# Patient Record
Sex: Male | Born: 1935 | Race: White | Hispanic: No | Marital: Married | Smoking: Never smoker
Health system: Southern US, Community
[De-identification: ages and names within clinical notes are randomized; demographics above are authoritative.]

## PROBLEM LIST (undated history)

## (undated) DIAGNOSIS — M5430 Sciatica, unspecified side: Secondary | ICD-10-CM

## (undated) DIAGNOSIS — E119 Type 2 diabetes mellitus without complications: Secondary | ICD-10-CM

## (undated) DIAGNOSIS — R5383 Other fatigue: Secondary | ICD-10-CM

## (undated) DIAGNOSIS — I493 Ventricular premature depolarization: Secondary | ICD-10-CM

## (undated) DIAGNOSIS — Z92241 Personal history of systemic steroid therapy: Secondary | ICD-10-CM

## (undated) DIAGNOSIS — IMO0002 Reserved for concepts with insufficient information to code with codable children: Secondary | ICD-10-CM

## (undated) DIAGNOSIS — I2510 Atherosclerotic heart disease of native coronary artery without angina pectoris: Secondary | ICD-10-CM

## (undated) DIAGNOSIS — I10 Essential (primary) hypertension: Secondary | ICD-10-CM

## (undated) DIAGNOSIS — N210 Calculus in bladder: Secondary | ICD-10-CM

## (undated) DIAGNOSIS — I359 Nonrheumatic aortic valve disorder, unspecified: Secondary | ICD-10-CM

## (undated) DIAGNOSIS — R011 Cardiac murmur, unspecified: Secondary | ICD-10-CM

## (undated) DIAGNOSIS — F419 Anxiety disorder, unspecified: Secondary | ICD-10-CM

## (undated) DIAGNOSIS — D649 Anemia, unspecified: Secondary | ICD-10-CM

## (undated) DIAGNOSIS — G8929 Other chronic pain: Secondary | ICD-10-CM

## (undated) DIAGNOSIS — N400 Enlarged prostate without lower urinary tract symptoms: Secondary | ICD-10-CM

## (undated) DIAGNOSIS — E785 Hyperlipidemia, unspecified: Secondary | ICD-10-CM

## (undated) DIAGNOSIS — M545 Low back pain, unspecified: Secondary | ICD-10-CM

## (undated) DIAGNOSIS — M069 Rheumatoid arthritis, unspecified: Secondary | ICD-10-CM

## (undated) HISTORY — DX: Hyperlipidemia, unspecified: E78.5

## (undated) HISTORY — PX: HAMMER TOE SURGERY: SHX385

## (undated) HISTORY — DX: Reserved for concepts with insufficient information to code with codable children: IMO0002

## (undated) HISTORY — DX: Ventricular premature depolarization: I49.3

## (undated) HISTORY — DX: Other fatigue: R53.83

## (undated) HISTORY — PX: TRANSTHORACIC ECHOCARDIOGRAM: SHX275

## (undated) HISTORY — DX: Nonrheumatic aortic valve disorder, unspecified: I35.9

## (undated) HISTORY — DX: Sciatica, unspecified side: M54.30

## (undated) HISTORY — DX: Essential (primary) hypertension: I10

## (undated) HISTORY — DX: Atherosclerotic heart disease of native coronary artery without angina pectoris: I25.10

---

## 1998-12-29 ENCOUNTER — Encounter: Payer: Self-pay | Admitting: *Deleted

## 1998-12-29 ENCOUNTER — Ambulatory Visit (HOSPITAL_COMMUNITY): Admission: RE | Admit: 1998-12-29 | Discharge: 1998-12-29 | Payer: Self-pay | Admitting: *Deleted

## 1999-08-15 HISTORY — PX: RETINAL DETACHMENT SURGERY: SHX105

## 2000-12-06 ENCOUNTER — Encounter: Admission: RE | Admit: 2000-12-06 | Discharge: 2000-12-06 | Payer: Self-pay | Admitting: Family Medicine

## 2001-09-05 ENCOUNTER — Ambulatory Visit (HOSPITAL_COMMUNITY): Admission: RE | Admit: 2001-09-05 | Discharge: 2001-09-05 | Payer: Self-pay | Admitting: Gastroenterology

## 2001-09-11 ENCOUNTER — Ambulatory Visit (HOSPITAL_BASED_OUTPATIENT_CLINIC_OR_DEPARTMENT_OTHER): Admission: RE | Admit: 2001-09-11 | Discharge: 2001-09-11 | Payer: Self-pay | Admitting: Urology

## 2001-09-11 HISTORY — PX: OTHER SURGICAL HISTORY: SHX169

## 2002-06-05 ENCOUNTER — Ambulatory Visit (HOSPITAL_COMMUNITY): Admission: RE | Admit: 2002-06-05 | Discharge: 2002-06-05 | Payer: Self-pay | Admitting: Orthopedic Surgery

## 2002-06-05 ENCOUNTER — Encounter: Payer: Self-pay | Admitting: Orthopedic Surgery

## 2003-08-15 HISTORY — PX: CARDIAC CATHETERIZATION: SHX172

## 2003-12-23 ENCOUNTER — Inpatient Hospital Stay (HOSPITAL_BASED_OUTPATIENT_CLINIC_OR_DEPARTMENT_OTHER): Admission: RE | Admit: 2003-12-23 | Discharge: 2003-12-23 | Payer: Self-pay | Admitting: Cardiology

## 2004-02-16 ENCOUNTER — Encounter: Admission: RE | Admit: 2004-02-16 | Discharge: 2004-02-16 | Payer: Self-pay | Admitting: Family Medicine

## 2004-11-11 ENCOUNTER — Ambulatory Visit (HOSPITAL_COMMUNITY): Admission: RE | Admit: 2004-11-11 | Discharge: 2004-11-11 | Payer: Self-pay | Admitting: Orthopedic Surgery

## 2006-09-24 ENCOUNTER — Ambulatory Visit (HOSPITAL_BASED_OUTPATIENT_CLINIC_OR_DEPARTMENT_OTHER): Admission: RE | Admit: 2006-09-24 | Discharge: 2006-09-24 | Payer: Self-pay | Admitting: Orthopedic Surgery

## 2006-09-24 HISTORY — PX: CARPAL TUNNEL RELEASE: SHX101

## 2006-11-07 ENCOUNTER — Ambulatory Visit (HOSPITAL_COMMUNITY): Admission: RE | Admit: 2006-11-07 | Discharge: 2006-11-07 | Payer: Self-pay | Admitting: Orthopedic Surgery

## 2007-11-17 HISTORY — PX: OTHER SURGICAL HISTORY: SHX169

## 2007-12-05 ENCOUNTER — Inpatient Hospital Stay (HOSPITAL_COMMUNITY): Admission: EM | Admit: 2007-12-05 | Discharge: 2007-12-10 | Payer: Self-pay | Admitting: Emergency Medicine

## 2008-08-26 ENCOUNTER — Ambulatory Visit (HOSPITAL_COMMUNITY): Admission: RE | Admit: 2008-08-26 | Discharge: 2008-08-26 | Payer: Self-pay | Admitting: Orthopedic Surgery

## 2008-10-07 ENCOUNTER — Inpatient Hospital Stay (HOSPITAL_COMMUNITY): Admission: RE | Admit: 2008-10-07 | Discharge: 2008-10-12 | Payer: Self-pay | Admitting: Orthopedic Surgery

## 2008-10-07 HISTORY — PX: LUMBAR SPINE SURGERY: SHX701

## 2008-12-15 IMAGING — CR DG KNEE COMPLETE 4+V*R*
4 series · 4 of 4 positions shown · non-contrast
Comparison: left knee exam, same date

CLINICAL DATA: Knee pain, recent fall

RIGHT KNEE - COMPLETE 4+ VIEW

[t knee ap right]
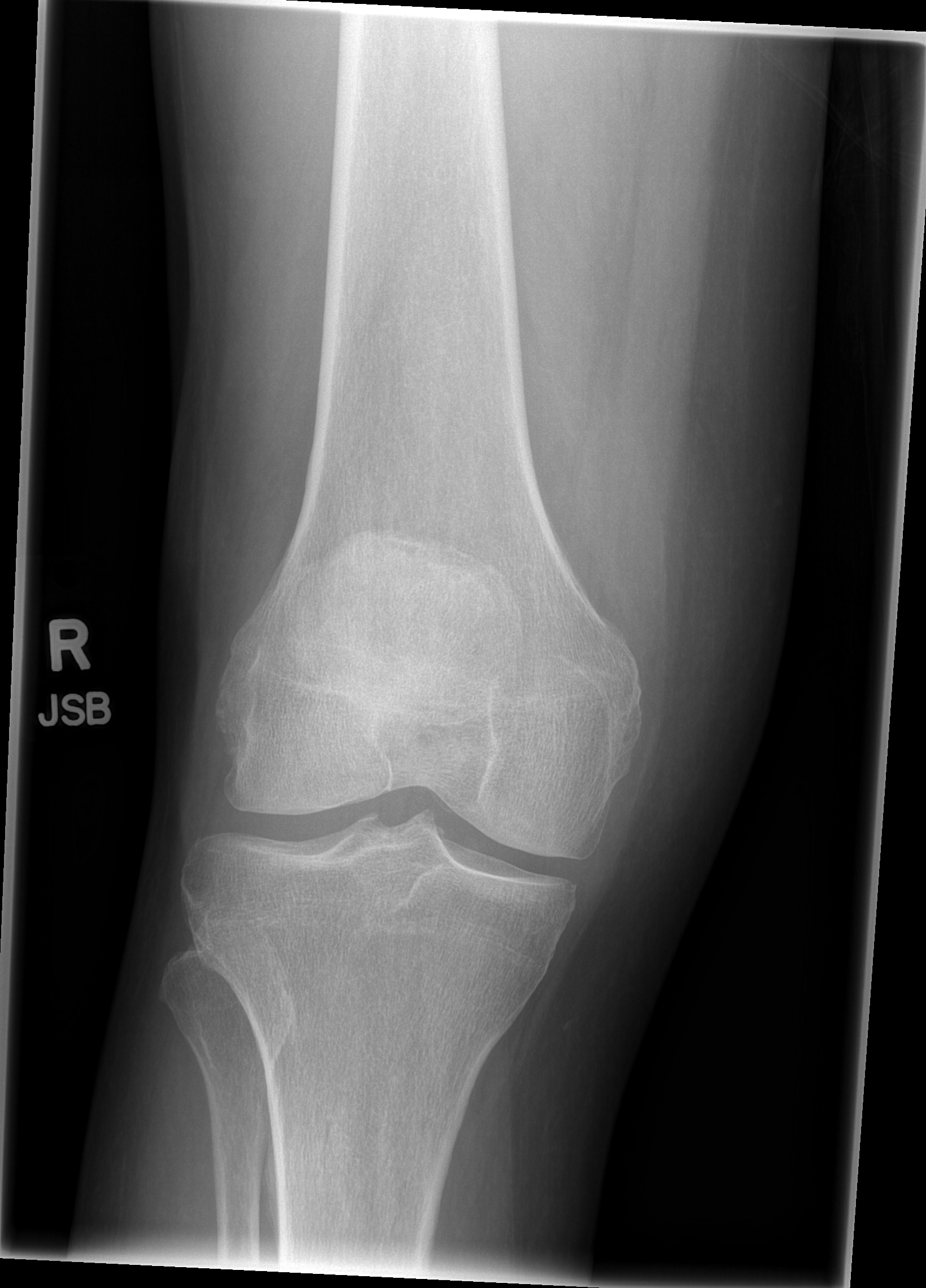

[t knee oblique right (1 of 2)]
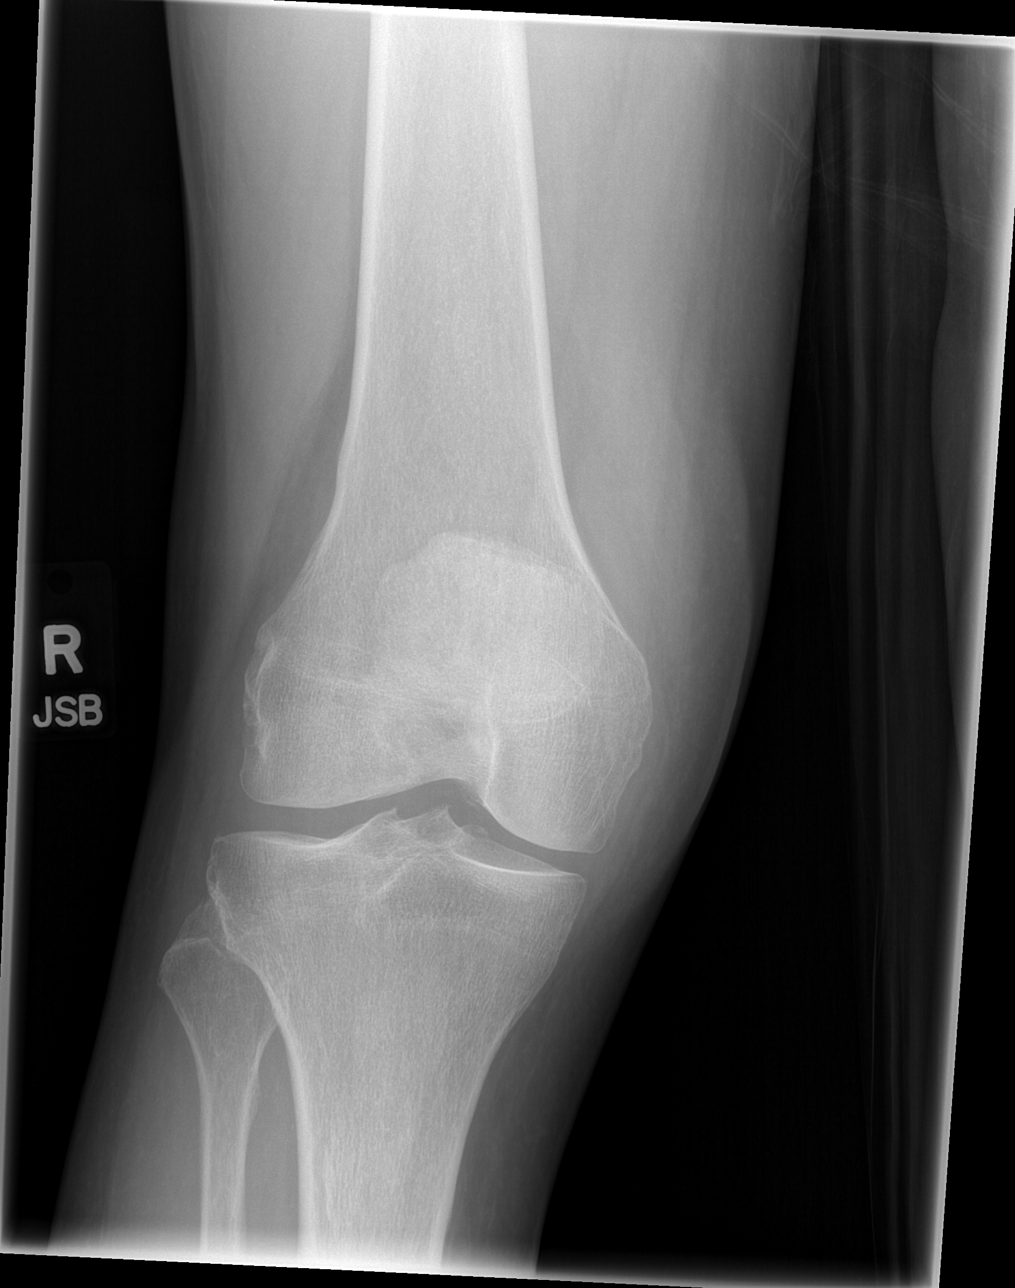

[t knee oblique right (2 of 2)]
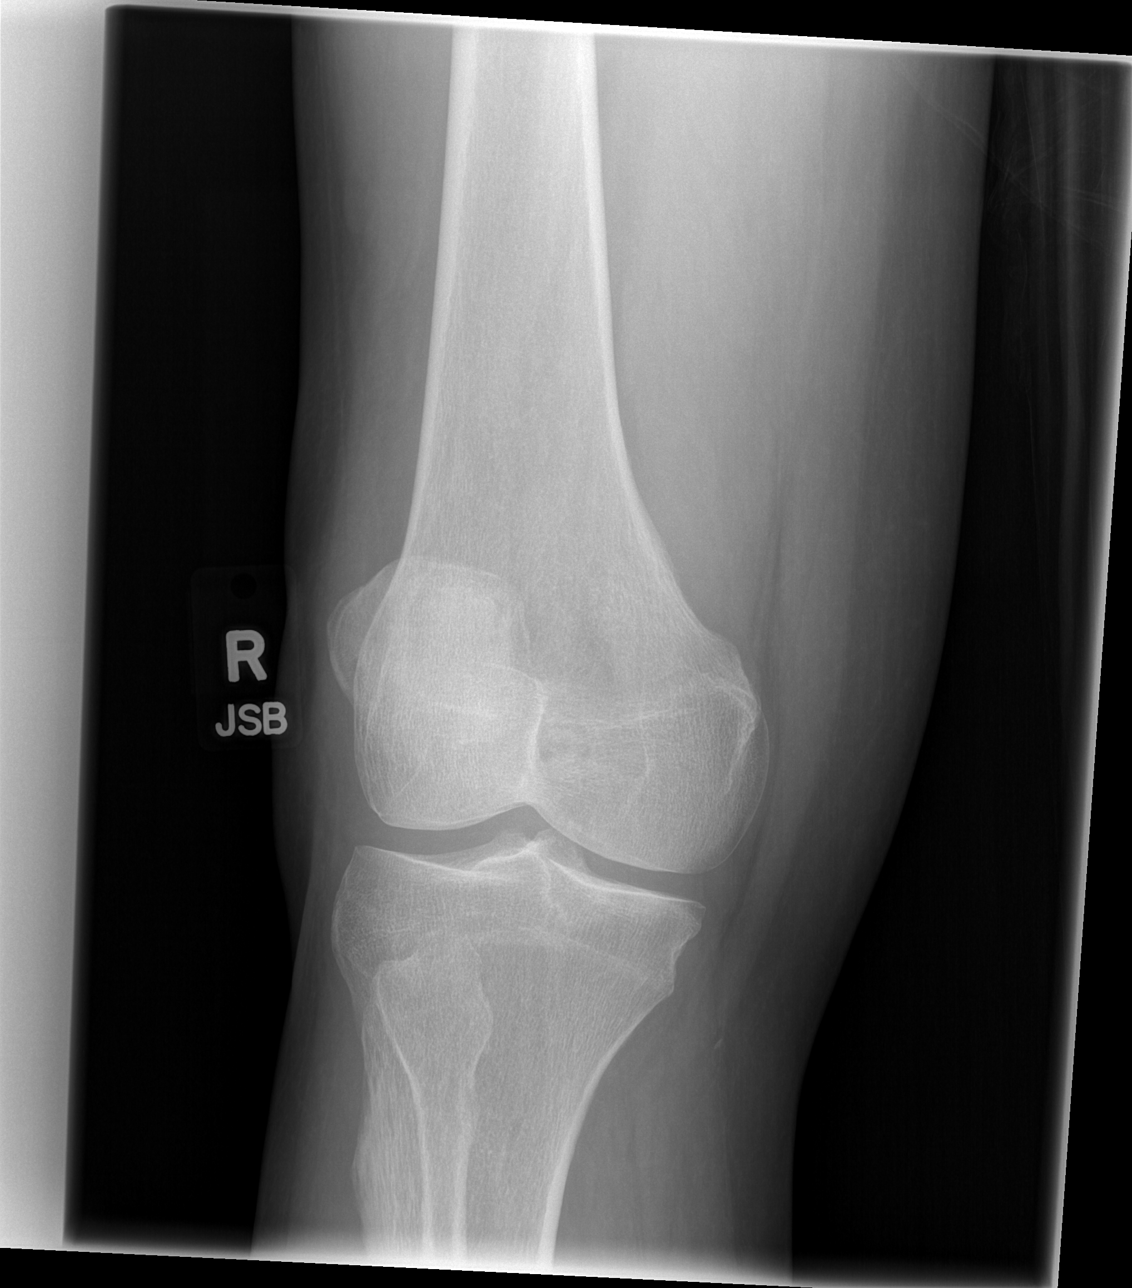

[t knee lat right]
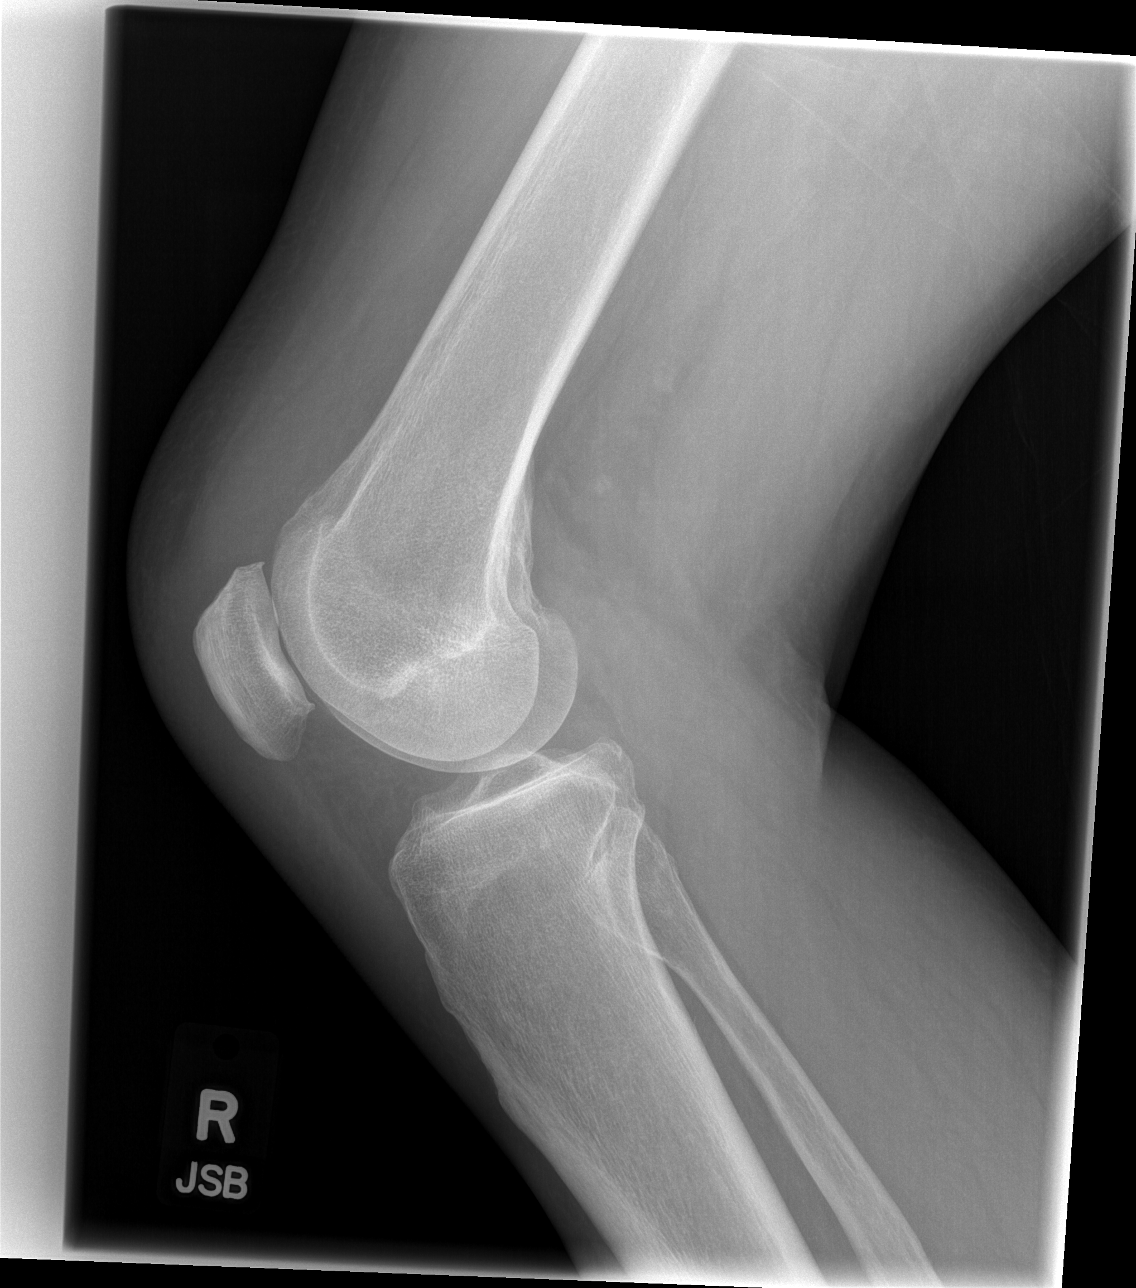

[4 of 4 positions shown; findings below may reference images not displayed]

FINDINGS: Normal alignment without displaced fracture.  Lateral
view also demonstrates prepatellar anterior knee soft tissue
swelling.  A small joint effusion is not entirely excluded.
IMPRESSION: No acute fracture.

Prepatellar anterior knee soft tissue swelling.

## 2008-12-15 IMAGING — CR DG KNEE COMPLETE 4+V*L*
4 series · 4 of 4 positions shown · non-contrast
Comparison: None.

CLINICAL DATA: Knee pain, recent fall

LEFT KNEE - COMPLETE 4+ VIEW

[t knee ap left]
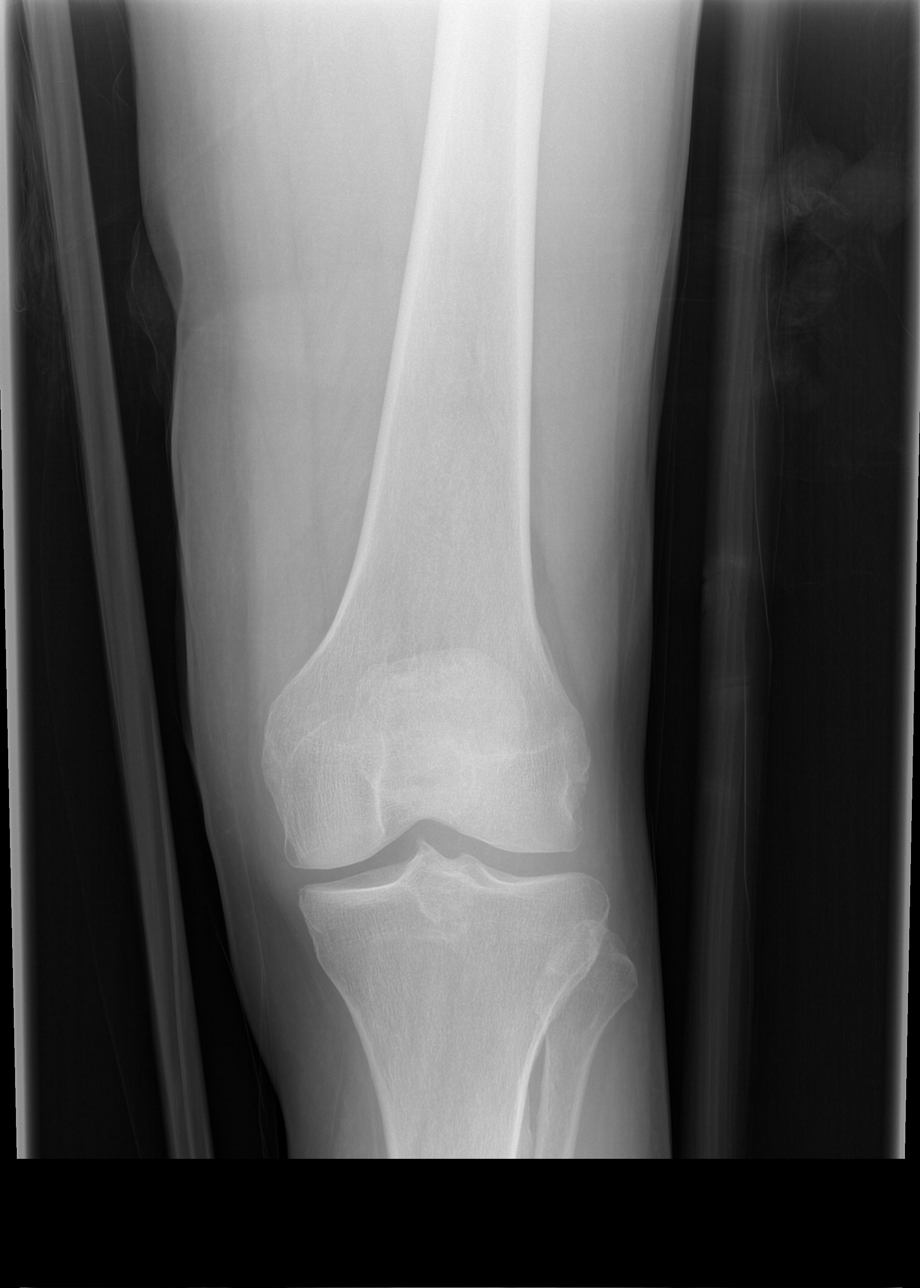

[t knee oblique left (1 of 2)]
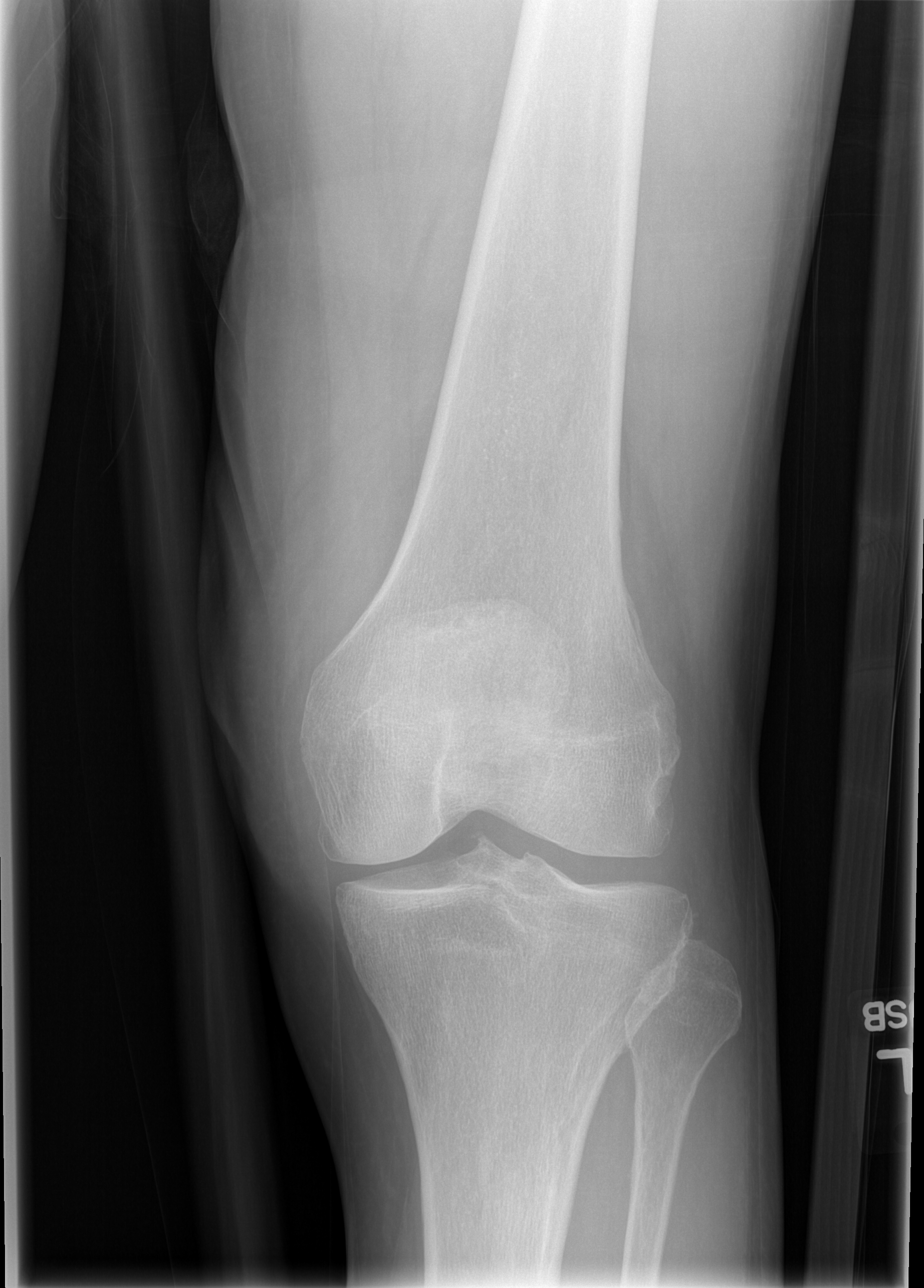

[t knee oblique left (2 of 2)]
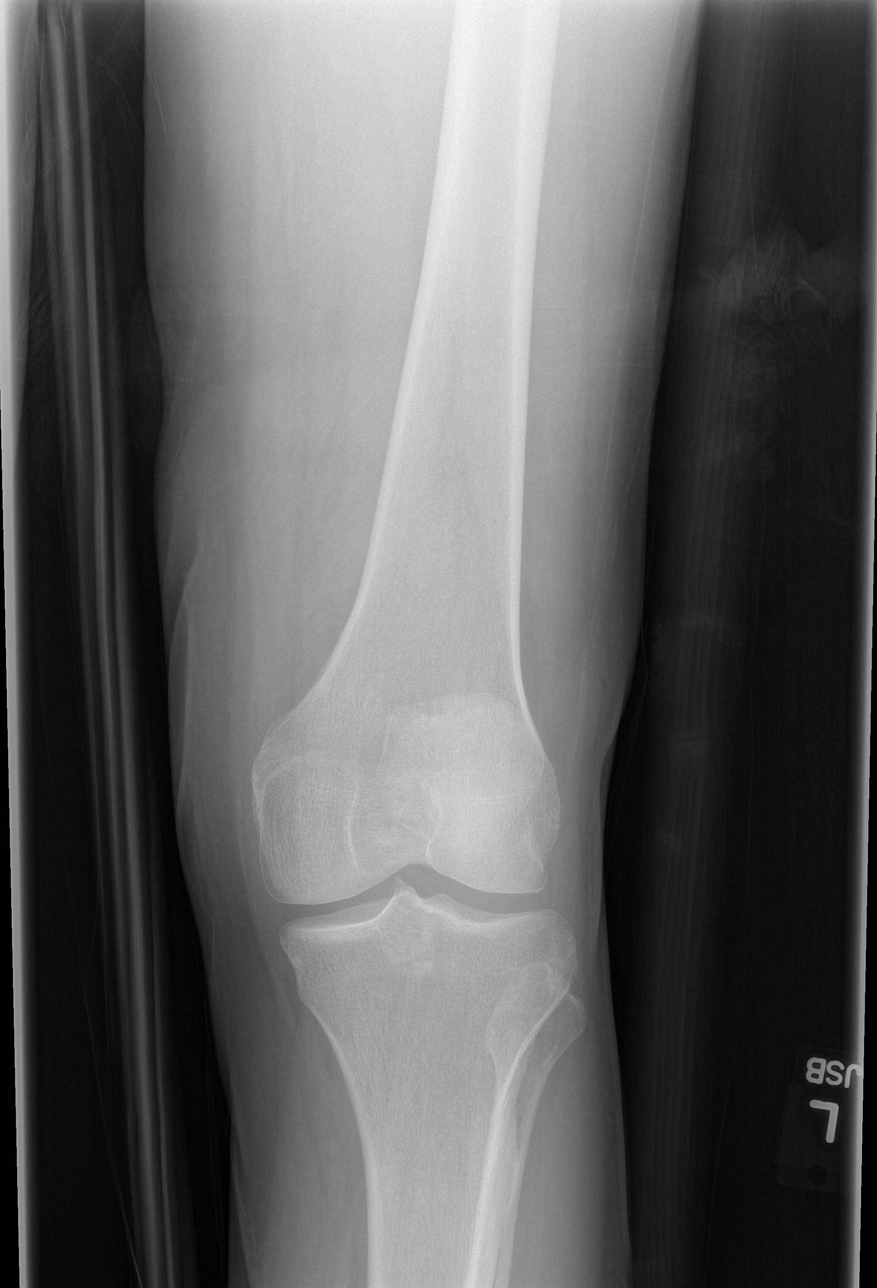

[t knee lat left]
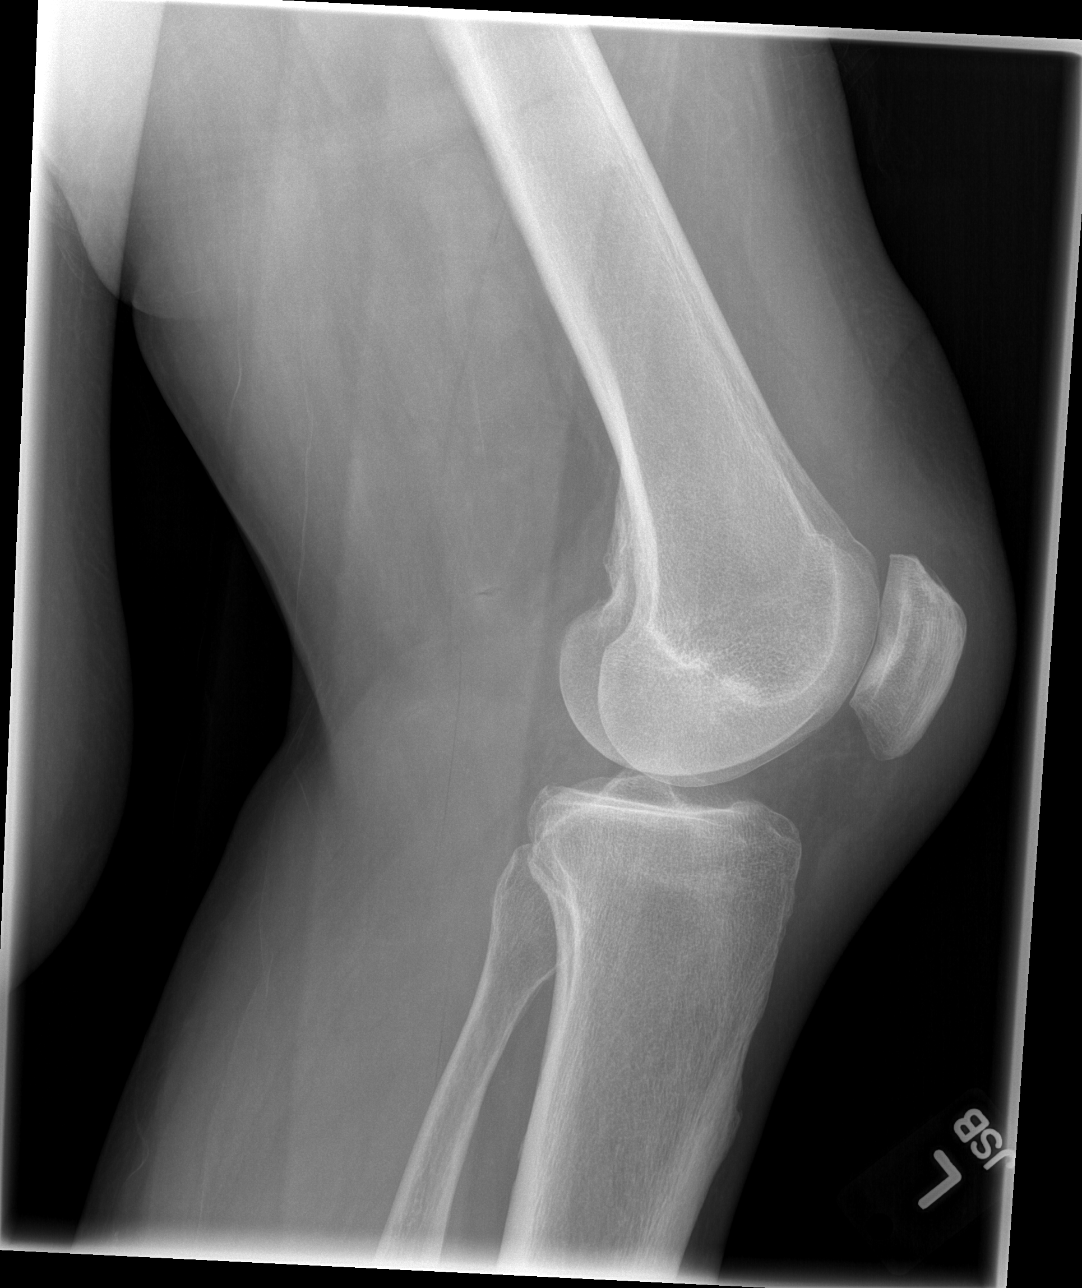

[4 of 4 positions shown; findings below may reference images not displayed]

FINDINGS: Normal alignment without fracture.  Lateral view
demonstrates prepatellar soft tissue swelling.  A small joint
effusion is not entirely excluded.
IMPRESSION: No acute bony abnormality.

Question small joint effusion

Prepatellar anterior soft tissue swelling.

## 2008-12-15 IMAGING — CR DG CHEST 1V PORT
1 series · 1 of 1 positions shown · non-contrast
Comparison: The chest 02/16/2004

CLINICAL DATA: Preop tendon repair

PORTABLE CHEST - 1 VIEW

[view not recorded]
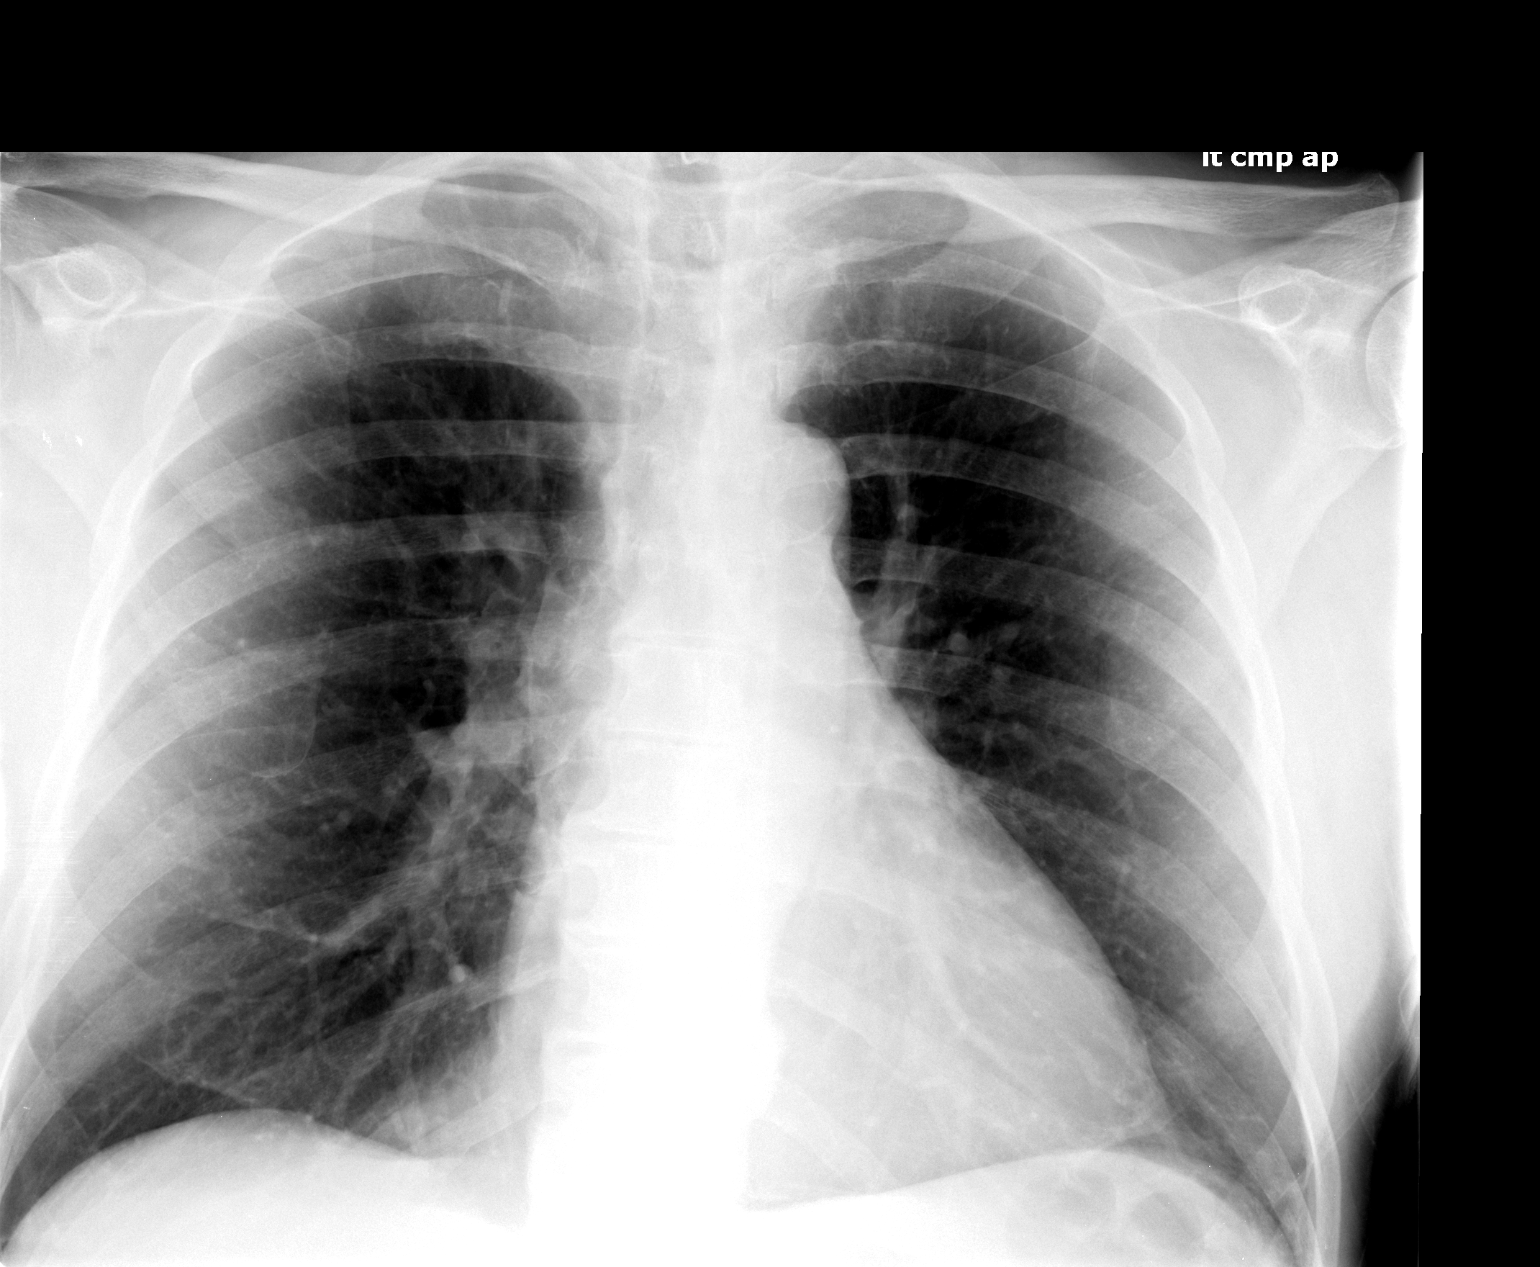

[1 of 1 positions shown; findings below may reference images not displayed]

FINDINGS: Heart size is upper normal.  Negative for heart failure.
There is no infiltrate, effusion, or mass lesion.  There are mild
changes of COPD.
IMPRESSION: No active cardiopulmonary disease.

## 2010-05-20 ENCOUNTER — Ambulatory Visit: Payer: Self-pay | Admitting: Cardiology

## 2010-05-20 ENCOUNTER — Ambulatory Visit (HOSPITAL_COMMUNITY): Admission: RE | Admit: 2010-05-20 | Discharge: 2010-05-20 | Payer: Self-pay | Admitting: Orthopedic Surgery

## 2010-05-24 ENCOUNTER — Ambulatory Visit: Payer: Self-pay | Admitting: Cardiology

## 2010-09-04 ENCOUNTER — Encounter: Payer: Self-pay | Admitting: Family Medicine

## 2010-09-17 ENCOUNTER — Observation Stay (HOSPITAL_COMMUNITY)
Admission: EM | Admit: 2010-09-17 | Discharge: 2010-09-18 | Disposition: A | Discharge status: Home / Self Care | Payer: Medicare Other | Attending: Orthopedic Surgery | Admitting: Orthopedic Surgery

## 2010-09-17 ENCOUNTER — Emergency Department (HOSPITAL_COMMUNITY): Payer: Medicare Other

## 2010-09-17 DIAGNOSIS — S61209A Unspecified open wound of unspecified finger without damage to nail, initial encounter: Secondary | ICD-10-CM | POA: Insufficient documentation

## 2010-09-17 DIAGNOSIS — IMO0002 Reserved for concepts with insufficient information to code with codable children: Secondary | ICD-10-CM | POA: Insufficient documentation

## 2010-09-17 DIAGNOSIS — I10 Essential (primary) hypertension: Secondary | ICD-10-CM | POA: Insufficient documentation

## 2010-09-17 DIAGNOSIS — W312XXA Contact with powered woodworking and forming machines, initial encounter: Secondary | ICD-10-CM | POA: Insufficient documentation

## 2010-09-17 DIAGNOSIS — G8929 Other chronic pain: Secondary | ICD-10-CM | POA: Insufficient documentation

## 2010-09-17 DIAGNOSIS — Z79899 Other long term (current) drug therapy: Secondary | ICD-10-CM | POA: Insufficient documentation

## 2010-09-17 DIAGNOSIS — Z23 Encounter for immunization: Secondary | ICD-10-CM | POA: Insufficient documentation

## 2010-09-17 DIAGNOSIS — M069 Rheumatoid arthritis, unspecified: Secondary | ICD-10-CM | POA: Insufficient documentation

## 2010-09-17 DIAGNOSIS — E119 Type 2 diabetes mellitus without complications: Secondary | ICD-10-CM | POA: Insufficient documentation

## 2010-09-17 LAB — BASIC METABOLIC PANEL
Calcium: 9 mg/dL (ref 8.4–10.5)
Creatinine, Ser: 0.79 mg/dL (ref 0.4–1.5)
GFR calc Af Amer: >60 mL/min (ref >60)
GFR calc non Af Amer: >60 mL/min (ref >60)
Glucose, Bld: 123 mg/dL — ABNORMAL HIGH (ref 70–99)
Sodium: 136 mEq/L (ref 135–145)

## 2010-09-17 LAB — CBC
MCH: 29.5 pg (ref 26.0–34.0)
MCHC: 34 g/dL (ref 30.0–36.0)
RDW: 12.5 % (ref 11.5–15.5)

## 2010-09-18 HISTORY — PX: OTHER SURGICAL HISTORY: SHX169

## 2010-09-18 LAB — GLUCOSE, CAPILLARY: Glucose-Capillary: 102 mg/dL — ABNORMAL HIGH (ref 70–99)

## 2010-09-25 NOTE — Op Note (Addendum)
NAMEKEARY, [name]                 ACCOUNT NO.:  [id number]  MEDICAL RECORD NO.:  [id number]           PATIENT TYPE:  I  LOCATION:  2550                         FACILITY:  [facility]  PHYSICIAN:  Betha Loa, MD        DATE OF BIRTH:  02/06/1936  DATE OF PROCEDURE:  09/18/2010 DATE OF DISCHARGE:  09/18/2010                              OPERATIVE REPORT   PREOPERATIVE DIAGNOSIS:  Left index and long finger table saw injuries.  POSTOPERATIVE DIAGNOSES:  Left index finger nail bed laceration, left long finger open P2 fracture, and extensor tendon laceration.  PROCEDURES:   1. Irrigation and debridement of index and long finger lacerations 2. Irrigation and debridement of open long finger middle phalanx fracture 3. Repair of long finger extensor tendon laceration zone 2 4. Percutaneous pinning of distal interphalangeal joint and middle phalanx fracture 5. Repair of index finger nail bed laceration.  SURGEON:  Betha Loa, MD  ASSISTANT:  None.  ANESTHESIA:  General.  INTRAVENOUS FLUIDS:  Per anesthesia flow sheet.  ESTIMATED BLOOD LOSS:  Minimal.  COMPLICATIONS:  None.  SPECIMENS:  None.  TOURNIQUET TIME:  62 minutes.  DISPOSITION:  Stable to PACU.  INDICATIONS:  [name] is a 75 year old right-hand-dominant white male who was using a table saw approximately 8:30 on September 17, 2010, when his hand was kicked into the blade.  He had lacerations to the index and long finger.  He was brought to the [facility] Emergency Department where he was evaluated.  Radiographs were taken revealing a fracture of the long finger middle phalanx.  I was consulted for management of the injury.  On examination, he had intact sensation and capillary refill on all fingertips.  He was unable to extend at the DIP joint and long finger.  He noted pain in the index finger and there was laceration at the dorsal aspect of the nail fold as well as at the tip of the finger. I recommended to Mr.  [name] going to the operating room for irrigation and debridement of his open fracture and repair of the extensor tendon. Risks, benefits, and alternatives of surgery were discussed including the risks of blood loss, infection, damage to nerves, vessels, tendons, ligaments, and bone, failure of surgery, the need for additional surgery, complications with wound healing, continued pain, nonunion, malunion, and stiffness.  He voiced understanding these risks and elected to proceed.  OPERATIVE COURSE:  After being identified preoperatively by myself, the patient and I agreed upon the procedure and site of procedure.  The surgical site was marked.  The risks, benefits, and alternatives of surgery were reviewed and he wished to proceed.  Surgical consent had been signed.  He had been given 1 g of IV Ancef in the emergency department.  He was transferred to the operating room and placed on the operating table in supine position with the left upper extremity on an arm board.  General anesthesia was induced by the anesthesiologist.  The left upper extremity was prepped and draped in a normal sterile orthopedic fashion.  A surgical pause was performed between surgeons, Anesthesia, and  operating staff and all were in agreement as to the patient, procedure, and site of procedure.  Tourniquet on the proximal aspect of the extremity was inflated to 250 mmHg after exsanguination of the limb with an Esmarch bandage.  The wounds were explored.  There was noted to be a nail bed laceration at the distal aspect of the nail bed of the index finger.  The ulnar side was a little bit damaged as well. The ring finger had a very superficial laceration on it.  The laceration on the long finger went down into the bone.  It was cleared of clot and hematoma.  Volar cortex of the middle phalanx was noted to be intact. The extensor tendon was 100% lacerated.  The neurovascular structure was outside the zone of injury on  the ulnar side.  On the radial side, it was very close.  The radial digital nerve and artery were identified and were noted to be intact.  The wounds were all copiously irrigated with 1000 mL of sterile saline by bulb syringe.  There was no gross contamination.  The DIP joint of the long finger was pinned with a 0.45- inch K-wire to take tension off the tensor tendon repair.  C-arm was used in AP and lateral projections to ensure appropriate placement of the pin which was the case.  The extensor tendon was then repaired with 4-0 Mersilene suture in a figure-of-eight fashion.  This was adequate to oppose the tendon edges.  The wound had been extended proximally on the radial side and distally on the ulnar side to gain exposure.  The nail was removed from the index finger.  There was noted to be laceration of the nail bed at the distal radial edge, but not anymore proximally.  A 6- 0 chromic gut suture was used to repair the nail bed laceration and the small portion of skin laceration at the tip of the finger.  The wound on the long finger was repaired with 5-0 nylon in a horizontal mattress fashion.  A piece of Xeroform was placed in the nail fold of the index finger.  The pin site was dressed with a Xeroform.  The pin had been bent and cut short and pin cap placed.  The wounds were all dressed with sterile Xeroform and 4 x 4's and wrapped with a Kerlix bandage.  A volar and dorsal splint was placed with the index, long, and ring fingers included.  The MPs were flex and the IPs were extended.  This was wrapped with Kerlix and Ace bandage.  The tourniquet was deflated at 62 minutes.  The fingertips were all pink with brisk capillary refill after deflation of the tourniquet.  The operative drapes were broken down and the patient was awoken from anesthesia safely.  He was transferred back to the stretcher and taken to PACU in stable condition.  He will get one more dose of IV Ancef in the  recovery area and he will be discharged home with a prescription for Percocet and doxycycline.  I will see him back in the office in 1 week for postoperative followup.     Betha Loa, MD     KK/MEDQ  D:  09/18/2010  T:  09/19/2010  Job:  [id number]  Electronically Signed by Betha Loa  on 09/22/2010 07:29:44 PM

## 2010-09-25 NOTE — H&P (Signed)
[name], [name]                 ACCOUNT NO.:  [id number]  MEDICAL RECORD NO.:  [id number]           PATIENT TYPE:  I  LOCATION:  2550                         FACILITY:  [facility]  PHYSICIAN:  Betha Loa, MD        DATE OF BIRTH:  Mar 25, 1936  DATE OF ADMISSION:  09/17/2010 DATE OF DISCHARGE:  09/18/2010                             HISTORY & PHYSICAL   CHIEF COMPLAINT:  Table saw injury, left hand.  HISTORY OF PRESENT ILLNESS:  Mr. Elmquist is a 75 year old right-hand- dominant white male who states he was using a table saw approximately 8:30 p.m. today when his left hand was knocked into the blade.  He thinks the left long finger hit the blade and the index finger was hit by the piece of wood.  He had lacerations to the dorsum of both fingers. He is unable to extend at the DIP joint of the long finger.  He came to [facility] Emergency Department where he was evaluated.  I was consulted for management of the injury.  He reports no other injuries and no previous injuries.  He states the index finger hurts more than the long finger.  His tetanus was updated and he was given a gram of IV Ancef by the emergency department staff.  ALLERGIES:  SULFA DRUGS caused kidney problems when he was a child.  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  PAST SURGICAL HISTORY:  Hydrocele repair, retinal detachment, spinal decompression of L-spine, carpal tunnel release on the right hand, right foot toe surgery, and left knee quad repair.  MEDICATIONS:  Cymbalta, leflunomide, metformin, Norvasc, Vicodin, fentanyl, lisinopril, and prednisone.  FAMILY HISTORY:  Negative.  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  He does not smoke and does not use alcohol.  REVIEW OF SYSTEMS:  A 13-point review of systems is negative.  PHYSICAL EXAMINATION:  VITAL SIGNS:  Temperature 98.2, pulse  67, respirations 20, and BP 190/80. GENERAL:  Alert and oriented x3. HEAD:  Normocephalic and atraumatic. NECK:  Supple.  Full range of motion. CHEST:  Regular rate and rhythm. LUNGS:  Clear to auscultation bilaterally. ABDOMEN:  Nontender and nondistended. EXTREMITIES:  Bilateral upper extremities are distally neurovascularly intact in radial, median, and ulnar nerve distributions.  Light touch sensation is intact and he has good capillary refill.  He can flex and extend the IP joints of his thumbs and can cross his fingers.  The right upper extremity has no wounds and no tenderness to palpation.  In the left upper extremity with the exception of the index and long finger, he has no wounds and no tenderness to palpation.  In the index finger, he has intact sensation and capillary refill in both the radial and ulnar side of the fingertip.  There are lacerations in the dorsal aspect of the distal phalanx over the nail fold and at the tip.  He has active flexion/extension of the DIP joint of the index finger.  In the long finger, he has intact sensation and capillary  fill on both the radial and ulnar sides of the finger.  There is good capillary refill under the nail.  There is a laceration just proximal to the DIP joint.  He is unable to extend at the DIP joint.  He has active flexion at the DIP and PIP joints.  RADIOGRAPHS:  AP, lateral, and oblique views of left hand show bone loss at the dorsal aspect of the distal middle phalanx of the long finger. It appears that the volar cortex may still be intact.  No other fractures, dislocations, or radiopaque foreign bodies are noted.  LAB RESULTS:  White blood count 6.8, hemoglobin 14.4, hematocrit 42.3, and platelets 224.  ASSESSMENT/PLAN:  Table saw injuries to left long and index finger with apparent laceration to the dorsal index finger and laceration of the skin and extensor tendon of the long finger with loss of dorsal cortex of  the distal aspect of the middle phalanx.  I discussed with Mr. Urwin and his wife the nature of his injuries.  I recommended going to the operating room for irrigation and debridement of his wounds and the open fracture.  We discussed that he may require percutaneous pinning of the fingers.  Will require extensor tendon repair which could require rotation of tissue or a graft in the future.  We will also have to keep an eye on the bone loss and decide whether to bone graft this in the future once we are sure that there is no infection in the tissues. Risks, benefits, and alternatives of surgery were discussed including the risk of blood loss, infection, damage to nerves, vessels, tendons, ligaments, and bone, failure of procedure, need for additional procedures, complications with wound healing, continued pain, and stiffness.  He voiced these risks and elected to proceed.  We will have the surgery arranged as soon as possible.     Betha Loa, MD     KK/MEDQ  D:  09/17/2010  T:  09/18/2010  Job:  [id number]  Electronically Signed by Betha Loa  on 09/22/2010 07:26:45 PM

## 2010-09-30 ENCOUNTER — Other Ambulatory Visit (INDEPENDENT_AMBULATORY_CARE_PROVIDER_SITE_OTHER): Payer: Medicare Other

## 2010-09-30 DIAGNOSIS — Z79899 Other long term (current) drug therapy: Secondary | ICD-10-CM

## 2010-09-30 DIAGNOSIS — E119 Type 2 diabetes mellitus without complications: Secondary | ICD-10-CM

## 2010-10-05 ENCOUNTER — Ambulatory Visit (INDEPENDENT_AMBULATORY_CARE_PROVIDER_SITE_OTHER): Payer: Medicare Other | Admitting: Cardiology

## 2010-10-05 DIAGNOSIS — M069 Rheumatoid arthritis, unspecified: Secondary | ICD-10-CM

## 2010-10-05 DIAGNOSIS — E119 Type 2 diabetes mellitus without complications: Secondary | ICD-10-CM

## 2010-10-05 DIAGNOSIS — I10 Essential (primary) hypertension: Secondary | ICD-10-CM

## 2010-10-05 DIAGNOSIS — E7800 Pure hypercholesterolemia, unspecified: Secondary | ICD-10-CM

## 2010-11-14 ENCOUNTER — Other Ambulatory Visit: Payer: Self-pay | Admitting: Cardiology

## 2010-11-14 DIAGNOSIS — E119 Type 2 diabetes mellitus without complications: Secondary | ICD-10-CM

## 2010-11-29 LAB — BASIC METABOLIC PANEL
CO2: 30 mEq/L (ref 19–32)
Calcium: 9.3 mg/dL (ref 8.4–10.5)
Creatinine, Ser: 1.2 mg/dL (ref 0.4–1.5)
GFR calc Af Amer: >60 mL/min (ref >60)
GFR calc non Af Amer: 60 mL/min — ABNORMAL LOW (ref >60)
Glucose, Bld: 173 mg/dL — ABNORMAL HIGH (ref 70–99)
Sodium: 142 mEq/L (ref 135–145)

## 2010-11-29 LAB — CBC
HCT: 32.9 % — ABNORMAL LOW (ref 39.0–52.0)
MCHC: 33.8 g/dL (ref 30.0–36.0)
MCV: 89.2 fL (ref 78.0–100.0)
Platelets: 195 10*3/uL (ref 150–400)
RDW: 12.7 % (ref 11.5–15.5)
WBC: 13.9 10*3/uL — ABNORMAL HIGH (ref 4.0–10.5)

## 2010-11-29 LAB — GLUCOSE, CAPILLARY
Glucose-Capillary: 132 mg/dL — ABNORMAL HIGH (ref 70–99)
Glucose-Capillary: 134 mg/dL — ABNORMAL HIGH (ref 70–99)
Glucose-Capillary: 143 mg/dL — ABNORMAL HIGH (ref 70–99)

## 2010-11-29 LAB — URINALYSIS, ROUTINE W REFLEX MICROSCOPIC
Bilirubin Urine: NEGATIVE
Glucose, UA: NEGATIVE mg/dL
Hgb urine dipstick: NEGATIVE
Ketones, ur: NEGATIVE mg/dL
Protein, ur: NEGATIVE mg/dL
Urobilinogen, UA: 0.2 mg/dL (ref 0.0–1.0)

## 2010-11-29 LAB — TYPE AND SCREEN
ABO/RH(D): O POS
Antibody Screen: NEGATIVE

## 2010-12-27 NOTE — Op Note (Signed)
[name], Spears                 ACCOUNT NO.:  [id number]   MEDICAL RECORD NO.:  [id number]          PATIENT TYPE:  INP   LOCATION:  5020                         FACILITY:  [facility]   PHYSICIAN:  Madlyn Frankel. Charlann Boxer, M.D.  DATE OF BIRTH:  08-25-1935   DATE OF PROCEDURE:  12/07/2007  DATE OF DISCHARGE:                               OPERATIVE REPORT   PREOPERATIVE DIAGNOSIS:  Left quadriceps tendon rupture.   POSTOPERATIVE DIAGNOSIS:  Left quadriceps tendon rupture.   PROCEDURE:  Open repair of the left quadriceps tendon using two #2  FiberWire repair, this included retinacular repair.   SURGEON:  Madlyn Frankel. Charlann Boxer, MD   ASSISTANT:  None.   ANESTHESIA:  General.   ESTIMATED BLOOD LOSS:  Minimal.   TOURNIQUET TIME:  55 minutes at 250 mmHg.   DRAINS:  None.   COMPLICATIONS:  None.   INDICATIONS FOR PROCEDURE:  [name] is a pleasant 75 year old  gentleman who was working on a house sitting in a chair, when the chair  collapsed around him.  He had a hyperflexion-type injury to his left  knee with an audible tearing inside the knee area.  He had inability to  bear weight and cannot do a straight leg raise.  He was brought to the  emergency room.  Evaluation at that time revealed a palpable defect in  the suprapatellar region with inability to perform straight leg raise.   He was subsequently admitted and operative procedure planned, but the  initial night of his admission, surgery was not able to be performed and  next available time was on the 25th.  No other reason for delay.  His  medical clearance is not necessary.   The risks and benefits of this type of procedure and treatment were  planned.  The plan was to proceed with this as a subacute type of deal  as opposed to in the outpatient basis based on social issues and need  for mobility and difficulty for getting back and forth.  Risks and  benefits were discussed and consent was obtained.   PROCEDURE IN DETAIL:  The  patient was brought to the operative theater.  Once adequate anesthesia and preoperative antibiotics administered, the  patient was positioned supine and a bump placed underneath the left hip.  The left lower extremity was trimmed and freely scrubbed and prepped and  draped in sterile fashion over a proximal thigh tourniquet.  The midline  incision was made down to the retinacular tissues creating soft tissues  flaps and sized the retinaculum and preserved it for later.  The patient  had noted than more tearing into the medial retinacular tissues with a  vertical tear from the midline medial, less of a tear on the lateral  side.  The muscle did not appear to be significantly involved.  A  palpable defect was noted in the quadriceps tendon.  I evacuated the  joint and irrigated and debrided the proximal pole of the patella.  At  this point, I passed two, #2 FiberWire sutures in a Krackow suture  pattern proximally through the  tendon and then back through leaving four  strands of suture at the end of the quadriceps tendon area.   I then now drilled 3 drill holes in the patella from proximal to distal  and passed the suture passer in order to pass the sutures through, one  medially, two centrally, and one laterally.  Once these sutures were  passed underneath the tendon, the medial tendon to the respective side,  I then applied pressure at the quadriceps tendon to the patella and  sutured the lateral side and then repaired the medial side.  I then  brought the repaired suture in fact each other in the midline and then  sutured these together.  At this point, I repaired the vertical slit in  the tendon and placed it from suture passing with 0 Vicryl.   Vicryl +1 was used in a running fashion to reapproximate the medial and  lateral retinacular tears from medial to midline and lateral to midline.  I used 0 Vicryl to continue the retinacular repair in the midline.  At  this point, I  reapproximated the subcu layer with 2-0 Vicryl and used a  4-0 Monocryl on the skin.  I augmented the 4-0 Monocryl repair with a  Dermabond.   At this point, the tourniquet was let down, and a sterile dressing was  applied.  A bulky drape applied.  A knee immobilizer was reapplied.  The  patient was extubated and brought to the recovery room in stable  condition tolerating the procedure well.   He will be admitted to the hospital with a planned discharge for Monday.      Madlyn Frankel Charlann Boxer, M.D.  Electronically Signed     MDO/MEDQ  D:  12/07/2007  T:  12/08/2007  Job:  [id number]

## 2010-12-27 NOTE — Op Note (Signed)
NAMETIWAN, SCHNITKER                 ACCOUNT NO.:  [id number]   MEDICAL RECORD NO.:  [id number]          PATIENT TYPE:  INP   LOCATION:  1535                         [facility]ACILITY:  [facility]   PHYSICIAN:  Marlowe Kays, M.D.  DATE OF BIRTH:  1936/04/14   DATE OF PROCEDURE:  10/07/2008  DATE OF DISCHARGE:                               OPERATIVE REPORT   PREOPERATIVE DIAGNOSIS:  Spinal and foraminal stenosis L2 to the sacrum.   POSTOPERATIVE DIAGNOSIS:  Spinal and foraminal stenosis L2 to the  sacrum.   OPERATION:  Central foraminal decompression L2-3, L3-4, L4-5 and L5-S1.   SURGEON:  Marlowe Kays, M.D.   ASSISTANT:  Worthy Rancher, M.D.   ANESTHESIA:  General.   PATHOLOGY AND JUSTIFICATION FOR PROCEDURE:  He has had a long history of  progressive back and primarily left leg pain with an MRI on September 01, 2008, demonstrating the above preoperative diagnoses with the stenosis  worse at L3-4 and L4-5 and more foraminal stenosis at L2-3 and L5-S1.  Because of the progressive severity of his symptoms, he is here today  for the above surgery.   PROCEDURE:  Prophylactic antibiotics, satisfactory general anesthesia,  Foley catheter inserted, prone position on the Wilson frame.  The back  was prepped with DuraPrep, draped in a sterile field, time-out  performed.  I made a vertical midline incision and tagged the spinous  processes at the superior and inferior portions of the incision, with a  lateral x-ray demonstrating clamps from L3 and L4 and, accordingly, I  extended the incision a little proximal-ward and distal-ward and took a  second x-ray, with the adjacent spinous processes in case tagged,  confirming that were on L2 and L5.  Based on this, I continued  dissecting the soft tissue off the neural arches in this interval and  placed two self-retaining McCullough retractors.  With a double-action  rongeur, I removed a major portion of the spinous process of L2 as well  as the neural  arch, working all the way down to the sacrum, and when we  had gotten down to the ligamentum flavum in most areas we brought in the  microscope and completed the decompression centrally and foraminally.  He had severe spinal stenosis at L3-4 and L4-5 with a good bit of  ligamentum flavum contributing.  When we felt that the decompression was  then completed to hockey-stick in the foramina, the wound was irrigated  well with sterile saline, I placed Gelfoam soaked in thrombin over the  dura and a 1/4-inch Penrose drain through the right inferior incisional  area, then removed the self-retaining retractors and the wound was  closed under direct visualization of the drain so that this was not  included in the sutures, with #1 Vicryl in the paralumbar muscle and  fascia and deep subcutaneous tissue, 2-0 Vicryl superficial subcutaneous  tissue, staples in the skin.  Betadine and  Adaptic, dry sterile dressing were applied.  He was gently placed on his  back and taken to recovery room in satisfactory condition with no known  complications, there was no  dural tear.  Estimated blood loss was  perhaps 700 mL, no blood replacement.           ______________________________  Marlowe Kays, M.D.     JA/MEDQ  D:  10/07/2008  T:  10/08/2008  Job:  [id number]

## 2010-12-27 NOTE — H&P (Signed)
[name], MERGEN NO.:  [id number]   MEDICAL RECORD NO.:  [id number]          PATIENT TYPE:  INP   LOCATION:                               FACILITY:  [facility]   PHYSICIAN:  Marlowe Kays, M.D.  DATE OF BIRTH:  March 11, 1936   DATE OF ADMISSION:  10/07/2008  DATE OF DISCHARGE:                              HISTORY & PHYSICAL   DATE OF ADMISSION:  [facility] October 07, 2008.   CHIEF COMPLAINT:  Pain in my back, buttock with numbness and tingling in  the left leg.   PRESENT ILLNESS:  This 75 year old white male seen by [place] for continuing  progressive problems concerning pain into his back and left lower  extremity.  He has been putting up with this now for several years.  He  is a very independent individual.  As a matter of fact, he is building  his own house at this time and finds this to be extremely interfering  with his day-to-day activities.  We have tried rest and exercise which  certainly has not helped him overall with his discomfort.  We have done  x-rays which showed a considerable amount of degenerative changes.  An  MRI shows spinal stenosis from L2-S1.  The radiologist saw both canal as  well as lateral recess stenosis and foraminal stenosis at most of the  levels.  Taurus is quite tired of this.  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  After much discussion including the  risks and benefits of surgery between him and Dr. Simonne Come, it was  decided to go ahead with decompressive lumbar laminectomy from L2 to the  sacrum.   PAST MEDICAL HISTORY:  This patient has been under the care of Dr. Phylliss Bob  now for considerable period of time.  He is being treated for  hypertension, coronary artery disease.   He is currently on:  1. Prednisone 10 mg a day.  2. Metformin 500 mg a day.  3. Amlodipine 10 mg a day.  4. Skelaxin 800 mg a day.  5. He also  takes temazepam 30 mg a day.  6. For pain he uses hydrocodone 7.5/500 daily.   His family physicians are Dr. Joycelyn Rua, who is his primary care  physician, Dr. Cassell Clement is his cardiologist and Dr. Chase Picket  is his rheumatologist.   ALLERGIES:  He states no medical allergies.   PAST SURGERIES:  1. Include a detached retina surgery in his left eye in 2001.  2. Carpal tunnel release in 2007.  3. Testicular hydrocele surgery in 1995.   FAMILY HISTORY:  Positive for father dying at 58 years of age of kidney  failure and genitourinary problems.  His mother is 8 and living.  He  has a brother at 29 years of age in relatively good health and three  sons, one 68, 4 and 42.   SOCIAL HISTORY:  The patient is married.  He is a Optician, dispensing, historian,  actor, Chartered loss adjuster and Nutritional therapist.  He has never had intake of tobacco  products and has no alcohol intake.  His wife [name] will be a major  caretaker after surgery.   REVIEW OF SYSTEMS:  CNS:  No seizures or paralysis, numbness, double  vision.  RESPIRATORY:  No productive cough, no hemoptysis or shortness of breath.  CARDIOVASCULAR:  No chest pain, no angina, no orthopnea.  GASTROINTESTINAL:  No nausea, vomiting, diarrhea, bloody stool.  GENITOURINARY:  No discharge, dysuria, hematuria.  MUSCULOSKELETAL:  Primarily in present illness with his back and left  lower extremity.   PHYSICAL EXAMINATION:  Alert, cooperative and friendly 6 feet 3 niches  210 pound white male who is accompanied by his wife [name].  VITAL SIGNS:  Are blood pressure 164/78 seated right arm, respirations  are 12, unlabored, pulse 72 regular.  HEENT:  Normocephalic, PERRLA.  Wears glasses.  Oropharynx is clear.  CHEST:  Clear to auscultation.  No rhonchi or rales.  No wheezes.  HEART:  Regular rate and rhythm.  There is a grade 3/6 holosystolic  murmur heard best at the right sternal border.  ABDOMEN:  Soft, nontender, spleen not felt.  GENITALIA/RECTAL:   Not done, not pertinent to present illness.  EXTREMITIES:  The patient has negative straight raise bilaterally.  Some  neurological deficit with some numbness along the lateral thigh.   ADMITTING DIAGNOSES:  1. Spinal stenosis L2-L3, L3-L4, L4-L5, L5-S1.  2. Hypertension.  3. Coronary artery disease.   PLAN:  The patient will undergo decompressive lumbar laminectomy from L2  to the sacrum.  Today I spent a considerable amount of time with [name]  and his wife, answering all questions concerning the perioperative  course.  I told him to contact us if he has had problems or questions  between now and the date of surgery.  He may need home health after his  surgical procedure, perhaps even short-term rehabilitation depending on  how he does.      Dooley L. Cherlynn June.    ______________________________  Marlowe Kays, M.D.    DLU/MEDQ  D:  10/01/2008  T:  10/01/2008  Job:  [id number]   cc:   Marlowe Kays, M.D.  Fax: [phone]   Joycelyn Rua, M.D.  Fax: [phone]   Areatha Keas, M.D.  Fax: [phone]   Cassell Clement, M.D.  Fax: [phone]

## 2010-12-27 NOTE — H&P (Signed)
[name], SHEERIN                 ACCOUNT NO.:  [id number]   MEDICAL RECORD NO.:  [id number]          PATIENT TYPE:  INP   LOCATION:  3035                         FACILITY:  [facility]   PHYSICIAN:  Madlyn Frankel. Charlann Boxer, M.D.  DATE OF BIRTH:  1935-10-09   DATE OF ADMISSION:  12/05/2007  DATE OF DISCHARGE:                              HISTORY & PHYSICAL   REASON FOR ADMISSION:  Left quad rupture.   CHIEF COMPLAINT:  Left leg and knee pain.   HISTORY OF PRESENT ILLNESS:  Mr. [name] is a 75 year old gentleman who  was on a fishing shack on a stool that collapsed underneath him causing  him to fall, to hyperflex his left knee.  He had immediate pain,  inability to bear weight, had to call emergency services and was brought  to the emergency department where we were called to consult on him as he  has been a previous patient of Dr. Simonne Come.  He does have a  significant history of polymyalgia rheumatica and takes prednisone.  He  is now complaining of his right knee hurting significantly.  At least  not hurting due to the accident, but he does have chronic issues due to  the PMR.   PAST MEDICAL HISTORY:  Significant for:  1. Diabetes.  2. Fibromyalgia.  3. Hypertension.  4. Spinal stenosis.  5. Polymyalgia rheumatica.   PAST SURGICAL HISTORY:  Noncontributory.   SOCIAL HISTORY:  Nonsmoker.   FAMILY HISTORY:  Noncontributory.   DRUG ALLERGIES:  SULFA DRUGS.   MEDICATIONS:  Amlodipine, Cymbalta, metformin, prednisone, Skelaxin, and  temazepam.  Please verify the doses as  previous with the patient.   HISTORY OF PRESENT ILLNESS:  See HPI.   PHYSICAL EXAMINATION:  VITAL SIGNS:  Pulse 67, respirations 20, blood  pressure 171/74, and temperature 99.4.  GENERAL:  Awake, alert, and oriented, well developed and well nourished,  in mild distress.  NECK:  Supple.  No carotid bruits.  HEENT:  Normocephalic.  CHEST:  Lungs are clear to auscultation bilaterally.  BREASTS:  Deferred.  HEART:   Regular rate and rhythm. S1 and S2 distinct.  ABDOMEN:  Soft, nontender, and nondistended.  Bowel sounds present.  GENITOURINARY:  Deferred.  PELVIS:  Stable.  EXTREMITIES:  Left lower extremity, unable to do a straight leg raise.  Palpable mass over superior to his left patella with a knotty  appearance.  SKIN:  Cellulitis.  VASCULAR:  Vascularly intact.  NEUROLOGIC:  Intact distal sensibilities.   LABORATORY DATA:  Labs pending.  EKG and chest x-ray pending as well.   IMPRESSION:  Left quadriceps tendon rupture.   PLAN:  Plan of action to OR for open reduction repair of left quad  tendon.  Risks and complications were discussed with the patient.  Questions were encouraged, answer reviewed.     ______________________________  [name] [address], [place]      Madlyn Frankel. Charlann Boxer, M.D.  Electronically Signed    BLM/MEDQ  D:  12/05/2007  T:  12/06/2007  Job:  [id number]

## 2010-12-30 NOTE — H&P (Signed)
NAME:  [name], [name]                           ACCOUNT NO.:  [id number]   MEDICAL RECORD NO.:  [id number]                   PATIENT TYPE:  AMB   LOCATION:                                       FACILITY:  [facility]   PHYSICIAN:  Colleen Can. Deborah Chalk, M.D.            DATE OF BIRTH:  Aug 11, 1936   DATE OF ADMISSION:  12/23/2003  DATE OF DISCHARGE:                                HISTORY & PHYSICAL   CHIEF COMPLAINT:  Abnormal stress Cardiolite study.   HISTORY OF PRESENT ILLNESS:  [name] is a 75 year old white male who has  had a history of known bradycardia with frequent PVCs.  He presented for his  office visit toward the latter part of April and at that time reported that  he had had some atypical chest pain.  He was subsequently referred for a  stress Cardiolite study, which was performed on December 11, 2003.  With that,  he was exercising with standard Bruce protocol.  He walked for a total of 12  minutes without chest pain.  His EKG showed nonspecific ST-T wave changes at  rest.  There were no new changes with exercise to indicate ischemia.  He did  have occasional PVCs during and after exercise.  Ejection fraction was 40%.  There was no ischemia or infarction noted.  There was mild global  hypokinesis which was increased since the previous study of January of 2003.  There was a question of subtle inferior wall hypokinesis.  In light of these  findings, he is now referred for elective diagnostic cardiac  catheterization.   PAST MEDICAL HISTORY:  1. Longstanding  hypertension.  2. Degenerative disk disease.  3. Previous claw toe surgery.  4. History of detached retinal surgery.  5. Significant situation stress.  6. History of sciatica.  7. History of previous hydrocele of the right testicle.   ALLERGIES:  SULFA.   INTOLERANCE:  1. MAXZIDE.  2. DIOVAN.   FAMILY HISTORY:  Positive for coronary disease with his father.  He does  have a brother who has a heart murmur.   SOCIAL  HISTORY:  He is married.  However, works as a Data processing manager.  There is no alcohol or tobacco use.   REVIEW OF SYSTEMS:  He has been under a significant amount of distress  involving legal distress.  He has had no recent fever or flu.  He has had  some atypical chest pain.  No shortness of breath.  He has had longstanding  hypertension and known PVCs.  Otherwise the review of systems is as noted  above and otherwise unremarkable.   PHYSICAL EXAMINATION:  WEIGHT:  211 pounds.  VITAL SIGNS:  Blood pressure 140/70 sitting and 150/70 standing, heart rate  52 with occasional ectopic.  HEENT:  Unremarkable.  NECK:  There are no carotid bruits.  LUNGS:  Basically clear.  HEART:  Regular rhythm.  ABDOMEN:  Soft.  Positive bowel sounds.  Nontender.  EXTREMITIES:  Without edema.  NEUROLOGIC:  Intact.   PERTINENT LABORATORIES:  Pending.   OVERALL IMPRESSION:  1. Abnormal stress Cardiolite study.  2. Longstanding hypertension.  3. Significant situational stress.  4. Atypical chest pain.   PLAN:  Will proceed on with diagnostic cardiac catheterization.  The  procedure was reviewed in full detail with both he and his wife and they are  willing to proceed on Wednesday, Dec 23, 2003.      Sharlee Blew, N.P.                     Colleen Can. Deborah Chalk, M.D.    LC/MEDQ  D:  12/16/2003  T:  12/16/2003  Job:  [id number]   cc:   Cassell Clement, M.D.  [address]  [place]  [place] [address]  Fax: [phone]

## 2010-12-30 NOTE — Op Note (Signed)
[facility]  Patient:    [name], [name] Visit Number: [id number] MRN: [id number]          Service Type: END Location: ENDO Attending Physician:  Deneen Harts Dictated by:   Maretta Bees. Vonita Moss, M.D. Proc. Date: 09/11/01 Admit Date:  09/05/2001 Discharge Date: 09/05/2001   CC:         Kristian Covey, M.D.  Griffith Citron, M.D.   Operative Report  PREOPERATIVE DIAGNOSES:  Right hydrocele and small right spermatocele.  POSTOPERATIVE DIAGNOSES:  Right hydrocele and small right spermatocele.  PROCEDURE:  Right hydrocelectomy and spermatocelectomy.  SURGEON:  Maretta Bees. Vonita Moss, M.D.  ANESTHESIA:  General.  INDICATIONS FOR PROCEDURE:  This 75 year old white male has had an enlarging right scrotal mass that causing some discomfort and an ultrasound had been done at [facility] that showed a right hydrocele and a small right spermatocele. The patient was informed about the problem and instructed that it could be repaired surgically and he requested that be done.  DESCRIPTION OF PROCEDURE:  The patient was brought to the operating room, placed in supine position, external genitalia were prepped and draped in the usual fashion. A right transverse scrotal incision was made and the hydrocele sac entered and drained of clear yellow typical fluid. The testicle was then delivered in the operative field and excess hydrocele sac was excised and the edges fulgurated and later the edges of hydrocele were sutured back behind the testicle with running 3-0 chromic catgut to prevent recurrence. Also a small right spermatocele was unroofed and coagulated and sutured over. At this point, there was excellent hemostasis. The testicles were replaced in the scrotum and the scrotal incision closed in two layers of running 3-0 chromic catgut. The wound was dressed with Neosporin dry sterile gauze and was taken to the recovery room in good condition having tolerated the  procedure well with essentially no blood loss. Dictated by:   Maretta Bees. Vonita Moss, M.D. Attending Physician:  Deneen Harts DD:  09/11/01 TD:  09/12/01 Job: [id number]

## 2010-12-30 NOTE — Op Note (Signed)
NAMEKASSIDY, [name]                 ACCOUNT NO.:  [id number]   MEDICAL RECORD NO.:  [id number]          PATIENT TYPE:  AMB   LOCATION:  [facility]                         FACILITY:  [facility]   PHYSICIAN:  Marlowe Kays, M.D.  DATE OF BIRTH:  [birth year]   DATE OF PROCEDURE:  09/24/2006  DATE OF DISCHARGE:                               OPERATIVE REPORT   PREOPERATIVE DIAGNOSIS:  Right carpal tunnel syndrome.   POSTOP DIAGNOSIS:  Right carpal tunnel syndrome.   OPERATION:  Decompression median nerve right wrist and hand.   SURGEON:  Marlowe Kays, M.D.   ASSISTANT:  Nurse.   ANESTHESIA:  IV regional.   INDICATIONS FOR PROCEDURE:  Signs and symptoms of carpal tunnel syndrome  with nerve conduction studies confirming this.   PROCEDURE:  Satisfactory IV regional anesthesia, DuraPrep from mid  forearm to fingertips, draped in a sterile field.  I marked out a curved  incision along the base of thenar eminence crossing obliquely over the  flexor crease at the wrist and the distal forearm.  He had no palmaris  longus tendon.  The median nerve was identified.  There was compression  proximal to the wrist.  The major compression was in the carpal canal;  and I released the skin, subcutaneous tissue, and a very thick fascia  into the distal palm.  Potential bleeders were coagulated with bipolar  cautery.   At the conclusion of the case the wound was irrigated with sterile  saline; and the skin and subcutaneous tissue only closed with  interrupted 4-0 nylon mattress sutures.  Betadine, Adaptic, dry sterile  dressings, and volar plaster splint were applied.  He tolerated the  procedure well; and at the time of this dictation he was on his way to  the recovery room in satisfactory condition with no known complications.           ______________________________  Marlowe Kays, M.D.     JA/MEDQ  D:  09/24/2006  T:  09/24/2006  Job:  [id number]

## 2010-12-30 NOTE — Discharge Summary (Signed)
[name], MATUSZAK                 ACCOUNT NO.:  [id number]   MEDICAL RECORD NO.:  [id number]          PATIENT TYPE:  INP   LOCATION:  1535                         FACILITY:  [facility]   PHYSICIAN:  Marlowe Kays, M.D.  DATE OF BIRTH:  Mar 08, 1936   DATE OF ADMISSION:  10/07/2008  DATE OF DISCHARGE:  10/12/2008                               DISCHARGE SUMMARY   ADMISSION DIAGNOSES:  1. Spinal stenosis L2-L3, L3-L4, L4-L5, L5-S1.  2. Hypertension.  3. Coronary artery disease.   DISCHARGE DIAGNOSES:  1. spinal stenosis L2-L3, L3-L4, L4-L5, L5-S1.  2. Hypertension.  3. Coronary artery disease.   OPERATION:  On October 07, 2008, the patient underwent decompressive  lumbar laminectomy from L2-S1.  Dr. Darrelyn Hillock assisted.   BRIEF HISTORY:  This 75 year old male seen by [place] for continuing problems  concerning his back.  He also has radiation of pain into the left lower  extremity.  This had been going on for several years and now interfering  with his day-to-day activities.  Conservative care including rest and  exercise have helped him only with minimal relief.  X-rays showed  degenerative changes of the lumbar spine and MRI showed spinal stenosis  from L2-S1.  After much discussion including the risks and benefits of  surgery, it was decided to go ahead with the above procedure.   COURSE IN THE HOSPITAL:  The patient tolerated the surgical procedure  quite well and had a mild temperature postoperatively.  The drain was  removed on the second postoperative day after being advanced on the  first postoperative day.  He had a moderate amount of drainage which is  expected.  He eventually was weaned to p.o. analgesics for his  discomfort and this helped him.  He was a bit reticent to participate in  physical therapy early on, but eventually he did go along with ADLs,  ambulation and back precautions.  Dr. Simonne Come saw the patient on the  day of discharge.  He was discharged home on Robaxin  and Tylox.  Return  to see [place] 2 weeks after date of surgery.  Most all the drainage was very  little drainage at the time of discharge.  He had again markedly  improved his lower extremities and had a considerable amount of decrease  in his leg pain.   LABORATORY DATA:  Laboratory values in the hospital hematologically  showed his preoperative CBC with hemoglobin 15.3, hematocrit 45.0.  Final hemoglobin was 11.1, hematocrit 32.9.  Urinalysis negative for  urinary tract infection.   DIAGNOSTICS:  No chest x-ray is seen on this chart.   CONDITION ON DISCHARGE:  Improved, stable.   PLAN:  The patient is discharged to his home.  Return to the office 2  weeks after date of surgery.  Use dry dressing as needed for the  surgical wound.  He is encouraged to call us should he have any problems  or questions.   DISCHARGE MEDICATIONS:  I will have him continue with his home  medication:  1. __________ 10 mg 1 daily.  2. Metformin 500 mg 1 daily.  3. Prednisone 5 mg 2 tablets daily.  4. Skelaxin 800 mg 1 tablet 4 times a day p.r.n.  5. Temazepam 30 mg at bedtime.  6. Hydrocodone p.r.n. pain.  7. Aspirin 81 mg daily.  8. Azithromycin 250 mg, finish out the prescription.  9. Cosamin DS 2 daily.  10.__________ daily.  11.Vitamin D3 daily.  12.Super-8 Complex daily.  13.Centrum Silver daily.  14.Garlic daily.  15.Omega 3/Omega fish oil daily.  16.ICaps which are eye vitamins daily.  17.Robaxin is given for muscle spasms.  18.Tylox for discomfort.   DISCHARGE INSTRUCTIONS:  Continue with incentive spirometer at home.  Should he have a marked increase in his temperature above 101, he should  call us and we will deal with it appropriately.  All questions  encouraged and answered at discharge.      Dooley L. Cherlynn June.    ______________________________  Marlowe Kays, M.D.    DLU/MEDQ  D:  11/11/2008  T:  11/11/2008  Job:  [id number]   cc:   Marlowe Kays, M.D.  Fax:  [phone]   Joycelyn Rua, M.D.  Fax: [phone]   Areatha Keas, M.D.  Fax: [phone]   Cassell Clement, M.D.  Fax: [phone]

## 2010-12-30 NOTE — Discharge Summary (Signed)
[name], [name]                 ACCOUNT NO.:  [id number]   MEDICAL RECORD NO.:  [id number]          PATIENT TYPE:  INP   LOCATION:  5020                         FACILITY:  [facility]   PHYSICIAN:  Madlyn Frankel. Charlann Boxer, M.D.  DATE OF BIRTH:  [birth year]   DATE OF ADMISSION:  12/05/2007  DATE OF DISCHARGE:  12/10/2007                               DISCHARGE SUMMARY   ADMITTING DIAGNOSES:  1. Left quadriceps rupture.  2. Diabetes.  3. Fibromyalgia.  4. Hypertension.  5. Spinal stenosis.  6. Polymyalgia rheumatica.   DISCHARGE DIAGNOSES:  1. Status post repair of left quadriceps rupture.  2. Diabetes.  3. Fibromyalgia.  4. Hypertension.  5. Spinal stenosis.  6. Polymyalgia rheumatica.   HISTORY OF PRESENT ILLNESS:  [name] is a 75 year old gentleman who  was on a stool that collapsed underneath him, hyperflexing his left  knee.  He had an immediate pain, inability to bear weight, had to call  the emergency department, so he was brought to the emergency department  for a consultation.  He has been a previous patient of Dr. Simonne Come.  We were called to see him as we were on-call for [facility].  When seen  in the ER, he had a significant amount of pain, unable to do a straight  leg raise, and had a palpable mass superior to his left patella.  It was  knotty in appearance.  He was admitted for definitive repair of his quad  rupture by Dr. Charlann Boxer.   CONSULTATIONS:  None.   PROCEDURE:  Open quad repair of the left quad tendon by surgeon, Dr.  Durene Romans.   LABORATORY DATA:  On December 05, 2007, his white blood cell count was  13.8, hemoglobin 15.4, hematocrit 44.7, and platelets were 253.  Prior  to discharge, he was unstable with no significant trending and his  hematocrit was 41.9.  His white blood cell was 14.8 and all others are  normal.  White cell differential on admission, his neutrophils were up  at 83 and lymphs at 9.  Coagulation all within normal limits.  Routine  chemistry  on admission:  Sodium 141, potassium 3.8, glucose 139, and  creatinine 0.76.  On discharge, sodium 136, potassium 3.9, glucose 174,  and creatinine 0.92.  Kidney function:  All GFRs are greater than 60 and  his calcium was 8.6 at discharge.  His GI workup was all normal with  total protein of 6.5 and albumin of 4.1.  His UA was negative.   EKG shows sinus bradycardia.   RADIOLOGY:  Chest, one-view, showed no active cardiopulmonary disease.  Left knee x-ray showed no acute fracture, prepatellar anterior knee soft  tissue swelling.   HOSPITAL COURSE:  The patient admitted through emergency department to  our orthopedic service for quad tendon repair.  He stayed overnight on  the 24th, and seen on the 25th for surgery.  He tolerated procedure  well.  He was seen on the next day and had a little bit of an elevated  temperature of 100.1 with white blood cell of 14.8.  Encouraged  incentive spirometer and Tylenol for fever.  He made a progress of his  knee in a straight knee immobilizer.  His wound remained free of any  significant serosanguineous ooze.  Dressing was changed on a daily  basis.  He remained neurovascularly intact in his left lower extremity  throughout.  We changed his Skelaxin over to Robaxin, Hep locked his IV,  with plans to discharge due to his ability to walk 200 feet.  Seen on  the 28th, was doing well.  No events, afebrile.  Left knee was clean,  dry, and intact.  He was ready for discharge to home with all  medications prescribed on chart.   DISCHARGE DISPOSITION:  Discharged home in stable and improved  condition.  Weightbearing as tolerated with knee in full extension and  with knee immobilizer.   DISCHARGE DIET:  Regular.   DISCHARGE WOUND CARE:  Keep dry.   DISCHARGE MEDICATIONS:  1. Oxycodone 5 mg 1-3 p.o. q.3-4 p.r.n. pain.  2. Robaxin 500 mg p.o. q.6-8 h. p.r.n. muscle spasm pain.  3. Metformin 500 mg p.o. daily.  4. Amlodipine 10 mg p.o. daily.  5.  Cymbalta 30 mg p.o. daily.  6. Prednisone 5 mg 3 p.o. daily.  7. Temazepam 30 mg p.o. at bedtime.   DISCHARGE FOLLOWUP:  Follow up with Dr. Charlann Boxer, phone number [phone], in  2 weeks.     ______________________________  [name] [address], [place]      Madlyn Frankel. Charlann Boxer, M.D.  Electronically Signed    BLM/MEDQ  D:  12/27/2007  T:  12/28/2007  Job:  [id number]

## 2010-12-30 NOTE — Cardiovascular Report (Signed)
NAME:  [name], [name]                           ACCOUNT NO.:  [id number]   MEDICAL RECORD NO.:  [id number]                   PATIENT TYPE:  OIB   LOCATION:  6598                                 FACILITY:  [facility]   PHYSICIAN:  Colleen Can. Deborah Chalk, M.D.            DATE OF BIRTH:  03-12-1936   DATE OF PROCEDURE:  12/23/2003  DATE OF DISCHARGE:  12/23/2003                              CARDIAC CATHETERIZATION   HISTORY:  [name] presents for cardiac catheterization with an equivocal  stress Cardiolite study with questionable inferior hypokinesia as well as  atypical chest pain.  He is referred now for cardiac catheterization.   PROCEDURE:  Left heart catheterization with selective coronary angiography  and left ventricular angiography.   TYPE AND SITE OF ENTRY:  Percutaneous right femoral artery.   CATHETERS:  1. 4 French Judkins right and left coronary catheters.  2. 4 French pigtail ventricular ventriculographic catheter.   CONTRAST MATERIAL:  Omnipaque.   MEDICATIONS GIVEN PRIOR TO PROCEDURE:  Valium 10 mg p.o.   MEDICATIONS GIVEN DURING THE PROCEDURE:  Versed 2 mg IV.   COMMENTS:  Patient tolerated the procedure well.   HEMODYNAMIC DATA:  1. The aortic pressure was 147/67.  2. LV was 147/8-12.  3. There is no aortic valve gradient noted on pullback.   ANGIOGRAPHIC DATA:  1. Left main coronary artery is normal.  2. Left anterior descending was a moderate sized vessel.  It tapers and is     only approximately 1.5 mm as it passes its distal part and extends to the     apex.  There is no significant obstructive disease at this point with     mild irregularities.  There is a large second diagonal vessel with 30-40%     narrowing noted.  3. Left circumflex.  The left circumflex is a large dominant left     circumflex.  It has minor irregularities but no significant obstructive     disease.  4. Right coronary artery.  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     It is  normal.  5. Left ventricular angiogram was performed in the RAO position.  Overall     cardiac size is normal.  There is some very minimal distal inferior     hypokinesia that may be arrhythmia aggravated.  There is apical     contraction and anterior contraction and certainly inferior basal and     midportion contraction.  This is a somewhat subtle finding.  The global     ejection fraction would be estimated to be in the 50% range but is     affected by ventricular ectopy.  There is no mitral regurgitation noted.   OVERALL IMPRESSION:  1. Minimal coronary atherosclerosis with mild atherosclerosis in the left     anterior descending coronary but otherwise normal coronary arteries.  2. Mild left ventricular dysfunction with very minimal distal  inferior     hypokinesia.   DISCUSSION:  It is felt that the etiology of [name] problems are not  coronary in nature.                                               Colleen Can. Deborah Chalk, M.D.    SNT/MEDQ  D:  12/23/2003  T:  12/23/2003  Job:  [id number]   cc:   Cassell Clement, M.D.  [address]  [place]  [place] [address]  Fax: [phone]

## 2011-01-04 ENCOUNTER — Encounter (HOSPITAL_COMMUNITY): Payer: Medicare Other

## 2011-01-04 ENCOUNTER — Other Ambulatory Visit: Payer: Self-pay | Admitting: Orthopedic Surgery

## 2011-01-04 ENCOUNTER — Other Ambulatory Visit (HOSPITAL_COMMUNITY): Payer: Self-pay | Admitting: Orthopedic Surgery

## 2011-01-04 ENCOUNTER — Ambulatory Visit (HOSPITAL_COMMUNITY)
Admission: RE | Admit: 2011-01-04 | Discharge: 2011-01-04 | Disposition: A | Discharge status: Home / Self Care | Payer: Medicare Other | Source: Ambulatory Visit | Attending: Orthopedic Surgery | Admitting: Orthopedic Surgery

## 2011-01-04 DIAGNOSIS — Z01818 Encounter for other preprocedural examination: Secondary | ICD-10-CM | POA: Insufficient documentation

## 2011-01-04 DIAGNOSIS — I10 Essential (primary) hypertension: Secondary | ICD-10-CM | POA: Insufficient documentation

## 2011-01-04 DIAGNOSIS — Z01812 Encounter for preprocedural laboratory examination: Secondary | ICD-10-CM | POA: Insufficient documentation

## 2011-01-04 LAB — BASIC METABOLIC PANEL
BUN: 18 mg/dL (ref 6–23)
Calcium: 9.1 mg/dL (ref 8.4–10.5)
Chloride: 104 mEq/L (ref 96–112)
Creatinine, Ser: 0.76 mg/dL (ref 0.4–1.5)
GFR calc Af Amer: >60 mL/min (ref >60)
GFR calc non Af Amer: >60 mL/min (ref >60)

## 2011-01-04 LAB — SURGICAL PCR SCREEN
MRSA, PCR: NEGATIVE
Staphylococcus aureus: NEGATIVE

## 2011-01-04 LAB — CBC
MCH: 28.9 pg (ref 26.0–34.0)
MCHC: 32.5 g/dL (ref 30.0–36.0)
Platelets: 219 10*3/uL (ref 150–400)
RBC: 4.67 MIL/uL (ref 4.22–5.81)
RDW: 12.7 % (ref 11.5–15.5)

## 2011-01-05 ENCOUNTER — Other Ambulatory Visit: Payer: Self-pay | Admitting: *Deleted

## 2011-01-05 DIAGNOSIS — E119 Type 2 diabetes mellitus without complications: Secondary | ICD-10-CM

## 2011-01-05 DIAGNOSIS — E7800 Pure hypercholesterolemia, unspecified: Secondary | ICD-10-CM

## 2011-01-06 ENCOUNTER — Other Ambulatory Visit (INDEPENDENT_AMBULATORY_CARE_PROVIDER_SITE_OTHER): Payer: Medicare Other | Admitting: *Deleted

## 2011-01-06 DIAGNOSIS — E119 Type 2 diabetes mellitus without complications: Secondary | ICD-10-CM

## 2011-01-06 DIAGNOSIS — E7800 Pure hypercholesterolemia, unspecified: Secondary | ICD-10-CM

## 2011-01-06 LAB — HEPATIC FUNCTION PANEL
AST: 32 U/L (ref 0–37)
Albumin: 3.7 g/dL (ref 3.5–5.2)
Alkaline Phosphatase: 53 U/L (ref 39–117)
Bilirubin, Direct: 0.1 mg/dL (ref 0.0–0.3)
Total Protein: 6 g/dL (ref 6.0–8.3)

## 2011-01-06 LAB — BASIC METABOLIC PANEL
BUN: 18 mg/dL (ref 6–23)
Calcium: 8.9 mg/dL (ref 8.4–10.5)
Creatinine, Ser: 0.8 mg/dL (ref 0.4–1.5)
GFR: 103.78 mL/min (ref >60.00)

## 2011-01-06 LAB — LIPID PANEL
Cholesterol: 139 mg/dL (ref 0–200)
Triglycerides: 170 mg/dL — ABNORMAL HIGH (ref 0.0–149.0)

## 2011-01-10 ENCOUNTER — Ambulatory Visit (INDEPENDENT_AMBULATORY_CARE_PROVIDER_SITE_OTHER): Payer: Medicare Other | Admitting: Cardiology

## 2011-01-10 ENCOUNTER — Telehealth: Payer: Self-pay | Admitting: Cardiology

## 2011-01-10 ENCOUNTER — Encounter: Payer: Self-pay | Admitting: Cardiology

## 2011-01-10 DIAGNOSIS — E785 Hyperlipidemia, unspecified: Secondary | ICD-10-CM

## 2011-01-10 DIAGNOSIS — I119 Hypertensive heart disease without heart failure: Secondary | ICD-10-CM

## 2011-01-10 DIAGNOSIS — IMO0001 Reserved for inherently not codable concepts without codable children: Secondary | ICD-10-CM | POA: Insufficient documentation

## 2011-01-10 DIAGNOSIS — M353 Polymyalgia rheumatica: Secondary | ICD-10-CM | POA: Insufficient documentation

## 2011-01-10 DIAGNOSIS — E119 Type 2 diabetes mellitus without complications: Secondary | ICD-10-CM

## 2011-01-10 NOTE — Telephone Encounter (Signed)
Fax: [phone] would like the dictation from todays OV as soon as possible. Patient is scheduled for a surgery tomorrow 1:10 at [facility].

## 2011-01-10 NOTE — Assessment & Plan Note (Signed)
Patient has a long history of essential hypertension.  He has also been on prednisone for his polymyalgia rheumatica and this has caused problems with his diabetes and his blood pressure.  He is not having any chest pain or shortness of breath he has been limited in his exercise because of a chronic right ankle injury and is scheduled for surgery tomorrow at [facility] hospital.

## 2011-01-10 NOTE — Assessment & Plan Note (Addendum)
The patient is trying to watch his diet in regard to his diabetes and to his cholesterol.  He has been able to lose some weight which has helped bring his triglycerides down although they were still above the upper limit.The patient is not on statin therapy because of previous intolerance.

## 2011-01-10 NOTE — Progress Notes (Signed)
[name] Date of Birth:  Dec 31, 1935 [facility] / [facility] [address]   [address] [place], [place]  [address] [phone]           Fax   [phone]  History of Present Illness: This pleasant 75 year old gentleman is seen for a scheduled 4 month followup office visit.  He has a past history of diabetes essential hypertension and frequent PVCs.  He has also had a history of polymyalgia rheumatica.  He does not have significant coronary disease.  He had an equivocal Cardiolite nuclear stress test in 2005 but subsequent cardiac catheterization showed only minimal coronary atherosclerosis and no obstructive lesions.  The patient had an echocardiogram in December 2008 which showed mild aortic insufficiency and mild mitral regurgitation and mild tricuspid regurgitation with normal pulmonary artery pressure.  He had impaired relaxation and he had normal left ventricular systolic function.  Recently the patient has been less physically active because of ongoing musculoskeletal problems.  He has a chronic right ankle injury which is scheduled to be repaired tomorrow at [facility] hospital by Dr. Simonne Come  Current Outpatient Prescriptions  Medication Sig Dispense Refill  . amLODipine (NORVASC) 10 MG tablet Take 1 tablet by mouth Daily.      Marland Kitchen aspirin 81 MG tablet Take 81 mg by mouth daily.        . Cholecalciferol (VITAMIN D) 2000 UNITS tablet Take 2,000 Units by mouth daily.        . fentaNYL (DURAGESIC - DOSED MCG/HR) 25 MCG/HR 1 patch as directed.      Marland Kitchen HYDROcodone-acetaminophen (LORTAB) 10-500 MG per tablet Take 1 tablet by mouth Ad lib.      . Lancets (ONETOUCH ULTRASOFT) lancets TEST BLOOD SUGAR AS DIRECTED  100 each  11  . leflunomide (ARAVA) 20 MG tablet Take 1 tablet by mouth Daily.      Marland Kitchen lisinopril (PRINIVIL,ZESTRIL) 10 MG tablet Take 1 tablet by mouth Daily.      . metFORMIN (GLUCOPHAGE) 500 MG tablet Take 1 tablet by mouth Twice daily.      Marland Kitchen NASONEX 50 MCG/ACT  nasal spray 1 spray Daily.      . ONE TOUCH ULTRA TEST test strip TEST BLOOD SUGAR AS DIRECTED  100 each  11  . predniSONE (DELTASONE) 5 MG tablet Take 5 mg by mouth daily.        Marland Kitchen TEMAZEPAM PO Take 30 mg by mouth at bedtime.          Allergies  Allergen Reactions  . Sulfa Drugs Cross Reactors     Patient Active Problem List  Diagnoses  . Polymyalgia rheumatica  . Benign hypertensive heart disease without heart failure  . Dyslipidemia  . Diabetes mellitus    History  Smoking status  . Never Smoker   Smokeless tobacco  . Never Used    History  Alcohol Use No    History reviewed. No pertinent family history.  Review of Systems: Constitutional: no fever chills diaphoresis or fatigue or change in weight.  Head and neck: no hearing loss, no epistaxis, no photophobia or visual disturbance. Respiratory: No cough, shortness of breath or wheezing. Cardiovascular: No chest pain peripheral edema, palpitations. Gastrointestinal: No abdominal distention, no abdominal pain, no change in bowel habits hematochezia or melena. Genitourinary: No dysuria, no frequency, no urgency, no nocturia. Musculoskeletal:No arthralgias, no back pain, no gait disturbance or myalgias. Neurological: No dizziness, no headaches, no numbness, no seizures, no syncope, no weakness, no tremors. Hematologic:  No lymphadenopathy, no easy bruising. Psychiatric: No confusion, no hallucinations, no sleep disturbance.    Physical Exam: Filed Vitals:   01/10/11 1602  BP: 130/60  Pulse: 57  The general appearance feels a well-developed well-nourished gentleman in no distress.Pupils equal and reactive.   Extraocular Movements are full.  There is no scleral icterus.  The mouth and pharynx are normal.  The neck is supple.  The carotids reveal no bruits.  The jugular venous pressure is normal.  The thyroid is not enlarged.  There is no lymphadenopathy.The chest is clear to percussion and auscultation. There are no  rales or rhonchi. Expansion of the chest is symmetrical.  Heart reveals a grade 2/6 systolic ejection murmur.The abdomen is soft and nontender. Bowel sounds are normal. The liver and spleen are not enlarged. There Are no abdominal masses. There are no bruits.  Normal extremity with moderate chronic edema of the right ankle.  Pedal pulses are present.  No phlebitis.The skin is warm and dry.  There is no rash.Strength is normal and symmetrical in all extremities.  There is no lateralizing weakness.  There are no sensory deficits. EKG today shows sinus bradycardia with premature atrial beats and nonspecific T-wave abnormalities.  Assessment / Plan: The patient is a satisfactory risk for orthopedic surgery tomorrow.  He is to continue same medication and be rechecked in 4 months for office visit and get fasting blood work ahead of time

## 2011-01-10 NOTE — Assessment & Plan Note (Signed)
The patient checks his blood sugars twice a day.  His drug store was asking him to provide readings for 3 times a day but since his readings have been relatively stable there is no indication to test at 3 times a day.  He is not having any hypoglycemic episodes.

## 2011-01-11 ENCOUNTER — Ambulatory Visit (HOSPITAL_COMMUNITY): Payer: Medicare Other

## 2011-01-11 ENCOUNTER — Other Ambulatory Visit: Payer: Self-pay | Admitting: Orthopedic Surgery

## 2011-01-11 ENCOUNTER — Inpatient Hospital Stay (HOSPITAL_COMMUNITY)
Admission: RE | Admit: 2011-01-11 | Discharge: 2011-01-15 | DRG: 494 | Disposition: A | Discharge status: Home Health | Payer: Medicare Other | Source: Ambulatory Visit | Attending: Orthopedic Surgery | Admitting: Orthopedic Surgery

## 2011-01-11 DIAGNOSIS — I498 Other specified cardiac arrhythmias: Secondary | ICD-10-CM | POA: Diagnosis present

## 2011-01-11 DIAGNOSIS — E119 Type 2 diabetes mellitus without complications: Secondary | ICD-10-CM | POA: Diagnosis present

## 2011-01-11 DIAGNOSIS — M353 Polymyalgia rheumatica: Secondary | ICD-10-CM | POA: Diagnosis present

## 2011-01-11 DIAGNOSIS — M069 Rheumatoid arthritis, unspecified: Secondary | ICD-10-CM | POA: Diagnosis present

## 2011-01-11 DIAGNOSIS — M25519 Pain in unspecified shoulder: Secondary | ICD-10-CM | POA: Diagnosis not present

## 2011-01-11 DIAGNOSIS — I10 Essential (primary) hypertension: Secondary | ICD-10-CM | POA: Diagnosis present

## 2011-01-11 DIAGNOSIS — Z01812 Encounter for preprocedural laboratory examination: Secondary | ICD-10-CM

## 2011-01-11 DIAGNOSIS — R0789 Other chest pain: Secondary | ICD-10-CM | POA: Diagnosis not present

## 2011-01-11 DIAGNOSIS — M19079 Primary osteoarthritis, unspecified ankle and foot: Principal | ICD-10-CM | POA: Diagnosis present

## 2011-01-11 HISTORY — PX: ANKLE ARTHRODESIS: SUR49

## 2011-01-11 LAB — GLUCOSE, CAPILLARY: Glucose-Capillary: 130 mg/dL — ABNORMAL HIGH (ref 70–99)

## 2011-01-11 NOTE — Telephone Encounter (Signed)
Office note from 5/29 faxed to [phone] @ 1805 01/10/11

## 2011-01-12 LAB — GLUCOSE, CAPILLARY
Glucose-Capillary: 122 mg/dL — ABNORMAL HIGH (ref 70–99)
Glucose-Capillary: 124 mg/dL — ABNORMAL HIGH (ref 70–99)

## 2011-01-13 LAB — GLUCOSE, CAPILLARY: Glucose-Capillary: 128 mg/dL — ABNORMAL HIGH (ref 70–99)

## 2011-01-14 ENCOUNTER — Ambulatory Visit (HOSPITAL_COMMUNITY): Payer: Medicare Other

## 2011-01-14 LAB — BASIC METABOLIC PANEL
BUN: 11 mg/dL (ref 6–23)
CO2: 29 mEq/L (ref 19–32)
Chloride: 101 mEq/L (ref 96–112)
Creatinine, Ser: 0.64 mg/dL (ref 0.4–1.5)
Glucose, Bld: 110 mg/dL — ABNORMAL HIGH (ref 70–99)

## 2011-01-14 LAB — CARDIAC PANEL(CRET KIN+CKTOT+MB+TROPI)
CK, MB: 2.8 ng/mL (ref 0.3–4.0)
Relative Index: INVALID (ref 0.0–2.5)
Troponin I: <0.3 ng/mL (ref <0.30)
Troponin I: <0.3 ng/mL (ref <0.30)

## 2011-01-14 LAB — GLUCOSE, CAPILLARY: Glucose-Capillary: 102 mg/dL — ABNORMAL HIGH (ref 70–99)

## 2011-01-14 LAB — CBC
MCH: 29.5 pg (ref 26.0–34.0)
MCHC: 33.4 g/dL (ref 30.0–36.0)
Platelets: 185 10*3/uL (ref 150–400)
RBC: 4.47 MIL/uL (ref 4.22–5.81)

## 2011-01-15 LAB — BASIC METABOLIC PANEL
CO2: 31 mEq/L (ref 19–32)
Calcium: 8.9 mg/dL (ref 8.4–10.5)
Chloride: 105 mEq/L (ref 96–112)
GFR calc Af Amer: >60 mL/min (ref >60)
Sodium: 142 mEq/L (ref 135–145)

## 2011-01-15 LAB — CBC
Hemoglobin: 12.6 g/dL — ABNORMAL LOW (ref 13.0–17.0)
MCH: 29.6 pg (ref 26.0–34.0)
MCV: 87.8 fL (ref 78.0–100.0)
RBC: 4.26 MIL/uL (ref 4.22–5.81)

## 2011-01-15 LAB — GLUCOSE, CAPILLARY: Glucose-Capillary: 120 mg/dL — ABNORMAL HIGH (ref 70–99)

## 2011-01-17 NOTE — Op Note (Signed)
[name], [name]                 ACCOUNT NO.:  [id number]  MEDICAL RECORD NO.:  [id number]           PATIENT TYPE:  O  LOCATION:  1606                         FACILITY:  [facility]  PHYSICIAN:  [name], M.D.  DATE OF BIRTH:  03-03-1936  DATE OF PROCEDURE:  01/11/2011 DATE OF DISCHARGE:                              OPERATIVE REPORT   PREOPERATIVE DIAGNOSIS:  Osteoarthritis, right ankle.  POSTOPERATIVE DIAGNOSIS:  Osteoarthritis, right ankle.  OPERATION:  Right ankle arthrodesis.  SURGEON:  Andrew Kays, MD  ASSISTANT:  [name]. Andrew Primer, PA-C  ANESTHESIA:  General.  JUSTIFICATION FOR PROCEDURE:  He has a long history he says of ankle sprains.  I have followed him for a number of years with progressive deformity and pain in his right ankle.  The talus is in varus and digging up into the superior medial plafond.  Complicating things, he also has rheumatoid arthritis of fairly recent onset.  He has got to the point where pain is just too much of a problem, and consequently he is here for the above-mentioned surgery.  PROCEDURE:  Prophylactic antibiotics, satisfactory general anesthesia, pneumatic tourniquet applied, and his leg was Esmarched out nonsterilely and tourniquet inflated to 325 mmHg.  Right leg was then prepped with DuraPrep from just below the knee to tips of his toes and draped in sterile field.  Time-out performed.  I performed an anterior incision just lateral to the anterior tibial tendon, neurovascular structures were protected and with careful dissection, I went down to the ankle joint which I opened.  There was some fluid present and a good bit of inflammatory type tissue which seemed to be compatible with rheumatoid arthritis.  I sent this off to the lab.  I then exposed the ankle joint and initially made an osteotomy with the tibial portion of the joint with combination of microsaw, osteotome, curette and bur.  I then made leveling cut on the  talus as well.  I burred down the underneath surface of both the medial malleolus and was sized of a talus.  Using C-arm, I made sure that we had removed all posterior tibial bone and that the talus and ankle mortise were parallel to the floor on the AP projection. I then made small incisions __________ flare of both malleoli and placed the guide pin for 6.5 cannulated Synthes screw inserting it into the talus on appropriate position both AP and lateral.  I then packed the ankle joint with a combination of allograft bone which we had saved during exposure and also a tiny allograft bone.  I then used two 60 mm 6.5 cancellus screws, placed in the ankle in the desired position into the talus.  When I have satisfied that the screws were up against the fibula and tibia respectively and the position of talus was satisfactory.  We removed the guide pins.  I then closed the 2 wounds with 3-0 nylon and Marcaine plain.  I then closed the major ankle with interrupted 2-0 Vicryl in the synovium and in the retinaculum overlying the extensor tendons and interrupted 4-0 nylon mattress sutures in skin and subcutaneous  tissue.  Betadine, Adaptic dry sterile dressing were well padded.  Short leg splint casts were applied.  He tolerated the procedure well.  At the time of this dictation was on his way to recovery room in satisfactory condition with no known complications.          ______________________________ [name], M.D.     JA/MEDQ  D:  01/11/2011  T:  01/12/2011  Job:  [id number]  Electronically Signed by [name] M.D. on 01/17/2011 01:25:48 PM

## 2011-02-08 NOTE — H&P (Addendum)
NAMELINC, RENNE NO.:  [id number]  MEDICAL RECORD NO.:  [id number]  LOCATION:  1606                         FACILITY:  [facility]  PHYSICIAN:  Marlowe Kays, M.D.  DATE OF BIRTH:  Sep 07, 1935  DATE OF ADMISSION:  01/11/2011 DATE OF DISCHARGE:  01/15/2011                             HISTORY & PHYSICAL   This history and physical dictation is dictated from information and the patient's hospital chart.  BRIEF HISTORY:  This 75 year old white male, who has been seen by [place] for several orthopedic problems, but this one in particular was due to problems in his right ankle.  He gives a long history of ankle sprains over the years and has had progressive deformity and pain into the right ankle.  He has developed a varus talus and it digs into the superior medial platform.  Also, has rheumatoid arthritis with recent onset diagnosed not too distant past.  He is a very active gentleman.  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 After much discussion to the risks and benefits of surgery, he decided to go ahead with ankle fusion on the right.  PAST MEDICAL HISTORY:  This gentleman has been in relatively good health.  He does have a recent onset or diagnosis of rheumatoid arthritis.  CURRENT MEDICATIONS: 1. Oxymetazoline 10 nasal spray daily. 2. Nasonex 2 sprays nasal daily. 3. Vitamin D3 2000 units 2 tablets daily. 4. Super B complex. 5. Fish oil. 6. Garlic extract. 7. Multivitamins. 8. Cosamin DS. 9. Citrucel. 10.Amlodipine 10 mg 1 tablet daily. 11.Cymbalta 60 mg 1 tablet q.48h. 12.Temazepam 30 mg tab one at bedtime p.r.n. 13.Lisinopril 10 mg daily. 14.Metformin 500 mg 2 tablets daily. 15.Leflunomide 20 mg daily. 16.Prednisone 5 mg daily. 17.Fentanyl patch 25 mcg every 3 days. 18.Hydrocodone for discomfort. 19.81 mg enteric-coated aspirin daily.  REVIEW OF  SYSTEMS:  CNS:  No seizures disorder, paralysis, numbness, double vision. RESPIRATORY:  No productive cough.  No hemoptysis.  No shortness of breath. CARDIOVASCULAR:  No chest pain.  No angina.  No orthopnea. GASTROINTESTINAL:  No nausea, vomiting, melena or bloody stool. GENITOURINARY:  No discharge, dysuria, hematuria. MUSCULOSKELETAL:  Primarily as in present illness.  PHYSICAL EXAMINATION:  GENERAL:  Very cooperative, friendly 75 year old white male.  He walks with a decided limp. HEENT:  Normocephalic.  PERRLA.  EOM intact. VITAL SIGNS:  Please see hospital record. CHEST:  Clear to auscultation.  No rhonchi.  No rales. HEART:  Regular rate and rhythm.  No murmurs are heard. ABDOMEN:  Soft.  Kidney, liver and spleen not felt. GENITALIA:  Rectal not done, not pertinent for present illness. EXTREMITIES:  Right lower extremity seems semi-swollen ankle.  He has relatively good range of range of motion, but weightbearing is painful.  ADMISSION DIAGNOSES: 1. Osteoarthritis of the right ankle. 2. Hypertension. 3. Anxiety.  DISCHARGE DIAGNOSES: 1. Osteoarthritis of the right ankle. 2. Hypertension. 3. Anxiety.  PLAN:  The patient will undergo ankle fusion on the right.  Risks and benefits of surgery have been explained to him.     Helen Winterhalter L. Detrick Dani, P.A.  ______________________________ Marlowe Kays, M.D.    DLU/MEDQ  D:  02/03/2011  T:  02/03/2011  Job:  [id number]  cc:   Marlowe Kays, M.D. Fax: [phone]  Electronically Signed by Marlowe Kays M.D. on 02/08/2011 11:91:47 PM Electronically Signed by Alvera Novel  on 02/10/2011 12:11:23 PM

## 2011-02-08 NOTE — Discharge Summary (Addendum)
[name], [name]                 ACCOUNT NO.:  [id number]  MEDICAL RECORD NO.:  [id number]  LOCATION:  1606                         FACILITY:  [facility]  PHYSICIAN:  Marlowe Kays, M.D.  DATE OF BIRTH:  1936-07-07  DATE OF ADMISSION:  01/11/2011 DATE OF DISCHARGE:  01/15/2011                              DISCHARGE SUMMARY   ADMITTING DIAGNOSES: 1. Osteoarthritis of the right ankle. 2. Hypertension.  DISCHARGE DIAGNOSES: 1. Osteoarthritis of the right ankle. 2. Hypertension.  OPERATION:  On Jan 11, 2011, the patient underwent right ankle arthrodesis.  Jenne Campus assisted.  BRIEF HISTORY:  This 75 year old male had many-year problems with his right ankle.  He has had multiple sprains over the years.  He has had progressive pain and deformity into the right ankle and developed a talus that remains in varus.  This is digging up into the superior medial platform.  This is a very active gentleman.  He is trying to remodelling a home and is having more and more problems getting about. After much discussion including the risks, benefits of surgery, he decided to go ahead with arthrodesis of the ankle.  COURSE IN THE HOSPITAL:  The patient tolerated the surgical procedure quite well.  He, of course, was nonweightbearing on the operative extremity.  Physical Therapy saw the patient, helped him with ambulation.  He did have some mild discomfort in his upper arms bilaterally.  X-ray of the chest was done showing cardiomegaly with increased vascular congestion, small effusions.  This soon resolved and the patient was continued with his inpatient physical therapy.  He did have some agitation, but soon calmed down.  Appropriate labs were performed after this chest pain, CK was 86, CK-MB was 2.8, and a troponins was 0.3.  Electrocardiogram showed bradycardia, no changes from September 17, 2010, EKG.  Again, the chest x-ray showed some mild cardiomegaly.  It was felt by Dr. Lequita Halt and  Dr. Patrica Duel that it is likely musculoskeletal type of situation and they changed his medications from morphine to Dilaudid and he had no more further incidents throughout the hospital.  Once arrangements were made for his discharge, Dr. Charlann Boxer, felt he was ready for home placement and he was discharged to his home.  DISCHARGE MEDICATIONS:  Discharged home on his, 1. Nasal sprays 2. Vitamin D. 3. Super B complex. 4. Fish oil. 5. Garlic extract. 6. Multivitamin. 7. Cosamin DS. 8. Citracal. 9. MSM. 10.Amlodipine. 11.Cymbalta 60 mg daily. 12.Temazepam 30 mg daily. 13.Lisinopril 10 mg daily. 14.Metformin 2 tablets daily. 15.Leflunomide 20 mg daily. 16.Percocet for discomfort.  He can continue with his home diet.  Return to see [place] in the office in 2 weeks after date of surgery.  At the time of discharge, neurovascular is intact to the toes, the postoperative splint was intact.  He was encouraged to elevate the ankle and use ice as necessary.     [name].   ______________________________ Marlowe Kays, M.D.    DLU/MEDQ  D:  02/03/2011  T:  02/04/2011  Job:  [id number]  Electronically Signed by Marlowe Kays M.D. on 02/08/2011 02:21:34 PM Electronically Signed by Alvera Novel  on 02/10/2011 12:11:21  PM

## 2011-02-09 ENCOUNTER — Encounter: Payer: Self-pay | Admitting: Cardiology

## 2011-02-09 ENCOUNTER — Other Ambulatory Visit: Payer: Self-pay | Admitting: *Deleted

## 2011-02-09 DIAGNOSIS — G4700 Insomnia, unspecified: Secondary | ICD-10-CM

## 2011-02-09 MED ORDER — TEMAZEPAM 30 MG PO CAPS: 30.0000 mg | ORAL_CAPSULE | Freq: Every evening | ORAL | Status: DC | PRN

## 2011-02-09 NOTE — Telephone Encounter (Signed)
Refilled meds per fax request.  

## 2011-02-10 ENCOUNTER — Other Ambulatory Visit: Payer: Self-pay | Admitting: *Deleted

## 2011-04-07 ENCOUNTER — Encounter: Payer: Self-pay | Admitting: Cardiology

## 2011-04-13 ENCOUNTER — Other Ambulatory Visit: Payer: Self-pay | Admitting: *Deleted

## 2011-04-13 DIAGNOSIS — G4700 Insomnia, unspecified: Secondary | ICD-10-CM

## 2011-04-13 MED ORDER — TEMAZEPAM 30 MG PO CAPS: 30.0000 mg | ORAL_CAPSULE | Freq: Every evening | ORAL | Status: DC | PRN

## 2011-04-13 NOTE — Telephone Encounter (Signed)
Refilled meds per fax request.  

## 2011-04-18 ENCOUNTER — Other Ambulatory Visit: Payer: Medicare Other | Admitting: *Deleted

## 2011-04-18 ENCOUNTER — Other Ambulatory Visit: Payer: Self-pay | Admitting: Cardiology

## 2011-04-18 DIAGNOSIS — E785 Hyperlipidemia, unspecified: Secondary | ICD-10-CM

## 2011-04-18 DIAGNOSIS — I119 Hypertensive heart disease without heart failure: Secondary | ICD-10-CM

## 2011-04-24 ENCOUNTER — Ambulatory Visit: Payer: Medicare Other | Admitting: Cardiology

## 2011-05-09 LAB — BASIC METABOLIC PANEL
BUN: 12
CO2: 29
Calcium: 8.6
GFR calc non Af Amer: >60
Glucose, Bld: 174 — ABNORMAL HIGH

## 2011-05-09 LAB — URINALYSIS, ROUTINE W REFLEX MICROSCOPIC
Bilirubin Urine: NEGATIVE
Hgb urine dipstick: NEGATIVE
Ketones, ur: NEGATIVE
Nitrite: NEGATIVE
Protein, ur: NEGATIVE
Urobilinogen, UA: 0.2

## 2011-05-09 LAB — COMPREHENSIVE METABOLIC PANEL
Alkaline Phosphatase: 51
BUN: 13
CO2: 29
GFR calc non Af Amer: >60
Glucose, Bld: 139 — ABNORMAL HIGH
Potassium: 3.8
Total Bilirubin: 1.2
Total Protein: 6.5

## 2011-05-09 LAB — DIFFERENTIAL
Basophils Absolute: 0.2 — ABNORMAL HIGH
Basophils Relative: 1
Monocytes Relative: 6
Neutro Abs: 11.4 — ABNORMAL HIGH
Neutrophils Relative %: 83 — ABNORMAL HIGH

## 2011-05-09 LAB — CBC
HCT: 44.7
Hemoglobin: 15.4
MCHC: 34.7
Platelets: 232
RDW: 13.5
RDW: 13.6

## 2011-05-09 LAB — PROTIME-INR
INR: 1
Prothrombin Time: 13.1

## 2011-05-09 LAB — APTT: aPTT: 26

## 2011-06-02 ENCOUNTER — Other Ambulatory Visit: Payer: Self-pay | Admitting: Cardiology

## 2011-06-02 NOTE — Telephone Encounter (Signed)
Refilled lisinopril

## 2011-07-07 ENCOUNTER — Other Ambulatory Visit: Payer: Self-pay | Admitting: Cardiology

## 2011-07-07 ENCOUNTER — Other Ambulatory Visit (INDEPENDENT_AMBULATORY_CARE_PROVIDER_SITE_OTHER): Payer: Medicare Other | Admitting: *Deleted

## 2011-07-07 DIAGNOSIS — E785 Hyperlipidemia, unspecified: Secondary | ICD-10-CM

## 2011-07-07 DIAGNOSIS — I119 Hypertensive heart disease without heart failure: Secondary | ICD-10-CM

## 2011-07-07 LAB — HEPATIC FUNCTION PANEL
AST: 25 U/L (ref 0–37)
Alkaline Phosphatase: 60 U/L (ref 39–117)
Bilirubin, Direct: 0.1 mg/dL (ref 0.0–0.3)

## 2011-07-07 LAB — BASIC METABOLIC PANEL
CO2: 31 mEq/L (ref 19–32)
Calcium: 9 mg/dL (ref 8.4–10.5)
GFR: 105.19 mL/min (ref >60.00)
Potassium: 3.9 mEq/L (ref 3.5–5.1)
Sodium: 143 mEq/L (ref 135–145)

## 2011-07-07 LAB — LIPID PANEL
HDL: 52.9 mg/dL (ref >39.00)
Total CHOL/HDL Ratio: 4
VLDL: 43.6 mg/dL — ABNORMAL HIGH (ref 0.0–40.0)

## 2011-07-07 LAB — LDL CHOLESTEROL, DIRECT: Direct LDL: 103.7 mg/dL

## 2011-07-10 ENCOUNTER — Ambulatory Visit (INDEPENDENT_AMBULATORY_CARE_PROVIDER_SITE_OTHER): Payer: Medicare Other | Admitting: Cardiology

## 2011-07-10 ENCOUNTER — Encounter: Payer: Self-pay | Admitting: Cardiology

## 2011-07-10 VITALS — BP 147/72 | HR 60 | Ht 75.0 in | Wt 201.4 lb

## 2011-07-10 DIAGNOSIS — E119 Type 2 diabetes mellitus without complications: Secondary | ICD-10-CM

## 2011-07-10 DIAGNOSIS — E785 Hyperlipidemia, unspecified: Secondary | ICD-10-CM

## 2011-07-10 DIAGNOSIS — M069 Rheumatoid arthritis, unspecified: Secondary | ICD-10-CM

## 2011-07-10 DIAGNOSIS — G4700 Insomnia, unspecified: Secondary | ICD-10-CM

## 2011-07-10 DIAGNOSIS — I119 Hypertensive heart disease without heart failure: Secondary | ICD-10-CM

## 2011-07-10 MED ORDER — TEMAZEPAM 30 MG PO CAPS: 30.0000 mg | ORAL_CAPSULE | Freq: Every evening | ORAL | Status: DC | PRN

## 2011-07-10 NOTE — Progress Notes (Signed)
[name] Date of Birth:  06/25/1936 [facility] / [facility] [address]   [address] [place], [place]  [address] [phone]           Fax   [phone]  History of Present Illness: This 75 year old gentleman is seen for a four-month followup office visit.  He has a past history of Central hypertension and benign PVCs.  He also has a history of dyslipidemia and diabetes mellitus.  Over the past 4 months he has been very inactive because of previous foot surgery to his right foot.  He had a fusion procedure of his talus to the femur.  With his inactivity he was off his diet and he has gained 8 pounds.  His lipids this time are not as good.  Current Outpatient Prescriptions  Medication Sig Dispense Refill  . amLODipine (NORVASC) 10 MG tablet Take 1 tablet by mouth Daily.      Marland Kitchen aspirin 81 MG tablet Take 81 mg by mouth daily.        . Cholecalciferol (VITAMIN D) 2000 UNITS tablet Take 2,000 Units by mouth daily.        . DULoxetine (CYMBALTA) 60 MG capsule Take 60 mg by mouth daily.        . fentaNYL (DURAGESIC - DOSED MCG/HR) 25 MCG/HR 1 patch as directed.      Marland Kitchen HYDROcodone-acetaminophen (LORTAB) 10-500 MG per tablet Take 1 tablet by mouth Ad lib.      . Lancets (ONETOUCH ULTRASOFT) lancets TEST BLOOD SUGAR AS DIRECTED  100 each  11  . leflunomide (ARAVA) 20 MG tablet Take 1 tablet by mouth Daily.      Marland Kitchen lisinopril (PRINIVIL,ZESTRIL) 10 MG tablet TAKE 1 TABLET BY MOUTH EVERY DAY  30 tablet  11  . metFORMIN (GLUCOPHAGE) 500 MG tablet Take 1 tablet by mouth Twice daily.      Marland Kitchen NASONEX 50 MCG/ACT nasal spray 1 spray as needed.       . ONE TOUCH ULTRA TEST test strip TEST BLOOD SUGAR AS DIRECTED  100 each  11  . predniSONE (DELTASONE) 5 MG tablet Take 5 mg by mouth daily.        . temazepam (RESTORIL) 30 MG capsule Take 1 capsule (30 mg total) by mouth at bedtime as needed.  30 capsule  3    Allergies  Allergen Reactions  . Sulfa Drugs Cross Reactors     Patient  Active Problem List  Diagnoses  . Polymyalgia rheumatica  . Benign hypertensive heart disease without heart failure  . Dyslipidemia  . Diabetes mellitus    History  Smoking status  . Never Smoker   Smokeless tobacco  . Never Used    History  Alcohol Use No    Family History  Problem Relation Age of Onset  . Heart disease Father     Review of Systems: Constitutional: no fever chills diaphoresis or fatigue or change in weight.  Head and neck: no hearing loss, no epistaxis, no photophobia or visual disturbance. Respiratory: No cough, shortness of breath or wheezing. Cardiovascular: No chest pain peripheral edema, palpitations. Gastrointestinal: No abdominal distention, no abdominal pain, no change in bowel habits hematochezia or melena. Genitourinary: No dysuria, no frequency, no urgency, no nocturia. Musculoskeletal:No arthralgias, no back pain, no gait disturbance or myalgias. Neurological: No dizziness, no headaches, no numbness, no seizures, no syncope, no weakness, no tremors. Hematologic: No lymphadenopathy, no easy bruising. Psychiatric: No confusion, no hallucinations, no sleep disturbance.  Physical Exam: Filed Vitals:   07/10/11 1618  BP: 147/72  Pulse: 60   the general appearance reveals a well-developed well-nourished gentleman in no distress.Pupils equal and reactive.   Extraocular Movements are full.  There is no scleral icterus.  The mouth and pharynx are normal.  The neck is supple.  The carotids reveal no bruits.  The jugular venous pressure is normal.  The thyroid is not enlarged.  There is no lymphadenopathy.  The chest is clear to percussion and auscultation. There are no rales or rhonchi. Expansion of the chest is symmetrical.  Heart reveals a soft systolic ejection murmur at the base.The abdomen is soft and nontender. Bowel sounds are normal. The liver and spleen are not enlarged. There Are no abdominal masses. There are no bruits.  The pedal  pulses are good.  There is no phlebitis.  Right foot is still significantly swollen postoperatively  There is no cyanosis or clubbing. Strength is normal and symmetrical in all extremities.  There is no lateralizing weakness.  There are no sensory deficits.  The skin is warm and dry.  There is no rash.    Assessment / Plan: Continue same medication.  Will Carter on weight loss and increasing activity.  Recheck in 4 months for office visit and fasting lab work including hemoglobin A1c

## 2011-07-10 NOTE — Assessment & Plan Note (Signed)
His diet has been less strength and his inactivity has allowed his triglycerides to go back into elevated levels.

## 2011-07-10 NOTE — Assessment & Plan Note (Signed)
The patient has not been experiencing any chest pain or shortness of breath.  He is not able to walk quickly because of his residual right foot discomfort.  His activity however is gradually increasing and he is hopeful of being able to lose some weight

## 2011-07-10 NOTE — Assessment & Plan Note (Signed)
His diabetes is not doing as well because of his weight gain and his inactivity.  We will plan to check a hemoglobin A1c next visit

## 2011-07-10 NOTE — Patient Instructions (Signed)
Your physician recommends that you continue on your current medications as directed. Please refer to the Current Medication list given to you today. Follow up for fasting labs on November 03, 2011 at 12 pm Follow up with  Dr. Patty Sermons on November 04, 2011 at 1:45 pm

## 2011-08-21 ENCOUNTER — Other Ambulatory Visit: Payer: Self-pay | Admitting: *Deleted

## 2011-08-21 ENCOUNTER — Other Ambulatory Visit: Payer: Self-pay | Admitting: Dermatology

## 2011-08-21 MED ORDER — METFORMIN HCL 500 MG PO TABS: ORAL_TABLET | ORAL | Status: DC

## 2011-08-21 NOTE — Telephone Encounter (Signed)
Refilled metformin

## 2011-08-31 ENCOUNTER — Ambulatory Visit (HOSPITAL_COMMUNITY)
Admission: RE | Admit: 2011-08-31 | Discharge: 2011-08-31 | Disposition: A | Discharge status: Home / Self Care | Payer: Medicare Other | Source: Ambulatory Visit | Attending: Orthopedic Surgery | Admitting: Orthopedic Surgery

## 2011-08-31 ENCOUNTER — Other Ambulatory Visit (HOSPITAL_COMMUNITY): Payer: Self-pay | Admitting: Orthopedic Surgery

## 2011-08-31 DIAGNOSIS — T5691XA Toxic effect of unspecified metal, accidental (unintentional), initial encounter: Secondary | ICD-10-CM

## 2011-08-31 DIAGNOSIS — Z1389 Encounter for screening for other disorder: Secondary | ICD-10-CM | POA: Insufficient documentation

## 2011-11-02 ENCOUNTER — Telehealth: Payer: Self-pay | Admitting: Cardiology

## 2011-11-02 NOTE — Telephone Encounter (Signed)
Spoke with patients wife and she called the wrong office

## 2011-11-02 NOTE — Telephone Encounter (Signed)
New msg Pt's wife called and wanted to discuss his meds regarding blood thinners. Please call

## 2011-11-03 ENCOUNTER — Other Ambulatory Visit (INDEPENDENT_AMBULATORY_CARE_PROVIDER_SITE_OTHER): Payer: Medicare Other

## 2011-11-03 DIAGNOSIS — E785 Hyperlipidemia, unspecified: Secondary | ICD-10-CM

## 2011-11-03 DIAGNOSIS — I119 Hypertensive heart disease without heart failure: Secondary | ICD-10-CM

## 2011-11-03 DIAGNOSIS — E119 Type 2 diabetes mellitus without complications: Secondary | ICD-10-CM

## 2011-11-03 LAB — BASIC METABOLIC PANEL
BUN: 14 mg/dL (ref 6–23)
Calcium: 9.2 mg/dL (ref 8.4–10.5)
GFR: 103.54 mL/min (ref >60.00)
Glucose, Bld: 119 mg/dL — ABNORMAL HIGH (ref 70–99)
Sodium: 140 mEq/L (ref 135–145)

## 2011-11-03 LAB — HEPATIC FUNCTION PANEL
ALT: 22 U/L (ref 0–53)
AST: 24 U/L (ref 0–37)
Total Bilirubin: 0.5 mg/dL (ref 0.3–1.2)

## 2011-11-03 LAB — LIPID PANEL: HDL: 53.3 mg/dL (ref >39.00)

## 2011-11-03 NOTE — Progress Notes (Signed)
Quick Note:  Please make copy of labs for patient visit. ______ 

## 2011-11-06 ENCOUNTER — Telehealth: Payer: Self-pay | Admitting: Cardiology

## 2011-11-06 NOTE — Telephone Encounter (Signed)
LOVx2,CT,Labs,Cath,12 faxed to [facility] @ [phone] 11/06/10/KM

## 2011-11-07 ENCOUNTER — Encounter: Payer: Self-pay | Admitting: Cardiology

## 2011-11-07 ENCOUNTER — Ambulatory Visit (INDEPENDENT_AMBULATORY_CARE_PROVIDER_SITE_OTHER): Payer: Medicare Other | Admitting: Cardiology

## 2011-11-07 DIAGNOSIS — G4700 Insomnia, unspecified: Secondary | ICD-10-CM

## 2011-11-07 DIAGNOSIS — M069 Rheumatoid arthritis, unspecified: Secondary | ICD-10-CM

## 2011-11-07 DIAGNOSIS — I119 Hypertensive heart disease without heart failure: Secondary | ICD-10-CM

## 2011-11-07 DIAGNOSIS — E119 Type 2 diabetes mellitus without complications: Secondary | ICD-10-CM

## 2011-11-07 MED ORDER — TEMAZEPAM 30 MG PO CAPS: 30.0000 mg | ORAL_CAPSULE | Freq: Every evening | ORAL | Status: DC | PRN

## 2011-11-07 NOTE — Patient Instructions (Signed)
Your physician recommends that you continue on your current medications as directed. Please refer to the Current Medication list given to you today. Your physician wants you to follow-up in: 4 months You will receive a reminder letter in the mail two months in advance. If you don't receive a letter, please call our office to schedule the follow-up appointment.  

## 2011-11-07 NOTE — Progress Notes (Signed)
[name] Date of Birth:  06-23-1936 [facility] [address] [place], [place]  [address] [phone]         Fax   [phone]  History of Present Illness: This pleasant 76 year old gentleman is seen for a four-month followup office visit.  He has a past history of essential hypertension, symptomatic PVCs, a past history of polymyalgia rheumatica, and a present history of severe rheumatoid arthritis.  He is followed closely by his rheumatologist Dr. Dareen Piano.  He has been having a lot of pain throughout his body and is on fentanyl patches and hydrocodone.  For his rheumatoid arthritis he is a trial of methotrexate without any improvement and he has had for subcutaneous weekly injections of Enbrel also with no improvement so far.  Current Outpatient Prescriptions  Medication Sig Dispense Refill  . amLODipine (NORVASC) 10 MG tablet Take 1 tablet by mouth Daily.      Marland Kitchen aspirin 81 MG tablet Take 81 mg by mouth daily.        . Cholecalciferol (VITAMIN D) 2000 UNITS tablet Take 2,000 Units by mouth daily.        . DULoxetine (CYMBALTA) 60 MG capsule Take 60 mg by mouth daily.        Marland Kitchen etanercept (ENBREL) 50 MG/ML injection Inject 50 mg into the skin once a week.      . fentaNYL (DURAGESIC - DOSED MCG/HR) 25 MCG/HR 1 patch as directed.      Marland Kitchen HYDROcodone-acetaminophen (LORTAB) 10-500 MG per tablet Take 1 tablet by mouth Ad lib.      . Lancets (ONETOUCH ULTRASOFT) lancets TEST BLOOD SUGAR AS DIRECTED  100 each  11  . leflunomide (ARAVA) 20 MG tablet Take 1 tablet by mouth Daily.      Marland Kitchen lisinopril (PRINIVIL,ZESTRIL) 10 MG tablet TAKE 1 TABLET BY MOUTH EVERY DAY  30 tablet  11  . metFORMIN (GLUCOPHAGE) 500 MG tablet Take 2 daily every morning  60 tablet  11  . NASONEX 50 MCG/ACT nasal spray 1 spray as needed.       . ONE TOUCH ULTRA TEST test strip TEST BLOOD SUGAR AS DIRECTED  100 each  11  . predniSONE (DELTASONE) 5 MG tablet Take 5 mg by mouth daily.        .  temazepam (RESTORIL) 30 MG capsule Take 1 capsule (30 mg total) by mouth at bedtime as needed.  90 capsule  1  . triamcinolone cream (KENALOG) 0.1 % As directed        Allergies  Allergen Reactions  . Sulfa Drugs Cross Reactors     Patient Active Problem List  Diagnoses  . Polymyalgia rheumatica  . Benign hypertensive heart disease without heart failure  . Dyslipidemia  . Diabetes mellitus  . Rheumatoid arthritis    History  Smoking status  . Never Smoker   Smokeless tobacco  . Never Used    History  Alcohol Use No    Family History  Problem Relation Age of Onset  . Heart disease Father     Review of Systems: Constitutional: no fever chills diaphoresis or fatigue or change in weight.  Head and neck: no hearing loss, no epistaxis, no photophobia or visual disturbance. Respiratory: No cough, shortness of breath or wheezing. Cardiovascular: No chest pain peripheral edema, palpitations. Gastrointestinal: No abdominal distention, no abdominal pain, no change in bowel habits hematochezia or melena. Genitourinary: No dysuria, no frequency, no urgency, no nocturia. Musculoskeletal:No arthralgias, no back pain, no  gait disturbance or myalgias. Neurological: No dizziness, no headaches, no numbness, no seizures, no syncope, no weakness, no tremors. Hematologic: No lymphadenopathy, no easy bruising. Psychiatric: No confusion, no hallucinations, no sleep disturbance.    Physical Exam: Filed Vitals:   11/07/11 1415  BP: 140/80  Pulse:    the general appearance reveals a well-developed well-nourished gentleman in no distress.Pupils equal and reactive.   Extraocular Movements are full.  There is no scleral icterus.  The mouth and pharynx are normal.  The neck is supple.  The carotids reveal no bruits.  The jugular venous pressure is normal.  The thyroid is not enlarged.  There is no lymphadenopathy.  The chest is clear to percussion and auscultation. There are no rales or  rhonchi. Expansion of the chest is symmetrical.  The precordium is quiet.  The first heart sound is normal.  The second heart sound is physiologically split.  There is no murmur gallop rub or click.  There is no abnormal lift or heave.  The abdomen is soft and nontender. Bowel sounds are normal. The liver and spleen are not enlarged. There Are no abdominal masses. There are no bruits.  Extremities show diffuse swelling of the fingers of both hands.  No significant pedal edema.Strength is normal and symmetrical in all extremities.  There is no lateralizing weakness.  There are no sensory deficits.  The skin is warm and dry.  There is no rash.    Assessment / Plan: Patient is to continue same medication.  Recheck in 4 months for office visit EKG and fasting lab work

## 2011-11-07 NOTE — Assessment & Plan Note (Signed)
The patient has not been experiencing any symptoms of congestive heart failure.  Is not having any headaches.  He has occasional dizzy spells.

## 2011-11-07 NOTE — Assessment & Plan Note (Signed)
The patient has a history of diabetes mellitus.  His hemoglobin A1c is only 6.2 which is satisfactory.  He has not been having any hypoglycemic episodes.

## 2011-11-07 NOTE — Assessment & Plan Note (Signed)
The patient is very discouraged and nothing has seemed to help his rheumatoid arthritis so far.  I have encouraged him that his rheumatologist undoubtedly still has several other medicines that can be tried.

## 2011-12-14 ENCOUNTER — Other Ambulatory Visit: Payer: Self-pay | Admitting: Cardiology

## 2011-12-14 NOTE — Telephone Encounter (Signed)
Refilled amlodipine 

## 2012-01-01 ENCOUNTER — Telehealth: Payer: Self-pay | Admitting: Cardiology

## 2012-01-01 ENCOUNTER — Other Ambulatory Visit: Payer: Self-pay | Admitting: *Deleted

## 2012-01-01 DIAGNOSIS — G4700 Insomnia, unspecified: Secondary | ICD-10-CM

## 2012-01-01 MED ORDER — TEMAZEPAM 30 MG PO CAPS: 30.0000 mg | ORAL_CAPSULE | Freq: Every evening | ORAL | Status: DC | PRN

## 2012-01-01 NOTE — Telephone Encounter (Signed)
Pt needs a 30 day supply of temazepam called into [facility] in [facility] he has 2 pills left

## 2012-01-01 NOTE — Telephone Encounter (Signed)
Called pharmacy and they stated pt does have refills and they will fill it for him. Pt is aware that pharmacy is filling his medication.

## 2012-01-25 ENCOUNTER — Other Ambulatory Visit: Payer: Self-pay | Admitting: Cardiology

## 2012-01-25 NOTE — Telephone Encounter (Signed)
Refilled one touch lancets.

## 2012-02-29 ENCOUNTER — Telehealth: Payer: Self-pay | Admitting: Cardiology

## 2012-02-29 NOTE — Telephone Encounter (Signed)
New msg Pt only wanted to talk to [facility] Please call him back

## 2012-03-01 NOTE — Telephone Encounter (Signed)
Left message to call back  

## 2012-03-04 NOTE — Telephone Encounter (Signed)
Spoke with patient.  He did not have a medical issue

## 2012-05-24 ENCOUNTER — Other Ambulatory Visit: Payer: Self-pay | Admitting: Cardiology

## 2012-05-24 ENCOUNTER — Other Ambulatory Visit: Payer: Self-pay | Admitting: *Deleted

## 2012-05-24 DIAGNOSIS — G4700 Insomnia, unspecified: Secondary | ICD-10-CM

## 2012-05-24 MED ORDER — TEMAZEPAM 30 MG PO CAPS: 30.0000 mg | ORAL_CAPSULE | Freq: Every evening | ORAL | Status: DC | PRN

## 2012-05-24 NOTE — Telephone Encounter (Signed)
Refilled Tamazepam.

## 2012-05-27 ENCOUNTER — Other Ambulatory Visit: Payer: Self-pay | Admitting: Cardiology

## 2012-05-28 ENCOUNTER — Telehealth: Payer: Self-pay | Admitting: Cardiology

## 2012-05-28 NOTE — Telephone Encounter (Signed)
Pt is having trouble getting temazepam and they have been faxing but we do not respond

## 2012-05-28 NOTE — Telephone Encounter (Signed)
Called to pharmacy as requested.  Have not seen a fax

## 2012-05-31 ENCOUNTER — Other Ambulatory Visit: Payer: Self-pay | Admitting: Cardiology

## 2012-06-03 ENCOUNTER — Other Ambulatory Visit (INDEPENDENT_AMBULATORY_CARE_PROVIDER_SITE_OTHER): Payer: Medicare Other

## 2012-06-03 DIAGNOSIS — E119 Type 2 diabetes mellitus without complications: Secondary | ICD-10-CM

## 2012-06-03 DIAGNOSIS — I119 Hypertensive heart disease without heart failure: Secondary | ICD-10-CM

## 2012-06-03 LAB — LIPID PANEL
Total CHOL/HDL Ratio: 4
Triglycerides: 240 mg/dL — ABNORMAL HIGH (ref 0.0–149.0)

## 2012-06-03 LAB — BASIC METABOLIC PANEL
BUN: 13 mg/dL (ref 6–23)
CO2: 32 mEq/L (ref 19–32)
Calcium: 8.9 mg/dL (ref 8.4–10.5)
Glucose, Bld: 105 mg/dL — ABNORMAL HIGH (ref 70–99)
Sodium: 140 mEq/L (ref 135–145)

## 2012-06-03 LAB — HEPATIC FUNCTION PANEL
AST: 23 U/L (ref 0–37)
Alkaline Phosphatase: 58 U/L (ref 39–117)
Bilirubin, Direct: 0.1 mg/dL (ref 0.0–0.3)
Total Bilirubin: 0.7 mg/dL (ref 0.3–1.2)

## 2012-06-04 NOTE — Progress Notes (Signed)
Quick Note:  Please make copy of labs for patient visit. ______ 

## 2012-06-06 ENCOUNTER — Encounter: Payer: Self-pay | Admitting: Cardiology

## 2012-06-06 ENCOUNTER — Ambulatory Visit (INDEPENDENT_AMBULATORY_CARE_PROVIDER_SITE_OTHER): Payer: Medicare Other | Admitting: Cardiology

## 2012-06-06 VITALS — BP 164/70 | HR 57 | Ht 75.0 in | Wt 208.1 lb

## 2012-06-06 DIAGNOSIS — I119 Hypertensive heart disease without heart failure: Secondary | ICD-10-CM

## 2012-06-06 DIAGNOSIS — E785 Hyperlipidemia, unspecified: Secondary | ICD-10-CM

## 2012-06-06 MED ORDER — LISINOPRIL 20 MG PO TABS: 20.0000 mg | ORAL_TABLET | Freq: Every day | ORAL | Status: DC

## 2012-06-06 NOTE — Progress Notes (Signed)
[name] Date of Birth:  1936/05/31 [facility] [address] [place], [place]  [address] [phone]         Fax   [phone]  History of Present Illness: This pleasant 76 year old gentleman is seen for a four-month followup office visit. He has a past history of essential hypertension, symptomatic PVCs, a past history of polymyalgia rheumatica, and a present history of severe rheumatoid arthritis. He is followed closely by his rheumatologist Dr. Dareen Piano. He has been having a lot of pain throughout his body .  Since last visit his rheumatoid arthritis has been worse.  He is about to start a new medication called Symphony injections which are very expensive but he was approved for patient financial assistance by the manufacturer.  Patient is also having more problems with his back.  Dr. Simonne Come has referred him to Dr. Ethelene Hal.  Patient also will be undergoing removal of some screws from his right ankle and near future.   Current Outpatient Prescriptions  Medication Sig Dispense Refill  . amLODipine (NORVASC) 10 MG tablet TAKE 1 TABLET BY MOUTH EVERY DAY  90 tablet  3  . aspirin 81 MG tablet Take 81 mg by mouth daily.        . Cholecalciferol (VITAMIN D) 2000 UNITS tablet Take 2,000 Units by mouth daily.        . DULoxetine (CYMBALTA) 60 MG capsule Take 60 mg by mouth daily.        . fentaNYL (DURAGESIC - DOSED MCG/HR) 25 MCG/HR 1 patch as directed.      Marland Kitchen HYDROcodone-acetaminophen (LORTAB) 10-500 MG per tablet Take 1 tablet by mouth Ad lib.      . Lancets (ONETOUCH ULTRASOFT) lancets TEST BLOOD SUGAR AS DIRECTED  100 each  11  . leflunomide (ARAVA) 20 MG tablet Take 1 tablet by mouth Daily.      Marland Kitchen lisinopril (PRINIVIL,ZESTRIL) 20 MG tablet Take 1 tablet (20 mg total) by mouth daily.  90 tablet  3  . metFORMIN (GLUCOPHAGE) 500 MG tablet Take 2 daily every morning  60 tablet  11  . NASONEX 50 MCG/ACT nasal spray 1 spray as needed.       . ONE TOUCH ULTRA TEST  test strip TEST BLOOD SUGAR AS DIRECTED  100 each  0  . predniSONE (DELTASONE) 5 MG tablet Take 5 mg by mouth daily.        . temazepam (RESTORIL) 30 MG capsule TAKE ONE CAPSULE BY MOUTH AT BEDTIME AS NEEDED  30 capsule  4  . temazepam (RESTORIL) 30 MG capsule Take 1 capsule (30 mg total) by mouth at bedtime as needed.  30 capsule  5  . triamcinolone cream (KENALOG) 0.1 % As directed      . DISCONTD: lisinopril (PRINIVIL,ZESTRIL) 10 MG tablet TAKE 1 TABLET BY MOUTH EVERY DAY  30 tablet  11  . DISCONTD: temazepam (RESTORIL) 30 MG capsule Take 1 capsule (30 mg total) by mouth at bedtime as needed.  90 capsule  1    Allergies  Allergen Reactions  . Sulfa Drugs Cross Reactors     Patient Active Problem List  Diagnosis  . Polymyalgia rheumatica  . Benign hypertensive heart disease without heart failure  . Dyslipidemia  . Diabetes mellitus  . Rheumatoid arthritis    History  Smoking status  . Never Smoker   Smokeless tobacco  . Never Used    History  Alcohol Use No    Family History  Problem Relation  Age of Onset  . Heart disease Father     Review of Systems: Constitutional: no fever chills diaphoresis or fatigue or change in weight.  Head and neck: no hearing loss, no epistaxis, no photophobia or visual disturbance. Respiratory: No cough, shortness of breath or wheezing. Cardiovascular: No chest pain peripheral edema, palpitations. Gastrointestinal: No abdominal distention, no abdominal pain, no change in bowel habits hematochezia or melena. Genitourinary: No dysuria, no frequency, no urgency, no nocturia. Musculoskeletal:No arthralgias, no back pain, no gait disturbance or myalgias. Neurological: No dizziness, no headaches, no numbness, no seizures, no syncope, no weakness, no tremors. Hematologic: No lymphadenopathy, no easy bruising. Psychiatric: No confusion, no hallucinations, no sleep disturbance.    Physical Exam: Filed Vitals:   06/06/12 1127  BP: 164/70    Pulse: 57   the general appearance reveals a well-developed well-nourished gentleman in no acute distress.  He walks with a cane.The head and neck exam reveals pupils equal and reactive.  Extraocular movements are full.  There is no scleral icterus.  The mouth and pharynx are normal.  The neck is supple.  The carotids reveal no bruits.  The jugular venous pressure is normal.  The  thyroid is not enlarged.  There is no lymphadenopathy.  The chest is clear to percussion and auscultation.  There are no rales or rhonchi.  Expansion of the chest is symmetrical.  The precordium is quiet.  The first heart sound is normal.  The second heart sound is physiologically split.  There is no  gallop rub or click.  There is a grade 2/6 systolic ejection murmur at the base There is no abnormal lift or heave.  The abdomen is soft and nontender.  The bowel sounds are normal.  The liver and spleen are not enlarged.  There are no abdominal masses.  There are no abdominal bruits.  Extremities reveal good pedal pulses.  There is edema of the right ankle. There is no cyanosis or clubbing.  Strength is normal and symmetrical in all extremities.  There is no lateralizing weakness.  There are no sensory deficits.  The skin is warm and dry.  There is no rash.   EKG shows sinus bradycardia with increased T wave inversion in the anterolateral leads when compared with 01/14/11  Assessment / Plan: The patient's electrocardiogram has worsened and his cardiac exam reveals a basilar systolic ejection murmur.  We will update his echocardiogram to be sure that he is not developing significant aortic stenosis. Patient will be rechecked for a followup visit in 4 months with CBC lipid panel hepatic function panel and basal metabolic panel

## 2012-06-06 NOTE — Patient Instructions (Signed)
Your physician has requested that you have an echocardiogram. Echocardiography is a painless test that uses sound waves to create images of your heart. It provides your doctor with information about the size and shape of your heart and how well your heart's chambers and valves are working. This procedure takes approximately one hour. There are no restrictions for this procedure.  Your physician recommends that you schedule a follow-up appointment in: 4 months with fasting labs (lp/bmet/hfp/cbc)   INCREASE YOUR LISINOPRIL TO 20 MG DAILY (USE UP ALL OF YOUR  10 MG 2 DAILY, NEW RX SENT TO PHARMACY)

## 2012-06-06 NOTE — Assessment & Plan Note (Signed)
The patient is in constant pain.  He attributes his high blood pressure today to the fact that he is in more pain.  We are going to increase his lisinopril up to 20 mg daily to help with blood pressure control.

## 2012-06-06 NOTE — Assessment & Plan Note (Signed)
We reviewed his recent labs which show improvement in cholesterol but elevation in triglycerides.  Patient will continue on a heart healthy diet.

## 2012-06-06 NOTE — Assessment & Plan Note (Signed)
The patient has not been experiencing any hypoglycemic episodes from his diabetes.

## 2012-06-11 ENCOUNTER — Ambulatory Visit (HOSPITAL_COMMUNITY)
Admission: RE | Admit: 2012-06-11 | Discharge: 2012-06-11 | Disposition: A | Discharge status: Home / Self Care | Payer: Medicare Other | Source: Ambulatory Visit | Attending: Orthopedic Surgery | Admitting: Orthopedic Surgery

## 2012-06-11 ENCOUNTER — Other Ambulatory Visit (HOSPITAL_COMMUNITY): Payer: Medicare Other

## 2012-06-11 ENCOUNTER — Other Ambulatory Visit (HOSPITAL_COMMUNITY): Payer: Self-pay | Admitting: Orthopedic Surgery

## 2012-06-11 DIAGNOSIS — M545 Low back pain: Secondary | ICD-10-CM

## 2012-06-11 DIAGNOSIS — Z1389 Encounter for screening for other disorder: Secondary | ICD-10-CM | POA: Insufficient documentation

## 2012-06-13 ENCOUNTER — Ambulatory Visit (HOSPITAL_COMMUNITY): Payer: Medicare Other | Attending: Cardiology

## 2012-06-13 DIAGNOSIS — M353 Polymyalgia rheumatica: Secondary | ICD-10-CM | POA: Insufficient documentation

## 2012-06-13 DIAGNOSIS — I517 Cardiomegaly: Secondary | ICD-10-CM | POA: Insufficient documentation

## 2012-06-13 DIAGNOSIS — I059 Rheumatic mitral valve disease, unspecified: Secondary | ICD-10-CM | POA: Insufficient documentation

## 2012-06-13 DIAGNOSIS — E119 Type 2 diabetes mellitus without complications: Secondary | ICD-10-CM | POA: Insufficient documentation

## 2012-06-13 DIAGNOSIS — R011 Cardiac murmur, unspecified: Secondary | ICD-10-CM | POA: Insufficient documentation

## 2012-06-13 DIAGNOSIS — E785 Hyperlipidemia, unspecified: Secondary | ICD-10-CM | POA: Insufficient documentation

## 2012-06-13 DIAGNOSIS — I10 Essential (primary) hypertension: Secondary | ICD-10-CM | POA: Insufficient documentation

## 2012-06-13 DIAGNOSIS — I119 Hypertensive heart disease without heart failure: Secondary | ICD-10-CM

## 2012-06-13 DIAGNOSIS — I359 Nonrheumatic aortic valve disorder, unspecified: Secondary | ICD-10-CM | POA: Insufficient documentation

## 2012-06-13 NOTE — Progress Notes (Signed)
Echocardiogram performed.  

## 2012-06-14 ENCOUNTER — Telehealth: Payer: Self-pay | Admitting: *Deleted

## 2012-06-14 NOTE — Telephone Encounter (Signed)
Message copied by Burnell Blanks on Fri Jun 14, 2012  3:17 PM ------      Message from: Cassell Clement      Created: Fri Jun 14, 2012  9:09 AM       Please report.  The left ventricular function is normal.  The heart murmur is coming from a slightly thickened aortic valve.  There is no significant aortic stenosis.  Continue same medication.

## 2012-06-14 NOTE — Telephone Encounter (Signed)
Advised patient of results.  

## 2012-06-24 ENCOUNTER — Other Ambulatory Visit: Payer: Self-pay | Admitting: Cardiology

## 2012-06-24 DIAGNOSIS — G4700 Insomnia, unspecified: Secondary | ICD-10-CM

## 2012-06-24 NOTE — Telephone Encounter (Signed)
New problem:   Need a rx for 90 tablet.  90 is the same price as the 30 pills.

## 2012-06-25 MED ORDER — TEMAZEPAM 30 MG PO CAPS: 30.0000 mg | ORAL_CAPSULE | Freq: Every evening | ORAL | Status: DC | PRN

## 2012-06-25 NOTE — Telephone Encounter (Signed)
Left message to call back  

## 2012-06-25 NOTE — Telephone Encounter (Signed)
Pt called back asking for refill of  Temazepam [facility] day supply [facility] [place]

## 2012-06-29 ENCOUNTER — Other Ambulatory Visit: Payer: Self-pay | Admitting: Orthopedic Surgery

## 2012-07-05 ENCOUNTER — Encounter (HOSPITAL_COMMUNITY): Payer: Self-pay | Admitting: Pharmacy Technician

## 2012-07-08 ENCOUNTER — Encounter (HOSPITAL_COMMUNITY)
Admission: RE | Admit: 2012-07-08 | Discharge: 2012-07-08 | Disposition: A | Discharge status: Home / Self Care | Payer: Medicare Other | Source: Ambulatory Visit | Attending: Orthopedic Surgery | Admitting: Orthopedic Surgery

## 2012-07-08 ENCOUNTER — Ambulatory Visit (HOSPITAL_COMMUNITY)
Admission: RE | Admit: 2012-07-08 | Discharge: 2012-07-08 | Disposition: A | Discharge status: Home / Self Care | Payer: Medicare Other | Source: Ambulatory Visit | Attending: Orthopedic Surgery | Admitting: Orthopedic Surgery

## 2012-07-08 ENCOUNTER — Encounter (HOSPITAL_COMMUNITY): Payer: Self-pay

## 2012-07-08 DIAGNOSIS — I10 Essential (primary) hypertension: Secondary | ICD-10-CM | POA: Insufficient documentation

## 2012-07-08 DIAGNOSIS — Z01818 Encounter for other preprocedural examination: Secondary | ICD-10-CM | POA: Insufficient documentation

## 2012-07-08 HISTORY — DX: Anemia, unspecified: D64.9

## 2012-07-08 LAB — BASIC METABOLIC PANEL
CO2: 32 mEq/L (ref 19–32)
Chloride: 104 mEq/L (ref 96–112)
Creatinine, Ser: 0.8 mg/dL (ref 0.50–1.35)
Glucose, Bld: 104 mg/dL — ABNORMAL HIGH (ref 70–99)
Sodium: 143 mEq/L (ref 135–145)

## 2012-07-08 LAB — CBC
Hemoglobin: 13.9 g/dL (ref 13.0–17.0)
MCV: 88.2 fL (ref 78.0–100.0)
Platelets: 248 10*3/uL (ref 150–400)
RBC: 4.74 MIL/uL (ref 4.22–5.81)
WBC: 6.2 10*3/uL (ref 4.0–10.5)

## 2012-07-08 NOTE — Progress Notes (Signed)
10/13 EKG in [facility]  Last office visit with DR Brackbill 06/06/12 [facility]  06/13/12 ECHO in [facility]

## 2012-07-08 NOTE — Patient Instructions (Signed)
20 [name]  07/08/2012   Your procedure is scheduled on:  07/17/12 215pm-445pm  Report to [facility] at 1145 AM.  Call this number if you have problems the morning of surgery: [phone]   Remember:   Do not eat food:After Midnight.  May have clear liquids:until 0730am then npo .  Clear liquids include soda, tea, black coffee, apple or grape juice, broth.  Take these medicines the morning of surgery with A SIP OF WATER:    Do not wear jewelry,   Do not wear lotions, powders, or perfumes. .  . Men may shave face and neck.  Do not bring valuables to the hospital.  Contacts, dentures or bridgework may not be worn into surgery.  Leave suitcase in the car. After surgery it may be brought to your room.  For patients admitted to the hospital, checkout time is 11:00 AM the day of discharge.                SEE CHG INSTRUCTION SHEET    Please read over the following fact sheets that you were given: MRSA Information, coughing and deep breathing exercises, leg exercises

## 2012-07-17 ENCOUNTER — Ambulatory Visit (HOSPITAL_COMMUNITY): Payer: Medicare Other

## 2012-07-17 ENCOUNTER — Inpatient Hospital Stay (HOSPITAL_COMMUNITY)
Admission: RE | Admit: 2012-07-17 | Discharge: 2012-07-22 | DRG: 491 | Disposition: A | Discharge status: Home Health | Payer: Medicare Other | Source: Ambulatory Visit | Attending: Orthopedic Surgery | Admitting: Orthopedic Surgery

## 2012-07-17 ENCOUNTER — Encounter (HOSPITAL_COMMUNITY)
Admission: RE | Disposition: A | Discharge status: Home Health | Payer: Self-pay | Source: Ambulatory Visit | Attending: Orthopedic Surgery

## 2012-07-17 ENCOUNTER — Encounter (HOSPITAL_COMMUNITY): Payer: Self-pay | Admitting: *Deleted

## 2012-07-17 ENCOUNTER — Ambulatory Visit (HOSPITAL_COMMUNITY): Payer: Medicare Other | Admitting: Anesthesiology

## 2012-07-17 ENCOUNTER — Encounter (HOSPITAL_COMMUNITY): Payer: Self-pay | Admitting: Anesthesiology

## 2012-07-17 DIAGNOSIS — R339 Retention of urine, unspecified: Secondary | ICD-10-CM | POA: Diagnosis not present

## 2012-07-17 DIAGNOSIS — M48061 Spinal stenosis, lumbar region without neurogenic claudication: Principal | ICD-10-CM | POA: Diagnosis present

## 2012-07-17 DIAGNOSIS — IMO0002 Reserved for concepts with insufficient information to code with codable children: Secondary | ICD-10-CM

## 2012-07-17 DIAGNOSIS — I2510 Atherosclerotic heart disease of native coronary artery without angina pectoris: Secondary | ICD-10-CM | POA: Diagnosis present

## 2012-07-17 DIAGNOSIS — I499 Cardiac arrhythmia, unspecified: Secondary | ICD-10-CM | POA: Diagnosis present

## 2012-07-17 DIAGNOSIS — M353 Polymyalgia rheumatica: Secondary | ICD-10-CM | POA: Diagnosis present

## 2012-07-17 DIAGNOSIS — E119 Type 2 diabetes mellitus without complications: Secondary | ICD-10-CM

## 2012-07-17 DIAGNOSIS — D649 Anemia, unspecified: Secondary | ICD-10-CM | POA: Diagnosis not present

## 2012-07-17 DIAGNOSIS — G8918 Other acute postprocedural pain: Secondary | ICD-10-CM | POA: Diagnosis not present

## 2012-07-17 DIAGNOSIS — I10 Essential (primary) hypertension: Secondary | ICD-10-CM | POA: Diagnosis present

## 2012-07-17 HISTORY — PX: DECOMPRESSIVE LUMBAR LAMINECTOMY LEVEL 3: SHX5793

## 2012-07-17 LAB — GLUCOSE, CAPILLARY: Glucose-Capillary: 123 mg/dL — ABNORMAL HIGH (ref 70–99)

## 2012-07-17 LAB — HEMOGLOBIN AND HEMATOCRIT, BLOOD
HCT: 35 % — ABNORMAL LOW (ref 39.0–52.0)
Hemoglobin: 11.8 g/dL — ABNORMAL LOW (ref 13.0–17.0)

## 2012-07-17 SURGERY — DECOMPRESSIVE LUMBAR LAMINECTOMY LEVEL 3
Anesthesia: General | Site: Back | Wound class: Clean

## 2012-07-17 MED ORDER — SUFENTANIL CITRATE 50 MCG/ML IV SOLN
INTRAVENOUS | Status: DC | PRN
  Administered 2012-07-17 (×2): 5 ug via INTRAVENOUS
  Administered 2012-07-17: 10 ug via INTRAVENOUS
  Administered 2012-07-17 (×4): 5 ug via INTRAVENOUS
  Administered 2012-07-17: 10 ug via INTRAVENOUS
  Administered 2012-07-17 (×2): 5 ug via INTRAVENOUS
  Administered 2012-07-17: 10 ug via INTRAVENOUS

## 2012-07-17 MED ORDER — SUCCINYLCHOLINE CHLORIDE 20 MG/ML IJ SOLN
INTRAMUSCULAR | Status: DC | PRN
  Administered 2012-07-17: 100 mg via INTRAVENOUS

## 2012-07-17 MED ORDER — LEFLUNOMIDE 20 MG PO TABS
20.0000 mg | ORAL_TABLET | Freq: Every morning | ORAL | Status: DC
  Administered 2012-07-18 – 2012-07-21 (×4): 20 mg via ORAL
  Filled 2012-07-17 (×7): qty 1

## 2012-07-17 MED ORDER — ONETOUCH ULTRASOFT LANCETS MISC: 1.0000 | Freq: Two times a day (BID) | Status: DC

## 2012-07-17 MED ORDER — HYDROMORPHONE HCL PF 1 MG/ML IJ SOLN
INTRAMUSCULAR | Status: AC
  Filled 2012-07-17: qty 1

## 2012-07-17 MED ORDER — DIPHENHYDRAMINE HCL 12.5 MG/5ML PO ELIX: 12.5000 mg | ORAL_SOLUTION | Freq: Four times a day (QID) | ORAL | Status: DC | PRN

## 2012-07-17 MED ORDER — LACTATED RINGERS IV SOLN: INTRAVENOUS | Status: DC

## 2012-07-17 MED ORDER — PROMETHAZINE HCL 25 MG/ML IJ SOLN: 6.2500 mg | INTRAMUSCULAR | Status: DC | PRN

## 2012-07-17 MED ORDER — MIDAZOLAM HCL 5 MG/5ML IJ SOLN
INTRAMUSCULAR | Status: DC | PRN
  Administered 2012-07-17 (×3): 1 mg via INTRAVENOUS

## 2012-07-17 MED ORDER — OXYMETAZOLINE HCL 0.05 % NA SOLN
2.0000 | Freq: Every day | NASAL | Status: DC
  Administered 2012-07-17 – 2012-07-21 (×5): 2 via NASAL
  Filled 2012-07-17: qty 15

## 2012-07-17 MED ORDER — NALOXONE HCL 0.4 MG/ML IJ SOLN: 0.4000 mg | INTRAMUSCULAR | Status: DC | PRN

## 2012-07-17 MED ORDER — LACTATED RINGERS IV SOLN
INTRAVENOUS | Status: DC | PRN
  Administered 2012-07-17: 15:00:00 via INTRAVENOUS

## 2012-07-17 MED ORDER — CEFAZOLIN SODIUM-DEXTROSE 2-3 GM-% IV SOLR
2.0000 g | INTRAVENOUS | Status: AC
  Administered 2012-07-17: 2 g via INTRAVENOUS

## 2012-07-17 MED ORDER — GLUCOSE BLOOD VI STRP: 1.0000 | ORAL_STRIP | Freq: Two times a day (BID) | Status: DC

## 2012-07-17 MED ORDER — METHOCARBAMOL 100 MG/ML IJ SOLN
500.0000 mg | Freq: Once | INTRAVENOUS | Status: AC
  Administered 2012-07-17: 500 mg via INTRAVENOUS
  Filled 2012-07-17: qty 5

## 2012-07-17 MED ORDER — ROCURONIUM BROMIDE 100 MG/10ML IV SOLN
INTRAVENOUS | Status: DC | PRN
  Administered 2012-07-17: 50 mg via INTRAVENOUS
  Administered 2012-07-17: 5 mg via INTRAVENOUS

## 2012-07-17 MED ORDER — SODIUM CHLORIDE 0.9 % IJ SOLN: 9.0000 mL | INTRAMUSCULAR | Status: DC | PRN

## 2012-07-17 MED ORDER — DEXTROSE IN LACTATED RINGERS 5 % IV SOLN
INTRAVENOUS | Status: DC
  Administered 2012-07-17: via INTRAVENOUS
  Administered 2012-07-18: 1000 mL via INTRAVENOUS
  Administered 2012-07-20: 20 mL/h via INTRAVENOUS

## 2012-07-17 MED ORDER — HYDROMORPHONE HCL PF 1 MG/ML IJ SOLN
0.2500 mg | INTRAMUSCULAR | Status: DC | PRN
  Administered 2012-07-17 (×5): 0.5 mg via INTRAVENOUS

## 2012-07-17 MED ORDER — SODIUM CHLORIDE 0.9 % IV SOLN: 250.0000 mL | INTRAVENOUS | Status: DC

## 2012-07-17 MED ORDER — LISINOPRIL 20 MG PO TABS
20.0000 mg | ORAL_TABLET | Freq: Every morning | ORAL | Status: DC
  Administered 2012-07-18 – 2012-07-21 (×4): 20 mg via ORAL
  Filled 2012-07-17 (×5): qty 1

## 2012-07-17 MED ORDER — METFORMIN HCL 500 MG PO TABS
1000.0000 mg | ORAL_TABLET | Freq: Every day | ORAL | Status: DC
  Administered 2012-07-18 – 2012-07-22 (×5): 1000 mg via ORAL
  Filled 2012-07-17 (×6): qty 2

## 2012-07-17 MED ORDER — ACETAMINOPHEN 10 MG/ML IV SOLN
INTRAVENOUS | Status: DC | PRN
  Administered 2012-07-17: 1000 mg via INTRAVENOUS

## 2012-07-17 MED ORDER — PNEUMOCOCCAL VAC POLYVALENT 25 MCG/0.5ML IJ INJ
0.5000 mL | INJECTION | INTRAMUSCULAR | Status: AC
  Administered 2012-07-18: 0.5 mL via INTRAMUSCULAR
  Filled 2012-07-17: qty 0.5

## 2012-07-17 MED ORDER — ONDANSETRON HCL 4 MG/2ML IJ SOLN: 4.0000 mg | INTRAMUSCULAR | Status: DC | PRN

## 2012-07-17 MED ORDER — LACTATED RINGERS IV SOLN
INTRAVENOUS | Status: DC | PRN
  Administered 2012-07-17 (×3): via INTRAVENOUS

## 2012-07-17 MED ORDER — ADULT MULTIVITAMIN W/MINERALS CH
1.0000 | ORAL_TABLET | Freq: Two times a day (BID) | ORAL | Status: DC
  Administered 2012-07-17 – 2012-07-21 (×4): 1 via ORAL
  Filled 2012-07-17 (×11): qty 1

## 2012-07-17 MED ORDER — TEMAZEPAM 15 MG PO CAPS
30.0000 mg | ORAL_CAPSULE | Freq: Every day | ORAL | Status: DC
  Administered 2012-07-17 – 2012-07-21 (×5): 30 mg via ORAL
  Filled 2012-07-17 (×2): qty 2
  Filled 2012-07-17: qty 1
  Filled 2012-07-17: qty 2
  Filled 2012-07-17: qty 1
  Filled 2012-07-17: qty 2

## 2012-07-17 MED ORDER — ONDANSETRON HCL 4 MG/2ML IJ SOLN: 4.0000 mg | Freq: Four times a day (QID) | INTRAMUSCULAR | Status: DC | PRN

## 2012-07-17 MED ORDER — ASPIRIN EC 81 MG PO TBEC
81.0000 mg | DELAYED_RELEASE_TABLET | Freq: Every day | ORAL | Status: DC
  Administered 2012-07-17 – 2012-07-21 (×5): 81 mg via ORAL
  Filled 2012-07-17 (×6): qty 1

## 2012-07-17 MED ORDER — HYDROCORTISONE SOD SUCCINATE 100 MG IJ SOLR
INTRAMUSCULAR | Status: DC | PRN
  Administered 2012-07-17: 100 mg via INTRAVENOUS

## 2012-07-17 MED ORDER — DULOXETINE HCL 60 MG PO CPEP
60.0000 mg | ORAL_CAPSULE | Freq: Every day | ORAL | Status: DC
  Administered 2012-07-17 – 2012-07-21 (×5): 60 mg via ORAL
  Filled 2012-07-17 (×6): qty 1

## 2012-07-17 MED ORDER — PREDNISONE 5 MG PO TABS
5.0000 mg | ORAL_TABLET | Freq: Every day | ORAL | Status: DC
  Administered 2012-07-18 – 2012-07-22 (×4): 5 mg via ORAL
  Filled 2012-07-17 (×6): qty 1

## 2012-07-17 MED ORDER — ACETAMINOPHEN 10 MG/ML IV SOLN
INTRAVENOUS | Status: AC
  Filled 2012-07-17: qty 100

## 2012-07-17 MED ORDER — LIDOCAINE HCL (CARDIAC) 20 MG/ML IV SOLN
INTRAVENOUS | Status: DC | PRN
  Administered 2012-07-17: 30 mg via INTRAVENOUS

## 2012-07-17 MED ORDER — SODIUM CHLORIDE 0.9 % IJ SOLN: 3.0000 mL | INTRAMUSCULAR | Status: DC | PRN

## 2012-07-17 MED ORDER — CEFAZOLIN SODIUM-DEXTROSE 2-3 GM-% IV SOLR
INTRAVENOUS | Status: AC
  Filled 2012-07-17: qty 50

## 2012-07-17 MED ORDER — HYDROMORPHONE 0.3 MG/ML IV SOLN
INTRAVENOUS | Status: DC
  Administered 2012-07-17: 19:00:00 via INTRAVENOUS
  Administered 2012-07-17: 1.5 mg via INTRAVENOUS
  Administered 2012-07-18 (×2): 2.4 mg via INTRAVENOUS
  Administered 2012-07-18: 4.2 mg via INTRAVENOUS
  Administered 2012-07-18: 01:00:00 via INTRAVENOUS
  Administered 2012-07-18: 4.2 mg via INTRAVENOUS
  Administered 2012-07-18: 10:00:00 via INTRAVENOUS
  Administered 2012-07-18: 4.3 mg via INTRAVENOUS
  Administered 2012-07-18: 1.5 mg via INTRAVENOUS
  Administered 2012-07-19: 2.1 mg via INTRAVENOUS
  Administered 2012-07-19: 4.2 mg via INTRAVENOUS
  Administered 2012-07-19: 1.5 mg via INTRAVENOUS
  Administered 2012-07-19: 01:00:00 via INTRAVENOUS
  Administered 2012-07-19: 3.3 mg via INTRAVENOUS
  Administered 2012-07-19: 3.5 mg via INTRAVENOUS
  Administered 2012-07-19: 0.6 mg via INTRAVENOUS
  Administered 2012-07-20: 2.675 mg via INTRAVENOUS
  Administered 2012-07-20: 0.9 mg via INTRAVENOUS
  Administered 2012-07-20: 0.6 mg via INTRAVENOUS
  Filled 2012-07-17 (×5): qty 25

## 2012-07-17 MED ORDER — MENTHOL 3 MG MT LOZG: 1.0000 | LOZENGE | OROMUCOSAL | Status: DC | PRN

## 2012-07-17 MED ORDER — SODIUM CHLORIDE 0.9 % IJ SOLN
3.0000 mL | Freq: Two times a day (BID) | INTRAMUSCULAR | Status: DC
  Administered 2012-07-17 – 2012-07-21 (×3): 3 mL via INTRAVENOUS

## 2012-07-17 MED ORDER — LIDOCAINE HCL 4 % MT SOLN
OROMUCOSAL | Status: DC | PRN
  Administered 2012-07-17: 4 mL via TOPICAL

## 2012-07-17 MED ORDER — HYDROCORTISONE SOD SUCCINATE 100 MG IJ SOLR
INTRAMUSCULAR | Status: AC
  Filled 2012-07-17: qty 2

## 2012-07-17 MED ORDER — CEFAZOLIN SODIUM-DEXTROSE 2-3 GM-% IV SOLR
2.0000 g | Freq: Three times a day (TID) | INTRAVENOUS | Status: AC
  Administered 2012-07-17 – 2012-07-18 (×2): 2 g via INTRAVENOUS
  Filled 2012-07-17 (×2): qty 50

## 2012-07-17 MED ORDER — HETASTARCH-ELECTROLYTES 6 % IV SOLN
INTRAVENOUS | Status: DC | PRN
  Administered 2012-07-17: 15:00:00 via INTRAVENOUS

## 2012-07-17 MED ORDER — THROMBIN 5000 UNITS EX SOLR
CUTANEOUS | Status: AC
  Filled 2012-07-17: qty 10000

## 2012-07-17 MED ORDER — AMLODIPINE BESYLATE 10 MG PO TABS
10.0000 mg | ORAL_TABLET | Freq: Every morning | ORAL | Status: DC
  Administered 2012-07-18 – 2012-07-21 (×4): 10 mg via ORAL
  Filled 2012-07-17 (×5): qty 1

## 2012-07-17 MED ORDER — POVIDONE-IODINE 7.5 % EX SOLN: Freq: Once | CUTANEOUS | Status: DC

## 2012-07-17 MED ORDER — HYDROMORPHONE 0.3 MG/ML IV SOLN
INTRAVENOUS | Status: AC
  Filled 2012-07-17: qty 25

## 2012-07-17 MED ORDER — DIPHENHYDRAMINE HCL 50 MG/ML IJ SOLN: 12.5000 mg | Freq: Four times a day (QID) | INTRAMUSCULAR | Status: DC | PRN

## 2012-07-17 MED ORDER — ATROPINE SULFATE 0.4 MG/ML IJ SOLN
INTRAMUSCULAR | Status: DC | PRN
  Administered 2012-07-17: .4 mg via INTRAVENOUS

## 2012-07-17 MED ORDER — METHOCARBAMOL 500 MG PO TABS
500.0000 mg | ORAL_TABLET | Freq: Four times a day (QID) | ORAL | Status: DC | PRN
  Administered 2012-07-18 – 2012-07-22 (×15): 500 mg via ORAL
  Filled 2012-07-17 (×15): qty 1

## 2012-07-17 MED ORDER — THROMBIN 5000 UNITS EX SOLR
CUTANEOUS | Status: DC | PRN
  Administered 2012-07-17: 5000 [IU] via TOPICAL

## 2012-07-17 MED ORDER — HYDROMORPHONE HCL PF 1 MG/ML IJ SOLN
0.2500 mg | INTRAMUSCULAR | Status: AC | PRN
Start: 2012-07-17 — End: 2012-07-17
  Administered 2012-07-17 (×4): 0.5 mg via INTRAVENOUS
  Administered 2012-07-17 (×4): 0.25 mg via INTRAVENOUS

## 2012-07-17 MED ORDER — PROPOFOL 10 MG/ML IV BOLUS
INTRAVENOUS | Status: DC | PRN
  Administered 2012-07-17: 30 mg via INTRAVENOUS
  Administered 2012-07-17: 170 mg via INTRAVENOUS

## 2012-07-17 MED ORDER — FENTANYL 25 MCG/HR TD PT72
25.0000 ug | MEDICATED_PATCH | TRANSDERMAL | Status: DC
  Administered 2012-07-17: 25 ug via TRANSDERMAL
  Filled 2012-07-17: qty 1

## 2012-07-17 MED ORDER — B COMPLEX-C PO TABS
1.0000 | ORAL_TABLET | Freq: Two times a day (BID) | ORAL | Status: DC
  Administered 2012-07-17 – 2012-07-21 (×4): 1 via ORAL
  Filled 2012-07-17 (×11): qty 1

## 2012-07-17 MED ORDER — PHENOL 1.4 % MT LIQD: 1.0000 | OROMUCOSAL | Status: DC | PRN

## 2012-07-17 SURGICAL SUPPLY — 47 items
APL SKNCLS STERI-STRIP NONHPOA (GAUZE/BANDAGES/DRESSINGS) ×1
BAG SPEC THK2 15X12 ZIP CLS (MISCELLANEOUS) ×1
BAG ZIPLOCK 12X15 (MISCELLANEOUS) ×2 IMPLANT
BENZOIN TINCTURE PRP APPL 2/3 (GAUZE/BANDAGES/DRESSINGS) ×2 IMPLANT
BUR EGG ELITE 4.0 (BURR) ×1 IMPLANT
BUR OVAL CARBIDE 4.0 (BURR) ×1 IMPLANT
CLEANER TIP ELECTROSURG 2X2 (MISCELLANEOUS) ×2 IMPLANT
CLOTH BEACON ORANGE TIMEOUT ST (SAFETY) ×2 IMPLANT
CONT SPEC 4OZ CLIKSEAL STRL BL (MISCELLANEOUS) ×2 IMPLANT
DRAIN PENROSE 18X1/4 LTX STRL (WOUND CARE) ×1 IMPLANT
DRAPE MICROSCOPE LEICA (MISCELLANEOUS) ×2 IMPLANT
DRAPE POUCH INSTRU U-SHP 10X18 (DRAPES) ×2 IMPLANT
DRAPE SURG 17X11 SM STRL (DRAPES) ×2 IMPLANT
DRSG ADAPTIC 3X8 NADH LF (GAUZE/BANDAGES/DRESSINGS) ×2 IMPLANT
DRSG PAD ABDOMINAL 8X10 ST (GAUZE/BANDAGES/DRESSINGS) ×2 IMPLANT
DURAPREP 26ML APPLICATOR (WOUND CARE) ×2 IMPLANT
ELECT BLADE TIP CTD 4 INCH (ELECTRODE) ×2 IMPLANT
ELECT REM PT RETURN 9FT ADLT (ELECTROSURGICAL) ×2
ELECTRODE REM PT RTRN 9FT ADLT (ELECTROSURGICAL) ×1 IMPLANT
GLOVE BIO SURGEON STRL SZ8 (GLOVE) ×4 IMPLANT
GLOVE ECLIPSE 8.0 STRL XLNG CF (GLOVE) ×3 IMPLANT
GLOVE INDICATOR 8.0 STRL GRN (GLOVE) ×4 IMPLANT
GOWN STRL REIN XL XLG (GOWN DISPOSABLE) ×3 IMPLANT
KIT BASIN OR (CUSTOM PROCEDURE TRAY) ×2 IMPLANT
KIT POSITIONING SURG ANDREWS (MISCELLANEOUS) ×1 IMPLANT
MANIFOLD NEPTUNE II (INSTRUMENTS) ×2 IMPLANT
NDL SPNL 18GX3.5 QUINCKE PK (NEEDLE) ×3 IMPLANT
NEEDLE SPNL 18GX3.5 QUINCKE PK (NEEDLE) ×6 IMPLANT
NS IRRIG 1000ML POUR BTL (IV SOLUTION) ×2 IMPLANT
PATTIES SURGICAL .5 X.5 (GAUZE/BANDAGES/DRESSINGS) IMPLANT
PATTIES SURGICAL .75X.75 (GAUZE/BANDAGES/DRESSINGS) IMPLANT
PATTIES SURGICAL 1X1 (DISPOSABLE) IMPLANT
PIN SAFETY NICK PLATE  2 MED (MISCELLANEOUS) ×1
PIN SAFETY NICK PLATE 2 MED (MISCELLANEOUS) IMPLANT
POSITIONER SURGICAL ARM (MISCELLANEOUS) ×2 IMPLANT
SLEEVE SURGEON STRL (DRAPES) ×1 IMPLANT
SPONGE GAUZE 4X4 12PLY (GAUZE/BANDAGES/DRESSINGS) ×1 IMPLANT
SPONGE LAP 4X18 X RAY DECT (DISPOSABLE) ×7 IMPLANT
SPONGE SURGIFOAM ABS GEL 100 (HEMOSTASIS) ×2 IMPLANT
STAPLER VISISTAT 35W (STAPLE) IMPLANT
SUT VIC AB 1 CT1 27 (SUTURE) ×4
SUT VIC AB 1 CT1 27XBRD ANTBC (SUTURE) ×2 IMPLANT
SUT VIC AB 2-0 CT1 27 (SUTURE) ×4
SUT VIC AB 2-0 CT1 27XBRD (SUTURE) ×2 IMPLANT
TOWEL OR 17X26 10 PK STRL BLUE (TOWEL DISPOSABLE) ×4 IMPLANT
TRAY LAMINECTOMY (CUSTOM PROCEDURE TRAY) ×2 IMPLANT
WATER STERILE IRR 1500ML POUR (IV SOLUTION) ×1 IMPLANT

## 2012-07-17 NOTE — H&P (Signed)
[name] is an 76 y.o. male.   Chief Complaint: low back and bilat leg pain HPI: prior decompressive lamin L2-S in 2010 with recurrent progressive low back and bilat leg pain the last few years.  MRI demonstrates recurrent stenosis L3-S, esp at L4-5  Past Medical History  Diagnosis Date  . Hypertension   . Polymyalgia rheumatica   . Fatigue   . Aortic valve disease   . Coronary artery disease     cardiac catheterization 2005 / EF 50%  . Diabetes mellitus   . Hyperlipidemia   . PVC's (premature ventricular contractions)   . Sciatica   . Heart murmur   . DDD (degenerative disc disease)     rheumatoid arthritis   . Anemia     Past Surgical History  Procedure Date  . Cardiac catheterization 2005    EF 50% /  showed minimal coronary atherosclerosisi   . Retinal detachment surgery   . Hydrocele excision     of the right testicle  . Fusion right foot      2nd toe   . Right carpal tunnel    . Left knee surgery      left knee quadricip surgery   . Back surgery     for spinal stenosis   . Left middle finger surgery      due to saw   . Right ankle fusion      Family History  Problem Relation Age of Onset  . Heart disease Father    Social History:  reports that he has never smoked. He has never used smokeless tobacco. He reports that he does not drink alcohol or use illicit drugs.  Allergies:  Allergies  Allergen Reactions  . Sulfa Drugs Cross Reactors Other (See Comments)    Unknown     Medications Prior to Admission  Medication Sig Dispense Refill  . amLODipine (NORVASC) 10 MG tablet Take 10 mg by mouth every morning.      . B Complex-C (B-COMPLEX WITH VITAMIN C) tablet Take 1 tablet by mouth 2 (two) times daily.      . Calcium Citrate-Vitamin D (CITRACAL + D PO) Take 1 tablet by mouth 2 (two) times daily.      . cholecalciferol (VITAMIN D) 1000 UNITS tablet Take 2,000 Units by mouth 2 (two) times daily.      . ferrous sulfate 325 (65 FE) MG tablet Take 325 mg  by mouth 2 (two) times daily.      . Garlic 1000 MG CAPS Take 1,000 mg by mouth 2 (two) times daily.      Marland Kitchen glucosamine-chondroitin (COSAMIN DS) 500-400 MG tablet Take 1 tablet by mouth 2 (two) times daily.      Marland Kitchen HYDROcodone-acetaminophen (LORTAB) 10-500 MG per tablet Take 1 tablet by mouth every 4 (four) hours as needed. Pain      . metFORMIN (GLUCOPHAGE) 500 MG tablet Take 1,000 mg by mouth daily with breakfast.      . Methylsulfonylmethane (MSM) 1000 MG CAPS Take 1,000 mg by mouth daily.      . Multiple Vitamin (MULTIVITAMIN WITH MINERALS) TABS Take 1 tablet by mouth 2 (two) times daily.      Marland Kitchen NASONEX 50 MCG/ACT nasal spray Place 1 spray into the nose daily as needed. Allergies      . Omega-3 Fatty Acids (FISH OIL) 1200 MG CAPS Take 1,200 mg by mouth 2 (two) times daily.      Marland Kitchen oxymetazoline (AFRIN) 0.05 % nasal  spray Place 2 sprays into the nose daily. As needed      . predniSONE (DELTASONE) 5 MG tablet Take 5 mg by mouth daily.       . temazepam (RESTORIL) 30 MG capsule Take 30 mg by mouth at bedtime.      Marland Kitchen aspirin EC 81 MG tablet Take 81 mg by mouth at bedtime.      . DULoxetine (CYMBALTA) 60 MG capsule Take 60 mg by mouth at bedtime.       . fentaNYL (DURAGESIC - DOSED MCG/HR) 25 MCG/HR Place 1 patch onto the skin every other day.       . Golimumab (SIMPONI) 100 MG/ML SOLN Inject 100 mg into the skin every 30 (thirty) days. Last dose per pt was 06/10/12      . Lancets (ONETOUCH ULTRASOFT) lancets TEST BLOOD SUGAR AS DIRECTED  100 each  11  . leflunomide (ARAVA) 20 MG tablet Take 20 mg by mouth every morning.       Marland Kitchen lisinopril (PRINIVIL,ZESTRIL) 20 MG tablet Take 20 mg by mouth every morning.      . ONE TOUCH ULTRA TEST test strip TEST BLOOD SUGAR AS DIRECTED  100 each  0  . triamcinolone cream (KENALOG) 0.1 % Apply 1 application topically 2 (two) times daily as needed. Rash        Results for orders placed during the hospital encounter of 07/17/12 (from the past 48 hour(s))   GLUCOSE, CAPILLARY     Status: Abnormal   Collection Time   07/17/12 12:01 PM      Component Value Range Comment   Glucose-Capillary 123 (*) 70 - 99 mg/dL    No results found.  Review of Systems  Constitutional: Negative.     Blood pressure 158/65, pulse 57, temperature 97.9 F (36.6 C), temperature source Oral, resp. rate 16, SpO2 100.00%. Physical Exam  Constitutional: He is oriented to person, place, and time. He appears well-developed and well-nourished.  HENT:  Head: Normocephalic and atraumatic.  Right Ear: External ear normal.  Left Ear: External ear normal.  Nose: Nose normal.  Mouth/Throat: Oropharynx is clear and moist.  Eyes: Conjunctivae normal and EOM are normal. Pupils are equal, round, and reactive to light.  Neck: Normal range of motion. Neck supple.  Cardiovascular: Normal rate, regular rhythm, normal heart sounds and intact distal pulses.   Respiratory: Effort normal and breath sounds normal.  GI: Soft. Bowel sounds are normal.  Musculoskeletal: He exhibits tenderness.       Decreased motion at waist with diffuse tenderness.  No numbness in feet.  Neurological: He is alert and oriented to person, place, and time. He has normal reflexes.  Skin: Skin is warm and dry.  Psychiatric: He has a normal mood and affect. His behavior is normal. Judgment and thought content normal.     Assessment/Plan Low back and bilat leg pain due to recurrent spinal stenosis L3 to sacrum Repeat decompression L3-S   APLINGTON,JAMES P 07/17/2012, 1:15 PM

## 2012-07-17 NOTE — Brief Op Note (Signed)
07/17/2012  6:23 PM  PATIENT:  Rolene Arbour  76 y.o. male  PRE-OPERATIVE DIAGNOSIS:  SPINAL STENOSIS  POST-OPERATIVE DIAGNOSIS:  recurrent stenosis  PROCEDURE:  Procedure(s) (LRB) with comments: DECOMPRESSIVE LUMBAR LAMINECTOMY LEVEL 3 (N/A) - L3-4, L4-5 AND L5-S1  SURGEON:  Surgeon(s) and Role:    * Drucilla Schmidt, MD - Primary    * Jacki Cones, MD - Assisting  PHYSICIAN ASSISTANT:   ASSISTANTS: nurse  ANESTHESIA:   general  EBL:  Total I/O In: 3400 [I.V.:2900; IV Piggyback:500] Out: 1200 [Urine:250; Blood:950]  BLOOD ADMINISTERED:none  DRAINS: Penrose drain in the left flank   LOCAL MEDICATIONS USED:  NONE  SPECIMEN:  No Specimen  DISPOSITION OF SPECIMEN:  N/A  COUNTS:  YES  TOURNIQUET:  * No tourniquets in log *  DICTATION: .Other Dictation: Dictation Number [id number]  PLAN OF CARE: Admit to inpatient   PATIENT DISPOSITION:  PACU - hemodynamically stable.   Delay start of Pharmacological VTE agent (>24hrs) due to surgical blood loss or risk of bleeding: yes

## 2012-07-17 NOTE — Progress Notes (Signed)
Dr Rica Mast notified pt reports pain at 8 despite 2mg  IV Dilaudid.  Ordered to continue Dilaudid up to 4 mg IV and administer IV Robaxin.

## 2012-07-17 NOTE — Anesthesia Preprocedure Evaluation (Addendum)
Anesthesia Evaluation  Patient identified by MRN, date of birth, ID band Patient awake    Reviewed: Allergy & Precautions, H&P , NPO status , Patient's Chart, lab work & pertinent test results  Airway Mallampati: II TM Distance: >3 FB Neck ROM: Full    Dental  (+) Teeth Intact and Dental Advisory Given   Pulmonary neg pulmonary ROS,  breath sounds clear to auscultation  Pulmonary exam normal       Cardiovascular hypertension, Pt. on medications + CAD + dysrhythmias + Valvular Problems/Murmurs Rhythm:Regular Rate:Normal     Neuro/Psych  Neuromuscular disease negative psych ROS   GI/Hepatic negative GI ROS, Neg liver ROS,   Endo/Other  diabetes, Type 2, Oral Hypoglycemic Agents  Renal/GU negative Renal ROS  negative genitourinary   Musculoskeletal  (+) Arthritis -,   Abdominal   Peds  Hematology negative hematology ROS (+)   Anesthesia Other Findings   Reproductive/Obstetrics negative OB ROS                          Anesthesia Physical Anesthesia Plan  ASA: III  Anesthesia Plan: General   Post-op Pain Management:    Induction: Intravenous  Airway Management Planned: Oral ETT  Additional Equipment:   Intra-op Plan:   Post-operative Plan: Extubation in OR  Informed Consent: I have reviewed the patients History and Physical, chart, labs and discussed the procedure including the risks, benefits and alternatives for the proposed anesthesia with the patient or authorized representative who has indicated his/her understanding and acceptance.   Dental advisory given  Plan Discussed with: CRNA  Anesthesia Plan Comments:         Anesthesia Quick Evaluation

## 2012-07-17 NOTE — Transfer of Care (Signed)
Immediate Anesthesia Transfer of Care Note  Patient: [name]  Procedure(s) Performed: Procedure(s) (LRB) with comments: DECOMPRESSIVE LUMBAR LAMINECTOMY LEVEL 3 (N/A) - L3-4, L4-5 AND L5-S1  Patient Location: PACU  Anesthesia Type:General  Level of Consciousness: awake, alert , sedated and patient cooperative  Airway & Oxygen Therapy: Patient Spontanous Breathing and Patient connected to face mask oxygen  Post-op Assessment: Report given to PACU RN and Post -op Vital signs reviewed and stable  Post vital signs: Reviewed and stable  Complications: No apparent anesthesia complications

## 2012-07-18 ENCOUNTER — Encounter (HOSPITAL_COMMUNITY): Payer: Self-pay | Admitting: Orthopedic Surgery

## 2012-07-18 LAB — HEMOGLOBIN AND HEMATOCRIT, BLOOD: Hemoglobin: 10.1 g/dL — ABNORMAL LOW (ref 13.0–17.0)

## 2012-07-18 LAB — POCT I-STAT 4, (NA,K, GLUC, HGB,HCT)
Glucose, Bld: 123 mg/dL — ABNORMAL HIGH (ref 70–99)
HCT: 39 % (ref 39.0–52.0)
Hemoglobin: 13.3 g/dL (ref 13.0–17.0)
Potassium: 3.6 mEq/L (ref 3.5–5.1)

## 2012-07-18 LAB — GLUCOSE, CAPILLARY: Glucose-Capillary: 139 mg/dL — ABNORMAL HIGH (ref 70–99)

## 2012-07-18 MED ORDER — HYDROCODONE-ACETAMINOPHEN 10-325 MG PO TABS
1.0000 | ORAL_TABLET | ORAL | Status: DC | PRN
  Administered 2012-07-18 – 2012-07-22 (×21): 1 via ORAL
  Filled 2012-07-18 (×22): qty 1

## 2012-07-18 MED ORDER — ACETAMINOPHEN 325 MG PO TABS
650.0000 mg | ORAL_TABLET | Freq: Four times a day (QID) | ORAL | Status: DC | PRN
  Administered 2012-07-18: 325 mg via ORAL
  Administered 2012-07-18: 650 mg via ORAL
  Filled 2012-07-18: qty 1

## 2012-07-18 MED ORDER — FENTANYL 25 MCG/HR TD PT72
25.0000 ug | MEDICATED_PATCH | TRANSDERMAL | Status: DC
  Administered 2012-07-19 – 2012-07-21 (×2): 25 ug via TRANSDERMAL
  Filled 2012-07-18 (×2): qty 1

## 2012-07-18 MED ORDER — ACETAMINOPHEN 325 MG PO TABS
ORAL_TABLET | ORAL | Status: AC
  Administered 2012-07-18: 650 mg via ORAL
  Filled 2012-07-18: qty 2

## 2012-07-18 NOTE — Progress Notes (Signed)
Physical Therapy Note  Pt declined PT eval again this afternoon stating he was not getting OOB until MD visits.  Zenovia Jarred, PT Pager: [phone]

## 2012-07-18 NOTE — Progress Notes (Signed)
Utilization review completed.  

## 2012-07-18 NOTE — Progress Notes (Signed)
PT/OT Note  Attempted PT/OT evaluations. Pt declined to participate due to experiencing too much pain. Pt stated he will not attempt OOB until he sees MD and there is no need to check back before then. Will reattempt eval as schedule permits. Thanks.  Rebeca Alert, PT [phone] Mariam Dollar, OTR/L [phone]

## 2012-07-18 NOTE — Progress Notes (Signed)
Patient ID: [name], male   DOB: 03/12/1936, 76 y.o.   MRN: [id number] POD 1--leg pain is gone but pain control from back is a problem.  Hgb is 10.1 today.  Drain removed and back redressed.

## 2012-07-18 NOTE — Op Note (Signed)
[name], [name] NO.:  [id number]  MEDICAL RECORD NO.:  [id number]  LOCATION:  1617                         FACILITY:  [facility]  PHYSICIAN:  Marlowe Kays, M.D.  DATE OF BIRTH:  09-10-1935  DATE OF PROCEDURE:  07/17/2012 DATE OF DISCHARGE:                              OPERATIVE REPORT   PREOPERATIVE DIAGNOSIS:  Recurrent spinal stenosis L3-4, L4-5 and L5-S1.  POSTOPERATIVE DIAGNOSIS:  Recurrent spinal stenosis L3-4, L4-5 and L5- S1.  OPERATION:  Lateral decompressive laminectomy L3-4, L4-5, and L5-S1.  SURGEON:  Marlowe Kays, M.D.  ASSISTANT:  Georges Lynch. Gioffre, MD.  ANESTHESIA:  General.  PATHOLOGY AND JUSTIFICATION FOR PROCEDURE:  Approximately 4 years ago, he had decompressive laminectomy from L2 to the sacrum and did well for several years.  Over the last several years; however, he has had progressive pain not only in his back, in his buttocks, but also in the both legs.  A recent repeat lumbar MRI has demonstrated the above diagnoses.  He had intolerance to original myelogram prior to his original surgery and consequently new myelogram was performed.  The major pathology appeared to be at L4-5.  PROCEDURE:  Prophylactic antibiotics, satisfied general anesthesia, Foley catheter inserted, prone position on rolls.  Back was prepped with DuraPrep, draped in sterile field.  Ioban employed.  Time-out performed. I went through the old incision and was able to palpate the sacrum with bone marrow.  Mainly with cutting cautery, I then developed a tissue plane between the dura and overlying fibrous tissue and the lateral muscle soft tissue.  The preliminary x-ray was taken indicating up to the L3-4 level which we marked.  I then began difficulty dissection working first on the left side and removing the bone lateral to the palpable residual bone adjacent to the spinal canal, when I then worked medially trying to protect the spinal canal and injury.  We  worked up to the L3-4 level.  The primary problem appeared to be lateral bone at L4-5 and this was removed with combination of instruments including 2 and 3 mm Kerrison rongeur, double-action rongeur, osteotome and mallet.  There was a little difficult to differentiate exactly the neural foramen at L5- S1 with foraminal stenosis noted on the MRI because of the postoperative deformities.  After concluding the left side, we went ahead and performed a similar procedure on the right side.  I felt particularly the L4-5 on the right is a large fragment of bone adjacent to the spinal canal which we were able to remove.  This may have been the major deforming force based on the MRI findings.  At the conclusion of the case, there were no abnormalities noted.  Because of rather diffuse bleeding throughout the case, I elected to use a quarter Penrose drain with safety pin through the left posterior buttock area.  I then carefully closed the after irrigating the wound well and I carefully closed the wound under direct visualization around the Penrose drain.  A #1 Vicryl in the fascia and deep subcutaneous tissue 2-0 Vicryl superficial subcutaneous tissue and staples in the skin.  Betadine Adaptic dry sterile dressing were applied.  He tolerated the  procedure well with no known complications.  Estimated blood loss was perhaps 1200 mL with no blood replacement.          ______________________________ Marlowe Kays, M.D.     JA/MEDQ  D:  07/17/2012  T:  07/18/2012  Job:  [id number]

## 2012-07-19 LAB — GLUCOSE, CAPILLARY
Glucose-Capillary: 138 mg/dL — ABNORMAL HIGH (ref 70–99)
Glucose-Capillary: 141 mg/dL — ABNORMAL HIGH (ref 70–99)
Glucose-Capillary: 145 mg/dL — ABNORMAL HIGH (ref 70–99)

## 2012-07-19 LAB — HEMOGLOBIN AND HEMATOCRIT, BLOOD: Hemoglobin: 9.6 g/dL — ABNORMAL LOW (ref 13.0–17.0)

## 2012-07-19 NOTE — Evaluation (Addendum)
Physical Therapy Evaluation Patient Details Name: [name] MRN: [id number] DOB: [birth year] Today's Date: 07/19/2012 Time: 3086-5784 PT Time Calculation (min): 36 min  PT Assessment / Plan / Recommendation Clinical Impression  76 yo male s/p laminectomy L3-S1-POD 2. Pt declined OOB on POD 1. Increased encourgement from RN to get pt to participate with therapy. RN present during PT eval. On eval, pt was S for all mobility. Pt self-directed session and performed tasks/mobility in his own preferred manner, regardless of therapist's cues/suggestions. Pt conversed with RN mostly with very little interaction with therapists.  Pt declined HHPT for follow-up. 1x eval. No further acute PT needs. Will sign off.      PT Assessment  Patent does not need any further PT services    Follow Up Recommendations  No PT follow up    Does the patient have the potential to tolerate intense rehabilitation      Barriers to Discharge        Equipment Recommendations  None recommended by PT    Recommendations for Other Services OT consult   Frequency      Precautions / Restrictions Precautions Precautions: Back Precaution Comments: back handout issued on 07/18/12. Pt repeatedly states his is familiar due to all his previous surgeries. Restrictions Weight Bearing Restrictions: No   Pertinent Vitals/Pain 5/10 back.      Mobility  Bed Mobility Bed Mobility: Rolling Left;Left Sidelying to Sit Rolling Left: 5: Supervision Left Sidelying to Sit: 5: Supervision Details for Bed Mobility Assistance: Pt preferred to perform mobility in his own manner/time.  Transfers Transfers: Sit to Stand;Stand to Sit Sit to Stand: 5: Supervision;From bed;With upper extremity assist Stand to Sit: 5: Supervision;To chair/3-in-1;With armrests;With upper extremity assist Details for Transfer Assistance: Pt preferred to perform mobility in his own manner/time.  Ambulation/Gait Ambulation/Gait Assistance: 5:  Supervision Ambulation Distance (Feet): 285 Feet Assistive device: Rolling walker Ambulation/Gait Assistance Details: VCs safety,distance from RW. However pt did not make corrections. Repeatedly lifted walker in air (off all 4 legs) during walk.  Gait Pattern: Step-through pattern    Shoulder Instructions     Exercises     PT Diagnosis:    PT Problem List:   PT Treatment Interventions:     PT Goals    Visit Information  Last PT Received On: 07/19/12 Assistance Needed: +1    Subjective Data  Subjective: "I'm at about a 5 all the time." Patient Stated Goal: Home   Prior Functioning  Home Living Lives With: Spouse Available Help at Discharge: Family Type of Home: House Home Access:  (unsure if level entry or ramp-pt wouldn't state) Home Layout: Multi-level;Able to live on main level with bedroom/bathroom Bathroom Toilet: Handicapped height Home Adaptive Equipment: Bedside commode/3-in-1;Walker - rolling;Straight cane Additional Comments: pt refused to answer questions directly -"my house is handicap accessible. that answers all your questions." Prior Function Level of Independence: Independent with assistive device(s) Able to Take Stairs?: No Comments: using cane intermittently for R ankle pain Communication Communication: No difficulties    Cognition  Overall Cognitive Status: Appears within functional limits for tasks assessed/performed Arousal/Alertness: Awake/alert Orientation Level: Appears intact for tasks assessed Behavior During Session: [facility] for tasks performed    Extremity/Trunk Assessment Right Lower Extremity Assessment RLE ROM/Strength/Tone: Deficits RLE ROM/Strength/Tone Deficits: decreased ankle ROM,otherwise WFL Left Lower Extremity Assessment LLE ROM/Strength/Tone: WFL for tasks assessed   Balance    End of Session PT - End of Session Activity Tolerance: Patient tolerated treatment well Patient left: in chair;with call  bell/phone within reach;with  family/visitor present  GP     Rebeca Alert [facility] 07/19/2012, 11:37 AM [phone]

## 2012-07-19 NOTE — Anesthesia Postprocedure Evaluation (Signed)
Anesthesia Post Note  Patient: [name]  Procedure(s) Performed: Procedure(s) (LRB): DECOMPRESSIVE LUMBAR LAMINECTOMY LEVEL 3 (N/A)  Anesthesia type: General  Patient location: PACU  Post pain: Pain level controlled  Post assessment: Post-op Vital signs reviewed  Last Vitals:  Filed Vitals:   07/19/12 1031  BP: 148/69  Pulse:   Temp:   Resp:     Post vital signs: Reviewed  Level of consciousness: sedated  Complications: No apparent anesthesia complications

## 2012-07-19 NOTE — Progress Notes (Signed)
Patient ID: [name], male   DOB: 09/27/1935, 76 y.o.   MRN: [id number] POD 2--slept well last night and pain is down.  Hgb-9.6.  Had fever to 101 plus last night but afebrile this am.  Foley out today.

## 2012-07-19 NOTE — Evaluation (Signed)
Occupational Therapy Evaluation Patient Details Name: [name] MRN: [id number] DOB: [birth year] Today's Date: 07/19/2012 Time: 1000-1026 OT Time Calculation (min): 26 min  OT Assessment / Plan / Recommendation Clinical Impression  76 yo male s/p laminectomy L3-S1-POD 2. Pt declined OOB on POD 1. Increased encourgement from RN to get pt to participate with therapy.  Pt self-directed session and performed tasks/mobility in his own preferred manner, regardless of therapist's cues/suggestions. Pt conversed with RN mostly with very little interaction with therapists. Pt presents with no further OT needs at this time. Will sign off.    OT Assessment  Patient does not need any further OT services    Follow Up Recommendations  No OT follow up    Barriers to Discharge      Equipment Recommendations  None recommended by OT    Recommendations for Other Services    Frequency       Precautions / Restrictions Precautions Precautions: Back Precaution Comments: back handout issued on 07/18/12  Restrictions Weight Bearing Restrictions: No   Pertinent Vitals/Pain Reported 5/10 back pain. RN made aware.    ADL  Grooming: Simulated;Supervision/safety Where Assessed - Grooming: Supported standing Lower Body Bathing: Simulated;Minimal assistance Where Assessed - Lower Body Bathing: Supported sit to stand Lower Body Dressing: Simulated;Maximal assistance Where Assessed - Lower Body Dressing: Supported sit to Pharmacist, hospital: Buyer, retail Method: Sit to Barista: Other (comment) (recliner) ADL Comments: Pt's wife stated she would be assisting him at home. Pt would barely acknowledge therapist during session.     OT Diagnosis:    OT Problem List:   OT Treatment Interventions:     OT Goals    Visit Information  Last OT Received On: 07/19/12 Assistance Needed: +1    Subjective Data  Subjective: I'm in charge here. Patient  Stated Goal: Not asked.   Prior Functioning     Home Living Lives With: Spouse Available Help at Discharge: Family Type of Home: House Home Access:  (unsure if level entry or ramp-pt wouldn't state) Home Layout: Multi-level;Able to live on main level with bedroom/bathroom Bathroom Toilet: Handicapped height Home Adaptive Equipment: Bedside commode/3-in-1;Walker - rolling;Straight cane Additional Comments: pt refused to answer questions directly -"my house is handicap accessible. that answers all your questions." Prior Function Level of Independence: Independent with assistive device(s) Able to Take Stairs?: No Comments: using cane intermittently for R ankle pain Communication Communication: No difficulties         Vision/Perception     Cognition  Overall Cognitive Status: Appears within functional limits for tasks assessed/performed Arousal/Alertness: Awake/alert Orientation Level: Appears intact for tasks assessed Behavior During Session: [facility] for tasks performed    Extremity/Trunk Assessment Right Upper Extremity Assessment RUE ROM/Strength/Tone: [facility] for tasks assessed Left Upper Extremity Assessment LUE ROM/Strength/Tone: WFL for tasks assessed Right Lower Extremity Assessment RLE ROM/Strength/Tone: Deficits RLE ROM/Strength/Tone Deficits: decreased ankle ROM,otherwise WFL Left Lower Extremity Assessment LLE ROM/Strength/Tone: WFL for tasks assessed     Mobility Bed Mobility Bed Mobility: Rolling Left;Left Sidelying to Sit Rolling Left: 5: Supervision Left Sidelying to Sit: 5: Supervision Details for Bed Mobility Assistance: Pt preferred to perform mobility in his own manner/time.  Transfers Sit to Stand: 5: Supervision;From bed;With upper extremity assist Stand to Sit: 5: Supervision;To chair/3-in-1;With armrests;With upper extremity assist Details for Transfer Assistance: Pt preferred to perform mobility in his own manner/time.      Shoulder Instructions      Exercise     Balance  End of Session OT - End of Session Activity Tolerance: Patient tolerated treatment well Patient left: in chair;with call bell/phone within reach;with family/visitor present  GO     [name] A OTR/L [phone] 07/19/2012, 11:56 AM

## 2012-07-19 NOTE — Progress Notes (Signed)
PT/OT Note  3rd attempt for evaluations/OOB. Pt declined to attempt this am. Spoke with RN about this as well. Will check back later as schedule permits. Thanks. Rebeca Alert, PT [phone] Garrel Ridgel, OTR/L [phone]

## 2012-07-19 NOTE — Progress Notes (Signed)
CSW consulted for SNF placement. PN reviewed. PT notes that pt plans to return home. RNCM will assist with [facility], if needed.  Cori Razor LCSW [phone]

## 2012-07-20 LAB — GLUCOSE, CAPILLARY
Glucose-Capillary: 128 mg/dL — ABNORMAL HIGH (ref 70–99)
Glucose-Capillary: 108 mg/dL — ABNORMAL HIGH (ref 70–99)

## 2012-07-20 NOTE — Progress Notes (Signed)
Patient stated he did not have PT today, but did walk in the hall way with NT. RN read previous PT/OT notes, both of which have signed off from patient. He states that he knows his body and the ways he needs to move, and PT can offer him nothing he doesn't already know. However, he was asking why they did not attempt a session today. MD, Please follow up with patient regarding PT/OT status. Thanks. Mindie Rawdon [place], [place] 07/20/2012 9:39 PM

## 2012-07-20 NOTE — Plan of Care (Signed)
Problem: Phase II Progression Outcomes Goal: Return of bowel function (flatus, BM) IF ABDOMINAL SURGERY:  Outcome: Completed/Met Date Met:  07/20/12 Spinal surgery Goal: Foley discontinued Outcome: Not Applicable Date Met:  07/20/12 No foley

## 2012-07-20 NOTE — Progress Notes (Signed)
Subjective: 3 Days Post-Op Procedure(s) (LRB): DECOMPRESSIVE LUMBAR LAMINECTOMY LEVEL 3 (N/A) Patient reports pain as mild.   DC PCA. Walking well.  Plan for home Monday. Seen in rounds with Dr. Lequita Halt.  Objective: Vital signs in last 24 hours: Temp:  [98.6 F (37 C)-99.6 F (37.6 C)] 98.6 F (37 C) (12/07 0526) Pulse Rate:  [65-71] 68  (12/07 0526) Resp:  [14-16] 14  (12/07 0526) BP: (141-148)/(7-71) 145/66 mmHg (12/07 0526) SpO2:  [93 %-100 %] 99 % (12/07 0526)  Intake/Output from previous day: 12/06 0701 - 12/07 0700 In: 720 [P.O.:240; I.V.:480] Out: 650 [Urine:650] Intake/Output this shift:     Basename 07/19/12 0433 07/18/12 0829 07/17/12 1816 07/17/12 1509  HGB 9.6* 10.1* 11.8* 13.3    Basename 07/19/12 0433 07/18/12 0829  WBC -- --  RBC -- --  HCT 28.2* 30.0*  PLT -- --    Basename 07/17/12 1509  NA 140  K 3.6  CL --  CO2 --  BUN --  CREATININE --  GLUCOSE 123*  CALCIUM --   No results found for this basename: LABPT:2,INR:2 in the last 72 hours  Neurovascular intact Sensation intact distally Incision: dressing C/D/I and incision looks good.  Assessment/Plan: 3 Days Post-Op Procedure(s) (LRB): DECOMPRESSIVE LUMBAR LAMINECTOMY LEVEL 3 (N/A) Up with therapy Plan for Monday. DC PCA   PERKINS, ALEXZANDREW 07/20/2012, 7:22 AM

## 2012-07-21 LAB — GLUCOSE, CAPILLARY: Glucose-Capillary: 200 mg/dL — ABNORMAL HIGH (ref 70–99)

## 2012-07-21 NOTE — Progress Notes (Signed)
Patient reports not being able to urinate, and having pain and frequency with voids. Voiding very small amounts, frequently. Output sufficient, intake reasonable. [name] [place], [place] 07/21/2012 6:27 AM

## 2012-07-21 NOTE — Progress Notes (Signed)
   Subjective: 4 Days Post-Op Procedure(s) (LRB): DECOMPRESSIVE LUMBAR LAMINECTOMY LEVEL 3 (N/A) Patient reports pain as mild.   Patient seen in rounds with Dr. Darrelyn Hillock. Patient is well, but has had some minor complaints of urinary rentention. He is voiding on his own but small amounts. No issues with shortness of breath or chest pain. He reports no leg pain with minimal back pain at this time.   Plan is to go Home after hospital stay.  Objective: Vital signs in last 24 hours: Temp:  [97.8 F (36.6 C)-98.6 F (37 C)] 97.8 F (36.6 C) (12/08 0547) Pulse Rate:  [58-66] 66  (12/08 0547) Resp:  [15-20] 20  (12/08 0547) BP: (123-175)/(66-74) 174/70 mmHg (12/08 0547) SpO2:  [97 %-100 %] 99 % (12/08 0547)  Intake/Output from previous day:  Intake/Output Summary (Last 24 hours) at 07/21/12 0840 Last data filed at 07/21/12 0546  Gross per 24 hour  Intake    960 ml  Output   1300 ml  Net   -340 ml     Labs:  Basename 07/19/12 0433  HGB 9.6*    Basename 07/19/12 0433  WBC --  RBC --  HCT 28.2*  PLT --   EXAM General - Patient is Alert and Oriented Extremity - Neurologically intact Neurovascular intact Dorsiflexion/Plantar flexion intact Dressing/Incision - C/D/I Motor Function - intact, moving foot and toes well on exam.   Past Medical History  Diagnosis Date  . Hypertension   . Polymyalgia rheumatica   . Fatigue   . Aortic valve disease   . Coronary artery disease     cardiac catheterization 2005 / EF 50%  . Diabetes mellitus   . Hyperlipidemia   . PVC's (premature ventricular contractions)   . Sciatica   . Heart murmur   . DDD (degenerative disc disease)     rheumatoid arthritis   . Anemia     Assessment/Plan: 4 Days Post-Op Procedure(s) (LRB): DECOMPRESSIVE LUMBAR LAMINECTOMY LEVEL 3 (N/A) Active Problems:  * No active hospital problems. *   Estimated Body mass index is 25.90 kg/(m^2) as calculated from the following:   Height as of this encounter:  6\' 3" (1.905 m).   Weight as of this encounter: 207 lb 3.7 oz(94 kg). Advance diet Up with therapy Plan for discharge tomorrow Weight-Bearing as tolerated  Will continue to monitor his ability to void  Keirra Zeimet LAUREN 07/21/2012, 8:40 AM

## 2012-07-22 MED ORDER — HYDROCODONE-ACETAMINOPHEN 10-325 MG PO TABS: 1.0000 | ORAL_TABLET | ORAL | Status: DC | PRN

## 2012-07-22 MED ORDER — METHOCARBAMOL 500 MG PO TABS: 500.0000 mg | ORAL_TABLET | Freq: Four times a day (QID) | ORAL | Status: DC | PRN

## 2012-07-22 NOTE — Care Management Note (Signed)
    Page 1 of 2   07/22/2012     1:14:23 PM   CARE MANAGEMENT NOTE 07/22/2012  Patient:  BACH, ROCCHI   Account Number:  [id number]  Date Initiated:  07/19/2012  Documentation initiated by:  Colleen Can  Subjective/Objective Assessment:   DX  RECURRENT SPINAL STENOSIS; REPEAT DECOMPRESSIVE LAMINECTOMY     Action/Plan:   PLANS ARE FOR PATIENT TO RETURN TO HIS HOME WHERE SPOUSE WILL BE CAREGIVER. PT ALREADY HAS DME WHICH INCLUDES RW. THERE ARE NO HH SERVICE NEEDS   Anticipated DC Date:  07/22/2012   Anticipated DC Plan:  HOME/SELF CARE  In-house referral  Clinical Social Worker      DC Planning Services  CM consult      [facility] Choice  NA   Choice offered to / List presented to:  NA   DME arranged  NA      DME agency  NA     HH arranged  NA      HH agency  NA   Status of service:  Completed, signed off Medicare Important Message given?   (If response is "NO", the following Medicare IM given date fields will be blank) Date Medicare IM given:   Date Additional Medicare IM given:    Discharge Disposition:  HOME/SELF CARE  Per UR Regulation:    If discussed at Long Length of Stay Meetings, dates discussed:    Comments:

## 2012-07-22 NOTE — Progress Notes (Signed)
Patient ID: [name], male   DOB: [birth year], 76 y.o.   MRN: [id number] Afebrile.   Ambulatory.  Pain now manageable on oral meds.  Incision clean and dry.  Plan discharge on robaxin and norco.  Inst given.  Office 2 wks post-op.

## 2012-07-22 NOTE — Discharge Summary (Signed)
[name], [name]                 ACCOUNT NO.:  [id number]  MEDICAL RECORD NO.:  [id number]  LOCATION:  1617                         FACILITY:  [facility]  PHYSICIAN:  Marlowe Kays, M.D.  DATE OF BIRTH:  1936/04/24  DATE OF ADMISSION:  07/17/2012 DATE OF DISCHARGE:  07/22/2012                              DISCHARGE SUMMARY   ADMITTING DIAGNOSIS:  Recurrent spinal stenosis L3-4, L4-5, and L5-S1.  DISCHARGE DIAGNOSIS:  Recurrent spinal stenosis L3-4, L4-5, and L5-S1.  OPERATION:  Repeat decompressive laminectomy L3-4, L4-5, and L5-S1 on July 17, 2012.  SUMMARY:  [name] is a longtime patient of mine and roughly 4 years ago, I performed a decompressive laminectomy from L2 to the sacrum.  He did well for about 2 years and since that time as noted progressive pain in the neck going down into both legs.  He also has a number of other associated medical issues including rheumatoid arthritis, and non- insulin-dependent diabetes.  He has been on steroids and chronic pain treatment including fentanyl patch.  His pain is gotten place which has been unmanageable for him.  Accordingly, he had an MRI which demonstrated the admission diagnoses.  He was unable to have an myelogram because of reaction to previous myelogram before his 1st surgery.  The surgery itself went uneventfully with no complications. Postoperatively, we monitored his hemoglobin, it dropped slightly below 10, but was stable at that point and he had no symptoms of anemia and did not need a blood transfusion.  He ran fever up to 101+ several nights, but he is not afebrile.  He is ambulatory.  His admission was longer than I anticipated because of pain requiring IV narcotics but at this time, he is comfortable on p.o. analgesics.  He is given discharge instructions his wound was redressed this morning and is clean and dry. Plan is to have him return to my office in 2 weeks from surgery.  He is given prescriptions for  Robaxin 500, Norco 10/325 and continue with fentanyl patches which he has at home as well as has extensive home medicines.  CONDITION AT DISCHARGE:  Stable and improved.  His decrease in hemoglobin post operatively was as a result of his surgery        ______________________________ Marlowe Kays, M.D.     JA/MEDQ  D:  07/22/2012  T:  07/22/2012  Job:  [id number]

## 2012-07-26 NOTE — Discharge Summary (Signed)
NAMEIREOLUWA, GRANT NO.:  [id number]  MEDICAL RECORD NO.:  [id number]  LOCATION:  1617                         FACILITY:  [facility]  PHYSICIAN:  Marlowe Kays, M.D.  DATE OF BIRTH:  08-20-1935  DATE OF ADMISSION:  07/17/2012 DATE OF DISCHARGE:  07/22/2012                              DISCHARGE SUMMARY   ADDENDUM  His postoperative anemia was secondary to surgery and no abnormal blood loss.          ______________________________ Marlowe Kays, M.D.     JA/MEDQ  D:  07/25/2012  T:  07/26/2012  Job:  [id number]

## 2012-08-22 ENCOUNTER — Other Ambulatory Visit: Payer: Self-pay | Admitting: Cardiology

## 2012-08-22 MED ORDER — METFORMIN HCL 500 MG PO TABS: 1000.0000 mg | ORAL_TABLET | Freq: Every day | ORAL | Status: DC

## 2012-09-19 ENCOUNTER — Other Ambulatory Visit: Payer: Self-pay | Admitting: Dermatology

## 2012-09-28 ENCOUNTER — Other Ambulatory Visit: Payer: Self-pay

## 2012-10-03 ENCOUNTER — Other Ambulatory Visit: Payer: Self-pay | Admitting: Orthopedic Surgery

## 2012-10-08 ENCOUNTER — Other Ambulatory Visit (INDEPENDENT_AMBULATORY_CARE_PROVIDER_SITE_OTHER): Payer: Medicare Other

## 2012-10-08 DIAGNOSIS — E785 Hyperlipidemia, unspecified: Secondary | ICD-10-CM

## 2012-10-08 DIAGNOSIS — I119 Hypertensive heart disease without heart failure: Secondary | ICD-10-CM

## 2012-10-08 LAB — BASIC METABOLIC PANEL
BUN: 17 mg/dL (ref 6–23)
Calcium: 9 mg/dL (ref 8.4–10.5)
GFR: 100.3 mL/min (ref >60.00)
Potassium: 4.1 mEq/L (ref 3.5–5.1)
Sodium: 140 mEq/L (ref 135–145)

## 2012-10-08 LAB — CBC WITH DIFFERENTIAL/PLATELET
Eosinophils Absolute: 0.5 10*3/uL (ref 0.0–0.7)
Lymphs Abs: 1.3 10*3/uL (ref 0.7–4.0)
MCHC: 33.5 g/dL (ref 30.0–36.0)
MCV: 85.3 fl (ref 78.0–100.0)
Monocytes Absolute: 0.7 10*3/uL (ref 0.1–1.0)
Neutrophils Relative %: 58.8 % (ref 43.0–77.0)
Platelets: 240 10*3/uL (ref 150.0–400.0)

## 2012-10-08 LAB — LIPID PANEL
Cholesterol: 178 mg/dL (ref 0–200)
HDL: 47.3 mg/dL (ref >39.00)
LDL Cholesterol: 100 mg/dL — ABNORMAL HIGH (ref 0–99)
VLDL: 30.6 mg/dL (ref 0.0–40.0)

## 2012-10-08 LAB — HEPATIC FUNCTION PANEL
AST: 19 U/L (ref 0–37)
Alkaline Phosphatase: 55 U/L (ref 39–117)
Total Bilirubin: 0.8 mg/dL (ref 0.3–1.2)

## 2012-10-09 NOTE — Progress Notes (Signed)
Quick Note:  Please make copy of labs for patient visit. ______ 

## 2012-10-10 ENCOUNTER — Encounter: Payer: Self-pay | Admitting: Cardiology

## 2012-10-10 ENCOUNTER — Ambulatory Visit (INDEPENDENT_AMBULATORY_CARE_PROVIDER_SITE_OTHER): Payer: Medicare Other | Admitting: Cardiology

## 2012-10-10 VITALS — BP 160/76 | HR 72 | Ht 75.0 in | Wt 206.0 lb

## 2012-10-10 DIAGNOSIS — IMO0001 Reserved for inherently not codable concepts without codable children: Secondary | ICD-10-CM

## 2012-10-10 DIAGNOSIS — E785 Hyperlipidemia, unspecified: Secondary | ICD-10-CM

## 2012-10-10 DIAGNOSIS — I359 Nonrheumatic aortic valve disorder, unspecified: Secondary | ICD-10-CM

## 2012-10-10 DIAGNOSIS — I358 Other nonrheumatic aortic valve disorders: Secondary | ICD-10-CM

## 2012-10-10 DIAGNOSIS — I119 Hypertensive heart disease without heart failure: Secondary | ICD-10-CM

## 2012-10-10 NOTE — Assessment & Plan Note (Signed)
The patient has not been having any symptoms of congestive heart failure.  No dizziness or syncope. 

## 2012-10-10 NOTE — Progress Notes (Signed)
[name] Date of Birth:  [birth year] [facility] [address] [place], [place]  [address] [phone]         Fax   [phone]  History of Present Illness: This pleasant 77 year old gentleman is seen for a four-month followup office visit. He has a past history of essential hypertension, symptomatic PVCs, a past history of polymyalgia rheumatica, and a present history of severe rheumatoid arthritis. He is followed closely by his rheumatologist Dr. Dareen Piano.  The patient does not have any history of ischemic heart disease.  He has a known systolic heart murmur and his last echocardiogram 06/13/12 showed normal left ventricular systolic function and showed aortic valve sclerosis without stenosis.   Current Outpatient Prescriptions  Medication Sig Dispense Refill  . acetaminophen (TYLENOL) 500 MG tablet Take 500 mg by mouth every 6 (six) hours as needed for pain.      Marland Kitchen amLODipine (NORVASC) 10 MG tablet Take 10 mg by mouth every morning.      Marland Kitchen aspirin EC 81 MG tablet Take 81 mg by mouth at bedtime.      . B Complex-C (B-COMPLEX WITH VITAMIN C) tablet Take 1 tablet by mouth 2 (two) times daily.      . Calcium Citrate-Vitamin D (CITRACAL + D PO) Take 1 tablet by mouth 2 (two) times daily.      . cholecalciferol (VITAMIN D) 1000 UNITS tablet Take 2,000 Units by mouth 2 (two) times daily.      . DULoxetine (CYMBALTA) 60 MG capsule Take 60 mg by mouth at bedtime.       . fentaNYL (DURAGESIC - DOSED MCG/HR) 25 MCG/HR Place 1 patch onto the skin every other day.       . ferrous sulfate 325 (65 FE) MG tablet Take 325 mg by mouth 2 (two) times daily.      . Garlic 1000 MG CAPS Take 1,000 mg by mouth 2 (two) times daily.      Marland Kitchen glucosamine-chondroitin (COSAMIN DS) 500-400 MG tablet Take 1 tablet by mouth 2 (two) times daily.      . Golimumab (SIMPONI) 100 MG/ML SOLN Inject 100 mg into the skin every 30 (thirty) days. Last dose per pt was 06/10/12      .  HYDROcodone-acetaminophen (NORCO/VICODIN) 5-325 MG per tablet Take 1 tablet by mouth every 6 (six) hours as needed for pain.      . Lancets (ONETOUCH ULTRASOFT) lancets TEST BLOOD SUGAR AS DIRECTED  100 each  11  . leflunomide (ARAVA) 20 MG tablet Take 20 mg by mouth every morning.       Marland Kitchen lisinopril (PRINIVIL,ZESTRIL) 20 MG tablet Take 20 mg by mouth every morning.      . metFORMIN (GLUCOPHAGE) 500 MG tablet Take 2 tablets (1,000 mg total) by mouth daily with breakfast.  60 tablet  4  . Methylsulfonylmethane (MSM) 1000 MG CAPS Take 1,000 mg by mouth daily.      . Multiple Vitamin (MULTIVITAMIN WITH MINERALS) TABS Take 1 tablet by mouth 2 (two) times daily.      Marland Kitchen NASONEX 50 MCG/ACT nasal spray Place 1 spray into the nose daily as needed. Allergies      . Omega-3 Fatty Acids (FISH OIL) 1200 MG CAPS Take 1,200 mg by mouth 2 (two) times daily.      . ONE TOUCH ULTRA TEST test strip TEST BLOOD SUGAR AS DIRECTED  100 each  0  . oxymetazoline (AFRIN) 0.05 % nasal spray Place 2 sprays  into the nose daily. As needed      . predniSONE (DELTASONE) 5 MG tablet Take 5 mg by mouth daily.       . temazepam (RESTORIL) 30 MG capsule Take 30 mg by mouth at bedtime.      Marland Kitchen tiZANidine (ZANAFLEX) 4 MG capsule Take 4 mg by mouth 3 (three) times daily.      . traMADol (ULTRAM) 50 MG tablet Take 50 mg by mouth every 6 (six) hours as needed for pain.      Marland Kitchen triamcinolone cream (KENALOG) 0.1 % Apply 1 application topically 2 (two) times daily as needed. Rash       No current facility-administered medications for this visit.    Allergies  Allergen Reactions  . Sulfa Drugs Cross Reactors Other (See Comments)    Unknown     Patient Active Problem List  Diagnosis  . Polymyalgia rheumatica  . Benign hypertensive heart disease without heart failure  . Dyslipidemia  . Diabetes mellitus  . Rheumatoid arthritis    History  Smoking status  . Never Smoker   Smokeless tobacco  . Never Used    History    Alcohol Use No    Family History  Problem Relation Age of Onset  . Heart disease Father     Review of Systems: Constitutional: no fever chills diaphoresis or fatigue or change in weight.  Head and neck: no hearing loss, no epistaxis, no photophobia or visual disturbance. Respiratory: No cough, shortness of breath or wheezing. Cardiovascular: No chest pain peripheral edema, palpitations. Gastrointestinal: No abdominal distention, no abdominal pain, no change in bowel habits hematochezia or melena. Genitourinary: No dysuria, no frequency, no urgency, no nocturia. Musculoskeletal:No arthralgias, no back pain, no gait disturbance or myalgias. Neurological: No dizziness, no headaches, no numbness, no seizures, no syncope, no weakness, no tremors. Hematologic: No lymphadenopathy, no easy bruising. Psychiatric: No confusion, no hallucinations, no sleep disturbance.    Physical Exam: Filed Vitals:   10/10/12 1200  BP: 160/76  Pulse: 72   the general appearance reveals a well-developed middle-aged gentleman in no distress.  I rechecked his blood pressure and it is down to 146/76.The head and neck exam reveals pupils equal and reactive.  Extraocular movements are full.  There is no scleral icterus.  The mouth and pharynx are normal.  The neck is supple.  The carotids reveal no bruits.  The jugular venous pressure is normal.  The  thyroid is not enlarged.  There is no lymphadenopathy.  The chest is clear to percussion and auscultation.  There are no rales or rhonchi.  Expansion of the chest is symmetrical.  The precordium is quiet.  The first heart sound is normal.  The second heart sound is physiologically split.  There is a grade 2/6 systolic ejection murmur at the aortic area.  No diastolic murmur.  No gallop or rub.  There is no abnormal lift or heave.  The abdomen is soft and nontender.  The bowel sounds are normal.  The liver and spleen are not enlarged.  There are no abdominal masses.  There  are no abdominal bruits.  Extremities reveal good pedal pulses.  There is no phlebitis or edema.  There is no cyanosis or clubbing.  Strength is normal and symmetrical in all extremities.  There is no lateralizing weakness.  There are no sensory deficits.  The skin is warm and dry.  There is no rash.     Assessment / Plan: The patient is to  continue same medication.  He will work harder on weight loss and careful diet to lower his triglycerides and blood sugar further.  Recheck in 4 months for office visit lipid panel hepatic function panel and basal metabolic panel.

## 2012-10-10 NOTE — Patient Instructions (Addendum)
Your physician recommends that you continue on your current medications as directed. Please refer to the Current Medication list given to you today.  Your physician recommends that you schedule a follow-up appointment in: 4 months with fasting labs (lp/bmet/hfp)  

## 2012-10-10 NOTE — Assessment & Plan Note (Signed)
The patient continues to have hyper cholesterolemia and dyslipidemia.  History glycerides are still above-described are improving.  Because of his diabetes his LDL goal is 70.

## 2012-10-10 NOTE — Assessment & Plan Note (Signed)
The patient has not been having any hypoglycemic episodes from his mild diabetes mellitus.  He has not been able to get much exercise because of his orthopedic issues.  His blood sugar remains above goal.  The patient will be having her stored of foot surgery with removal of 2 titanium screws from his ankle next week and after that he hopes to be able to get back into a walking regimen

## 2012-10-11 ENCOUNTER — Encounter (HOSPITAL_BASED_OUTPATIENT_CLINIC_OR_DEPARTMENT_OTHER): Payer: Self-pay | Admitting: *Deleted

## 2012-10-11 NOTE — Progress Notes (Signed)
NPO AFTER MN. ARRIVES AT 1100. NEEDS ISTAT. CURRENT EKG IN EPIC AND CHART. WILL TAKE PREDNISONE AND NORVASC AM OF SURG W/ SIP OF WATER.

## 2012-10-15 ENCOUNTER — Telehealth: Payer: Self-pay | Admitting: Cardiology

## 2012-10-15 NOTE — Telephone Encounter (Signed)
Pt needs auth for temazepam 30mg  with bcbs  [phone]

## 2012-10-15 NOTE — Telephone Encounter (Signed)
Will forward to Melinda Pratt. 

## 2012-10-17 ENCOUNTER — Encounter (HOSPITAL_BASED_OUTPATIENT_CLINIC_OR_DEPARTMENT_OTHER)
Admission: RE | Disposition: A | Discharge status: Home / Self Care | Payer: Self-pay | Source: Ambulatory Visit | Attending: Orthopedic Surgery

## 2012-10-17 ENCOUNTER — Ambulatory Visit (HOSPITAL_BASED_OUTPATIENT_CLINIC_OR_DEPARTMENT_OTHER): Payer: Medicare Other | Admitting: Anesthesiology

## 2012-10-17 ENCOUNTER — Encounter (HOSPITAL_BASED_OUTPATIENT_CLINIC_OR_DEPARTMENT_OTHER): Payer: Self-pay | Admitting: Anesthesiology

## 2012-10-17 ENCOUNTER — Ambulatory Visit (HOSPITAL_BASED_OUTPATIENT_CLINIC_OR_DEPARTMENT_OTHER)
Admission: RE | Admit: 2012-10-17 | Discharge: 2012-10-17 | Disposition: A | Discharge status: Home / Self Care | Payer: Medicare Other | Source: Ambulatory Visit | Attending: Orthopedic Surgery | Admitting: Orthopedic Surgery

## 2012-10-17 DIAGNOSIS — I2510 Atherosclerotic heart disease of native coronary artery without angina pectoris: Secondary | ICD-10-CM | POA: Insufficient documentation

## 2012-10-17 DIAGNOSIS — E785 Hyperlipidemia, unspecified: Secondary | ICD-10-CM | POA: Insufficient documentation

## 2012-10-17 DIAGNOSIS — I359 Nonrheumatic aortic valve disorder, unspecified: Secondary | ICD-10-CM | POA: Insufficient documentation

## 2012-10-17 DIAGNOSIS — I4949 Other premature depolarization: Secondary | ICD-10-CM | POA: Insufficient documentation

## 2012-10-17 DIAGNOSIS — Z79899 Other long term (current) drug therapy: Secondary | ICD-10-CM | POA: Insufficient documentation

## 2012-10-17 DIAGNOSIS — M069 Rheumatoid arthritis, unspecified: Secondary | ICD-10-CM | POA: Insufficient documentation

## 2012-10-17 DIAGNOSIS — I10 Essential (primary) hypertension: Secondary | ICD-10-CM | POA: Insufficient documentation

## 2012-10-17 DIAGNOSIS — M25571 Pain in right ankle and joints of right foot: Secondary | ICD-10-CM

## 2012-10-17 DIAGNOSIS — Z472 Encounter for removal of internal fixation device: Secondary | ICD-10-CM | POA: Insufficient documentation

## 2012-10-17 DIAGNOSIS — E119 Type 2 diabetes mellitus without complications: Secondary | ICD-10-CM | POA: Insufficient documentation

## 2012-10-17 HISTORY — DX: Low back pain, unspecified: M54.50

## 2012-10-17 HISTORY — DX: Other chronic pain: G89.29

## 2012-10-17 HISTORY — DX: Cardiac murmur, unspecified: R01.1

## 2012-10-17 HISTORY — DX: Type 2 diabetes mellitus without complications: E11.9

## 2012-10-17 HISTORY — DX: Rheumatoid arthritis, unspecified: M06.9

## 2012-10-17 HISTORY — DX: Low back pain: M54.5

## 2012-10-17 HISTORY — PX: HARDWARE REMOVAL: SHX979

## 2012-10-17 LAB — POCT I-STAT 4, (NA,K, GLUC, HGB,HCT)
Glucose, Bld: 121 mg/dL — ABNORMAL HIGH (ref 70–99)
HCT: 46 % (ref 39.0–52.0)
Hemoglobin: 15.6 g/dL (ref 13.0–17.0)
Potassium: 3.9 mEq/L (ref 3.5–5.1)
Sodium: 141 mEq/L (ref 135–145)

## 2012-10-17 SURGERY — REMOVAL, HARDWARE
Anesthesia: General | Site: Ankle | Laterality: Right | Wound class: Clean

## 2012-10-17 MED ORDER — 0.9 % SODIUM CHLORIDE (POUR BTL) OPTIME
TOPICAL | Status: DC | PRN
  Administered 2012-10-17: 500 mL

## 2012-10-17 MED ORDER — FENTANYL CITRATE 0.05 MG/ML IJ SOLN
INTRAMUSCULAR | Status: DC | PRN
  Administered 2012-10-17 (×2): 25 ug via INTRAVENOUS
  Administered 2012-10-17: 50 ug via INTRAVENOUS

## 2012-10-17 MED ORDER — MIDAZOLAM HCL 5 MG/5ML IJ SOLN
INTRAMUSCULAR | Status: DC | PRN
  Administered 2012-10-17: 0.5 mg via INTRAVENOUS

## 2012-10-17 MED ORDER — MEPERIDINE HCL 25 MG/ML IJ SOLN
6.2500 mg | INTRAMUSCULAR | Status: DC | PRN
  Filled 2012-10-17: qty 1

## 2012-10-17 MED ORDER — ACETAMINOPHEN 10 MG/ML IV SOLN
INTRAVENOUS | Status: DC | PRN
  Administered 2012-10-17: 1000 mg via INTRAVENOUS

## 2012-10-17 MED ORDER — FENTANYL CITRATE 0.05 MG/ML IJ SOLN
50.0000 ug | INTRAMUSCULAR | Status: DC | PRN
  Administered 2012-10-17: 50 ug via INTRAVENOUS
  Administered 2012-10-17: 25 ug via INTRAVENOUS
  Filled 2012-10-17: qty 2

## 2012-10-17 MED ORDER — PROMETHAZINE HCL 25 MG/ML IJ SOLN
6.2500 mg | INTRAMUSCULAR | Status: DC | PRN
  Filled 2012-10-17: qty 1

## 2012-10-17 MED ORDER — DEXAMETHASONE SODIUM PHOSPHATE 4 MG/ML IJ SOLN
INTRAMUSCULAR | Status: DC | PRN
  Administered 2012-10-17: 8 mg via INTRAVENOUS

## 2012-10-17 MED ORDER — BUPIVACAINE-EPINEPHRINE 0.5% -1:200000 IJ SOLN
INTRAMUSCULAR | Status: DC | PRN
  Administered 2012-10-17: 10 mL

## 2012-10-17 MED ORDER — EPHEDRINE SULFATE 50 MG/ML IJ SOLN
INTRAMUSCULAR | Status: DC | PRN
  Administered 2012-10-17: 5 mg via INTRAVENOUS
  Administered 2012-10-17 (×2): 10 mg via INTRAVENOUS
  Administered 2012-10-17: 5 mg via INTRAVENOUS
  Administered 2012-10-17 (×2): 10 mg via INTRAVENOUS

## 2012-10-17 MED ORDER — ONDANSETRON HCL 4 MG/2ML IJ SOLN
INTRAMUSCULAR | Status: DC | PRN
  Administered 2012-10-17: 4 mg via INTRAVENOUS

## 2012-10-17 MED ORDER — LACTATED RINGERS IV SOLN
INTRAVENOUS | Status: DC
  Administered 2012-10-17: 11:00:00 via INTRAVENOUS
  Administered 2012-10-17: 100 mL via INTRAVENOUS
  Administered 2012-10-17: 14:00:00 via INTRAVENOUS
  Filled 2012-10-17: qty 1000

## 2012-10-17 MED ORDER — LIDOCAINE HCL (CARDIAC) 20 MG/ML IV SOLN
INTRAVENOUS | Status: DC | PRN
  Administered 2012-10-17: 75 mg via INTRAVENOUS

## 2012-10-17 MED ORDER — LACTATED RINGERS IV SOLN
INTRAVENOUS | Status: DC
  Filled 2012-10-17: qty 1000

## 2012-10-17 MED ORDER — PROPOFOL 10 MG/ML IV BOLUS
INTRAVENOUS | Status: DC | PRN
  Administered 2012-10-17: 300 mg via INTRAVENOUS

## 2012-10-17 MED ORDER — POVIDONE-IODINE 7.5 % EX SOLN
Freq: Once | CUTANEOUS | Status: DC
  Filled 2012-10-17: qty 118

## 2012-10-17 MED ORDER — TRAMADOL HCL 50 MG PO TABS
50.0000 mg | ORAL_TABLET | Freq: Four times a day (QID) | ORAL | Status: AC | PRN
  Administered 2012-10-17: 50 mg via ORAL
  Filled 2012-10-17: qty 1

## 2012-10-17 SURGICAL SUPPLY — 67 items
APL SKNCLS STERI-STRIP NONHPOA (GAUZE/BANDAGES/DRESSINGS)
BANDAGE CONFORM 3  STR LF (GAUZE/BANDAGES/DRESSINGS) ×1 IMPLANT
BANDAGE ELASTIC 3 VELCRO ST LF (GAUZE/BANDAGES/DRESSINGS) IMPLANT
BANDAGE ELASTIC 4 VELCRO ST LF (GAUZE/BANDAGES/DRESSINGS) IMPLANT
BANDAGE ELASTIC 6 VELCRO ST LF (GAUZE/BANDAGES/DRESSINGS) ×2 IMPLANT
BANDAGE ESMARK 6X9 LF (GAUZE/BANDAGES/DRESSINGS) IMPLANT
BANDAGE GAUZE ELAST BULKY 4 IN (GAUZE/BANDAGES/DRESSINGS) IMPLANT
BENZOIN TINCTURE PRP APPL 2/3 (GAUZE/BANDAGES/DRESSINGS) IMPLANT
BLADE SURG 15 STRL LF DISP TIS (BLADE) ×2 IMPLANT
BLADE SURG 15 STRL SS (BLADE) ×4
BNDG CMPR 9X4 STRL LF SNTH (GAUZE/BANDAGES/DRESSINGS)
BNDG CMPR 9X6 STRL LF SNTH (GAUZE/BANDAGES/DRESSINGS) ×1
BNDG ESMARK 4X9 LF (GAUZE/BANDAGES/DRESSINGS) IMPLANT
BNDG ESMARK 6X9 LF (GAUZE/BANDAGES/DRESSINGS) ×2
CANISTER SUCTION 1200CC (MISCELLANEOUS) IMPLANT
CANISTER SUCTION 2500CC (MISCELLANEOUS) IMPLANT
CLOTH BEACON ORANGE TIMEOUT ST (SAFETY) ×2 IMPLANT
COVER TABLE BACK 60X90 (DRAPES) ×2 IMPLANT
DRAPE C-ARM 42X72 X-RAY (DRAPES) IMPLANT
DRAPE C-ARM MINI 42X72 WSTRAPS (DRAPES) ×1 IMPLANT
DRAPE EXTREMITY T 121X128X90 (DRAPE) ×2 IMPLANT
DRAPE LG THREE QUARTER DISP (DRAPES) ×4 IMPLANT
DRAPE U-SHAPE 47X51 STRL (DRAPES) ×2 IMPLANT
DRSG EMULSION OIL 3X3 NADH (GAUZE/BANDAGES/DRESSINGS) ×2 IMPLANT
DURAPREP 26ML APPLICATOR (WOUND CARE) ×2 IMPLANT
ELECT NDL TIP 2.8 STRL (NEEDLE) IMPLANT
ELECT NEEDLE TIP 2.8 STRL (NEEDLE) IMPLANT
ELECT REM PT RETURN 9FT ADLT (ELECTROSURGICAL) ×2
ELECTRODE REM PT RTRN 9FT ADLT (ELECTROSURGICAL) ×1 IMPLANT
GLOVE BIOGEL PI IND STRL 8 (GLOVE) ×1 IMPLANT
GLOVE BIOGEL PI INDICATOR 8 (GLOVE) ×1
GLOVE ECLIPSE 8.0 STRL XLNG CF (GLOVE) ×6 IMPLANT
GLOVE INDICATOR 8.0 STRL GRN (GLOVE) ×3 IMPLANT
GOWN W/2 COTTON TOWELS 2 STD (GOWNS) ×2 IMPLANT
GOWN XL W/COTTON TOWEL STD (GOWNS) ×2 IMPLANT
NDL HYPO 25X1 1.5 SAFETY (NEEDLE) IMPLANT
NEEDLE HYPO 22GX1.5 SAFETY (NEEDLE) ×1 IMPLANT
NEEDLE HYPO 25X1 1.5 SAFETY (NEEDLE) IMPLANT
NS IRRIG 500ML POUR BTL (IV SOLUTION) ×2 IMPLANT
PACK BASIN DAY SURGERY FS (CUSTOM PROCEDURE TRAY) ×2 IMPLANT
PAD CAST 3X4 CTTN HI CHSV (CAST SUPPLIES) IMPLANT
PADDING CAST ABS 4INX4YD NS (CAST SUPPLIES) ×1
PADDING CAST ABS COTTON 4X4 ST (CAST SUPPLIES) ×1 IMPLANT
PADDING CAST COTTON 3X4 STRL (CAST SUPPLIES)
PADDING CAST COTTON 6X4 STRL (CAST SUPPLIES) IMPLANT
PENCIL BUTTON HOLSTER BLD 10FT (ELECTRODE) ×1 IMPLANT
SPONGE GAUZE 4X4 12PLY (GAUZE/BANDAGES/DRESSINGS) IMPLANT
STOCKINETTE 4X48 STRL (DRAPES) IMPLANT
STOCKINETTE 6  STRL (DRAPES) ×1
STOCKINETTE 6 STRL (DRAPES) IMPLANT
STRIP CLOSURE SKIN 1/2X4 (GAUZE/BANDAGES/DRESSINGS) IMPLANT
SUCTION FRAZIER TIP 10 FR DISP (SUCTIONS) ×1 IMPLANT
SUT ETHILON 4 0 PS 2 18 (SUTURE) ×2 IMPLANT
SUT MON AB 4-0 PC3 18 (SUTURE) IMPLANT
SUT VIC AB 2-0 SH 27 (SUTURE) ×2
SUT VIC AB 2-0 SH 27XBRD (SUTURE) IMPLANT
SUT VIC AB 2-0 UR6 27 (SUTURE) IMPLANT
SUT VIC AB 3-0 FS2 27 (SUTURE) IMPLANT
SUT VIC AB 4-0 SH 27 (SUTURE)
SUT VIC AB 4-0 SH 27XANBCTRL (SUTURE) IMPLANT
SYR BULB IRRIGATION 50ML (SYRINGE) ×1 IMPLANT
SYR CONTROL 10ML LL (SYRINGE) ×1 IMPLANT
TOWEL OR 17X24 6PK STRL BLUE (TOWEL DISPOSABLE) ×4 IMPLANT
TUBE CONNECTING 12X1/4 (SUCTIONS) ×1 IMPLANT
UNDERPAD 30X30 INCONTINENT (UNDERPADS AND DIAPERS) ×2 IMPLANT
WATER STERILE IRR 500ML POUR (IV SOLUTION) ×2 IMPLANT
YANKAUER SUCT BULB TIP NO VENT (SUCTIONS) IMPLANT

## 2012-10-17 NOTE — Anesthesia Procedure Notes (Signed)
Procedure Name: LMA Insertion Date/Time: 10/17/2012 1:10 PM Performed by: Fran Lowes Pre-anesthesia Checklist: Patient identified, Emergency Drugs available, Suction available and Patient being monitored Patient Re-evaluated:Patient Re-evaluated prior to inductionOxygen Delivery Method: Circle System Utilized Preoxygenation: Pre-oxygenation with 100% oxygen Intubation Type: IV induction Ventilation: Mask ventilation without difficulty LMA: LMA inserted LMA Size: 4.0 Number of attempts: 1 Airway Equipment and Method: bite block Placement Confirmation: positive ETCO2 Tube secured with: Tape Dental Injury: Teeth and Oropharynx as per pre-operative assessment

## 2012-10-17 NOTE — Anesthesia Preprocedure Evaluation (Signed)
Anesthesia Evaluation  Patient identified by MRN, date of birth, ID band Patient awake    Reviewed: Allergy & Precautions, H&P , NPO status , Patient's Chart, lab work & pertinent test results  Airway Mallampati: II TM Distance: >3 FB Neck ROM: Full    Dental  (+) Teeth Intact and Dental Advisory Given   Pulmonary neg pulmonary ROS,  breath sounds clear to auscultation  Pulmonary exam normal       Cardiovascular hypertension, Pt. on medications + CAD + dysrhythmias + Valvular Problems/Murmurs Rhythm:Regular Rate:Normal     Neuro/Psych  Neuromuscular disease negative psych ROS   GI/Hepatic negative GI ROS, Neg liver ROS,   Endo/Other  diabetes, Type 2, Oral Hypoglycemic Agents  Renal/GU negative Renal ROS  negative genitourinary   Musculoskeletal  (+) Arthritis -,   Abdominal   Peds  Hematology negative hematology ROS (+)   Anesthesia Other Findings   Reproductive/Obstetrics negative OB ROS                           Anesthesia Physical  Anesthesia Plan  ASA: III  Anesthesia Plan: General   Post-op Pain Management:    Induction: Intravenous  Airway Management Planned: LMA  Additional Equipment:   Intra-op Plan:   Post-operative Plan: Extubation in OR  Informed Consent: I have reviewed the patients History and Physical, chart, labs and discussed the procedure including the risks, benefits and alternatives for the proposed anesthesia with the patient or authorized representative who has indicated his/her understanding and acceptance.   Dental advisory given  Plan Discussed with: CRNA  Anesthesia Plan Comments:         Anesthesia Quick Evaluation

## 2012-10-17 NOTE — H&P (Signed)
[name] is an 77 y.o. male.   Chief Complaint:painful rt ankle HPI: has had ankle fusion with tenderness over distal tibial anf fibular screws  Past Medical History  Diagnosis Date  . Hypertension   . Fatigue   . Aortic valve disease     SCLEROSIS WITHOUT STENOSIS  . Hyperlipidemia   . PVC's (premature ventricular contractions)   . Sciatica   . Anemia   . Coronary artery disease CARDOLOGIST- DR BRACKBILL    cardiac catheterization 2005 / EF 50%  . Diabetes mellitus, type 2   . DDD (degenerative disc disease)   . RA (rheumatoid arthritis) RHEUMATOLOGIST-  DR Dareen Piano    SEVERE  . Chronic low back pain     CHRONIC NARCOTIC USE  . Systolic murmur     Past Surgical History  Procedure Laterality Date  . Retinal detachment surgery Left 2001  . Decompressive lumbar laminectomy level 3  07/17/2012    Procedure: DECOMPRESSIVE LUMBAR LAMINECTOMY LEVEL 3;  Surgeon: Drucilla Schmidt, MD;  Location: [facility];  Service: Orthopedics;  Laterality: N/A;  L3-4, L4-5 AND L5-S1  . Right hydrocelectomy/ spermatocelectomy  09-11-2001  . Carpal tunnel release Right 09-24-2006  . Open repair left quadriceps tendon  11-17-2007  . Lumbar spine surgery  10-07-2008    DECOMPRESSION   L2  - S1  . Left index / long fingers i & d , repair tendon, and pinning  09-18-2010  . Ankle arthrodesis Right 01-11-2011  . Cardiac catheterization  2005    EF 50% /  showed minimal coronary atherosclerosisi   . Hammer toe surgery      RIGHT SECOND TOE  . Transthoracic echocardiogram  06-13-2012  DR BRACKBILL    MILD LVH/ LVSF NORMAL/ EF 55-60%/ GRADE I DIASTOLIC DYSFUNCTION/  MILD AV AND MV REGURG/ AORTIC VALVE SCLEROSIS WITHOUT STENOSIS    Family History  Problem Relation Age of Onset  . Heart disease Father    Social History:  reports that he has never smoked. He has never used smokeless tobacco. He reports that he does not drink alcohol or use illicit drugs.  Allergies:  Allergies  Allergen Reactions   . Sulfa Drugs Cross Reactors Other (See Comments)    Unknown     Medications Prior to Admission  Medication Sig Dispense Refill  . acetaminophen (TYLENOL) 500 MG tablet Take 500 mg by mouth every 6 (six) hours as needed for pain.      Marland Kitchen amLODipine (NORVASC) 10 MG tablet Take 10 mg by mouth every morning.      Marland Kitchen aspirin EC 81 MG tablet Take 81 mg by mouth at bedtime.      . B Complex-C (B-COMPLEX WITH VITAMIN C) tablet Take 1 tablet by mouth 2 (two) times daily.      . Calcium Citrate-Vitamin D (CITRACAL + D PO) Take 1 tablet by mouth 2 (two) times daily.      . cholecalciferol (VITAMIN D) 1000 UNITS tablet Take 2,000 Units by mouth 2 (two) times daily.      . DULoxetine (CYMBALTA) 60 MG capsule Take 60 mg by mouth at bedtime.       . fentaNYL (DURAGESIC - DOSED MCG/HR) 25 MCG/HR Place 1 patch onto the skin every other day.       . ferrous sulfate 325 (65 FE) MG tablet Take 325 mg by mouth 2 (two) times daily.      . Garlic 1000 MG CAPS Take 1,000 mg by mouth 2 (two)  times daily.      Marland Kitchen glucosamine-chondroitin (COSAMIN DS) 500-400 MG tablet Take 1 tablet by mouth 2 (two) times daily.      Marland Kitchen HYDROcodone-acetaminophen (NORCO/VICODIN) 5-325 MG per tablet Take 1 tablet by mouth every 6 (six) hours as needed for pain.      Marland Kitchen leflunomide (ARAVA) 20 MG tablet Take 20 mg by mouth every morning.       Marland Kitchen lisinopril (PRINIVIL,ZESTRIL) 20 MG tablet Take 20 mg by mouth every morning.      . metFORMIN (GLUCOPHAGE) 500 MG tablet Take 2 tablets (1,000 mg total) by mouth daily with breakfast.  60 tablet  4  . Methylsulfonylmethane (MSM) 1000 MG CAPS Take 1,000 mg by mouth daily.      . Multiple Vitamin (MULTIVITAMIN WITH MINERALS) TABS Take 1 tablet by mouth 2 (two) times daily.      Marland Kitchen NASONEX 50 MCG/ACT nasal spray Place 1 spray into the nose daily as needed. Allergies      . Omega-3 Fatty Acids (FISH OIL) 1200 MG CAPS Take 1,200 mg by mouth 2 (two) times daily.      Marland Kitchen oxymetazoline (AFRIN) 0.05 % nasal  spray Place 2 sprays into the nose daily. As needed      . predniSONE (DELTASONE) 5 MG tablet Take 5 mg by mouth every morning.       . temazepam (RESTORIL) 30 MG capsule Take 30 mg by mouth at bedtime.      Marland Kitchen tiZANidine (ZANAFLEX) 4 MG capsule Take 4 mg by mouth 3 (three) times daily.      . traMADol (ULTRAM) 50 MG tablet Take 50 mg by mouth every 6 (six) hours as needed for pain.      Marland Kitchen triamcinolone cream (KENALOG) 0.1 % Apply 1 application topically 2 (two) times daily as needed. Rash        Results for orders placed during the hospital encounter of 10/17/12 (from the past 48 hour(s))  POCT I-STAT 4, (NA,K, GLUC, HGB,HCT)     Status: Abnormal   Collection Time    10/17/12 11:25 AM      Result Value Range   Sodium 141  135 - 145 mEq/L   Potassium 3.9  3.5 - 5.1 mEq/L   Glucose, Bld 121 (*) 70 - 99 mg/dL   HCT 09.8  11.9 - 14.7 %   Hemoglobin 15.6  13.0 - 17.0 g/dL   No results found.  ROS  Blood pressure 147/77, pulse 64, temperature 97.3 F (36.3 C), temperature source Oral, resp. rate 18, height 6\' 3"  (1.905 m), weight 89.359 kg (197 lb), SpO2 97.00%. Physical Exam  Constitutional: He is oriented to person, place, and time. He appears well-developed and well-nourished.  HENT:  Head: Normocephalic and atraumatic.  Right Ear: External ear normal.  Left Ear: External ear normal.  Nose: Nose normal.  Mouth/Throat: Oropharynx is clear and moist.  Eyes: Conjunctivae and EOM are normal. Pupils are equal, round, and reactive to light.  Neck: Normal range of motion. Neck supple.  Cardiovascular: Normal rate, regular rhythm, normal heart sounds and intact distal pulses.   Respiratory: Effort normal and breath sounds normal.  GI: Soft. Bowel sounds are normal.  Musculoskeletal: He exhibits tenderness.  Post rt ankle fusion with tenderness over tibial and fibular scews  Neurological: He is alert and oriented to person, place, and time. He has normal reflexes.  Skin: Skin is warm and  dry. There is erythema.  Temp   99.7  Psychiatric:  He has a normal mood and affect. His behavior is normal. Judgment and thought content normal.     Assessment/Plan Painful retained screws post ankle fusion Removal screws rt ankle  APLINGTON,JAMES P 10/17/2012, 12:59 PM

## 2012-10-17 NOTE — Telephone Encounter (Signed)
Advised wife will need to pay out of for Rx per  Dr. Patty Sermons

## 2012-10-17 NOTE — Transfer of Care (Signed)
Immediate Anesthesia Transfer of Care Note  Patient: [name]  Procedure(s) Performed: Procedure(s) (LRB): HARDWARE REMOVAL (Right)  Patient Location: Patient transported to PACU with oxygen via face mask at 4 Liters / Min  Anesthesia Type: General  Level of Consciousness: awake and alert   Airway & Oxygen Therapy: Patient Spontanous Breathing and Patient connected to face mask oxygen  Post-op Assessment: Report given to PACU RN and Post -op Vital signs reviewed and stable  Post vital signs: Reviewed and stable  Dentition: Teeth and oropharynx remain in pre-op condition  Complications: No apparent anesthesia complications

## 2012-10-17 NOTE — Anesthesia Postprocedure Evaluation (Signed)
  Anesthesia Post-op Note  Patient: [name]  Procedure(s) Performed: Procedure(s) (LRB): HARDWARE REMOVAL (Right)  Patient Location: PACU  Anesthesia Type: General  Level of Consciousness: awake and alert   Airway and Oxygen Therapy: Patient Spontanous Breathing  Post-op Pain: mild  Post-op Assessment: Post-op Vital signs reviewed, Patient's Cardiovascular Status Stable, Respiratory Function Stable, Patent Airway and No signs of Nausea or vomiting  Last Vitals:  Filed Vitals:   10/17/12 1515  BP: 126/68  Pulse: 68  Temp:   Resp: 16    Post-op Vital Signs: stable   Complications: No apparent anesthesia complications

## 2012-10-17 NOTE — Brief Op Note (Signed)
10/17/2012  3:26 PM  PATIENT:  Rolene Arbour  77 y.o. male  PRE-OPERATIVE DIAGNOSIS:  POST RIGHT ANKLE FUSION WITH RETAINED PAINFUL SCREWS OF RIGHT ANKLE  POST-OPERATIVE DIAGNOSIS:  POST RIGHT ANKLE FUSION WITH RETAINED PAINFUL SCREWS OF RIGHT ANKLE  PROCEDURE:  Procedure(s) with comments: HARDWARE REMOVAL (Right) - REMOVE SCREW RIGHT ANKLE  SURGEON:  Surgeon(s) and Role:    * Drucilla Schmidt, MD - Primary  PHYSICIAN ASSISTANT:   ASSISTANTS: nurse  ANESTHESIA:   local and general  EBL:  Total I/O In: 1620 [P.O.:120; I.V.:1500] Out: -   BLOOD ADMINISTERED:none  DRAINS: none   LOCAL MEDICATIONS USED:  MARCAINE     SPECIMEN:  No Specimen  DISPOSITION OF SPECIMEN:  N/A  COUNTS:  YES  TOURNIQUET:   Total Tourniquet Time Documented: Thigh (Right) - 52 minutes Total: Thigh (Right) - 52 minutes   DICTATION: .Other Dictation: Dictation Number [id number]  PLAN OF CARE: Discharge to home after PACU  PATIENT DISPOSITION:  PACU - hemodynamically stable.   Delay start of Pharmacological VTE agent (>24hrs) due to surgical blood loss or risk of bleeding: yes

## 2012-10-18 ENCOUNTER — Encounter (HOSPITAL_BASED_OUTPATIENT_CLINIC_OR_DEPARTMENT_OTHER): Payer: Self-pay | Admitting: Orthopedic Surgery

## 2012-10-18 NOTE — Op Note (Signed)
NAMESHEMUEL, HARKLEROAD NO.:  [id number]  MEDICAL RECORD NO.:  [id number]  LOCATION:                                FACILITY:  [facility]  PHYSICIAN:  Marlowe Kays, M.D.  DATE OF BIRTH:  03-Aug-1936  DATE OF PROCEDURE:  10/17/2012 DATE OF DISCHARGE:                              OPERATIVE REPORT   PREOPERATIVE DIAGNOSIS:  Painful fixation screws status post right ankle arthrodesis.  POSTOPERATIVE DIAGNOSIS:  Painful fixation screws status post right ankle arthrodesis.  PROCEDURE:  Removal of screws in the distal tibia and fibula, right ankle.  SURGEON:  Marlowe Kays, M.D.  ASSISTANT:  Nurse.  ANESTHESIA:  General.  PATHOLOGY AND INDICATIONS FOR PROCEDURE:  It has been several years since he had his fusion.  The screws have been painful for considerable period of time and he is ready to have the screws removed at this time. Final preoperative x-rays just done recently demonstrated breakage of one of the screws in the talus, which I advised him and his wife is a little worrisome for micro motion of the ankle arthrodesis site.  There was some radial clearance in certain areas in the ankle joint but nothing that would demonstrate a definite nonunion.  We discussed that we would address this issue postoperatively.  DESCRIPTION OF PROCEDURE:  Satisfactory general anesthesia, pneumatic tourniquet with the leg Esmarched out nonsterilely and tourniquet inflated to 250 mmHg.  The leg was then prepped with DuraPrep from midcalf to toes and draped in sterile field.  Time-out performed.  I then brought in the C-arm and was able to localize the heads of the 2 screws.  The one in the distal tibia was a little more complex because it had bone overgrowing it.  I first worked laterally and made incision based on the C-arm x-ray, found the screw and removed it.  There was small amount of tissue which was probably reactive tissue, but I sent it for culture anyway along with  the fluid.  I then irrigated the wound with sterile saline and infiltrated soft tissues with 0.5% Marcaine with adrenaline.  I closed the subcutaneous tissue with interrupted 2-0 Vicryl and skin with interrupted 3-0 nylon mattress sutures.  I then went medially and again using the C-arm, I made an incision where I anticipated the screw head to be.  It was not immediately visible and I had to remove a good bit of bone with a small rongeur until we found the screw head which was likewise removed.  This was the screw that had partially broken.  I took final x-ray of the ankle documenting the breakage and irrigated the wound well with sterile saline.  Again, infiltrated with Marcaine with adrenaline and closure as before.  Betadine, Adaptic, dry sterile dressing were applied.  Tourniquet was released.  He tolerated the procedure well.  At the time of this dictation, he was on his way to recovery room in satisfactory condition with no known complications.          ______________________________ Marlowe Kays, M.D.     JA/MEDQ  D:  10/17/2012  T:  10/18/2012  Job:  [id number]

## 2012-11-01 LAB — BODY FLUID CULTURE

## 2012-12-13 ENCOUNTER — Other Ambulatory Visit: Payer: Self-pay | Admitting: *Deleted

## 2012-12-13 DIAGNOSIS — G4700 Insomnia, unspecified: Secondary | ICD-10-CM

## 2012-12-14 MED ORDER — TEMAZEPAM 30 MG PO CAPS: 30.0000 mg | ORAL_CAPSULE | Freq: Every day | ORAL | Status: DC

## 2012-12-17 ENCOUNTER — Other Ambulatory Visit: Payer: Self-pay

## 2012-12-17 MED ORDER — AMLODIPINE BESYLATE 10 MG PO TABS: 10.0000 mg | ORAL_TABLET | Freq: Every morning | ORAL | Status: DC

## 2012-12-30 ENCOUNTER — Ambulatory Visit (HOSPITAL_COMMUNITY)
Admission: RE | Admit: 2012-12-30 | Discharge: 2012-12-30 | Disposition: A | Discharge status: Home / Self Care | Payer: Medicare Other | Source: Ambulatory Visit | Attending: Orthopedic Surgery | Admitting: Orthopedic Surgery

## 2012-12-30 ENCOUNTER — Other Ambulatory Visit (HOSPITAL_COMMUNITY): Payer: Self-pay | Admitting: Orthopedic Surgery

## 2012-12-30 DIAGNOSIS — M545 Low back pain: Secondary | ICD-10-CM

## 2012-12-30 DIAGNOSIS — Z1389 Encounter for screening for other disorder: Secondary | ICD-10-CM | POA: Insufficient documentation

## 2013-01-23 ENCOUNTER — Other Ambulatory Visit: Payer: Self-pay | Admitting: Cardiology

## 2013-01-24 ENCOUNTER — Ambulatory Visit (INDEPENDENT_AMBULATORY_CARE_PROVIDER_SITE_OTHER): Payer: Self-pay | Admitting: Cardiology

## 2013-01-24 ENCOUNTER — Other Ambulatory Visit: Payer: Self-pay | Admitting: Cardiology

## 2013-01-24 DIAGNOSIS — I119 Hypertensive heart disease without heart failure: Secondary | ICD-10-CM

## 2013-01-24 DIAGNOSIS — E785 Hyperlipidemia, unspecified: Secondary | ICD-10-CM

## 2013-01-24 DIAGNOSIS — R0989 Other specified symptoms and signs involving the circulatory and respiratory systems: Secondary | ICD-10-CM

## 2013-01-25 LAB — BASIC METABOLIC PANEL
BUN: 22 mg/dL (ref 6–23)
Chloride: 101 mEq/L (ref 96–112)
Glucose, Bld: 138 mg/dL — ABNORMAL HIGH (ref 70–99)
Potassium: 5.1 mEq/L (ref 3.5–5.3)
Sodium: 140 mEq/L (ref 135–145)

## 2013-01-25 LAB — LIPID PANEL
LDL Cholesterol: 109 mg/dL — ABNORMAL HIGH (ref 0–99)
Total CHOL/HDL Ratio: 4.4 Ratio
VLDL: 62 mg/dL — ABNORMAL HIGH (ref 0–40)

## 2013-01-25 LAB — HEPATIC FUNCTION PANEL
ALT: 22 U/L (ref 0–53)
Bilirubin, Direct: 0.1 mg/dL (ref 0.0–0.3)
Indirect Bilirubin: 0.7 mg/dL (ref 0.0–0.9)
Total Protein: 7.1 g/dL (ref 6.0–8.3)

## 2013-01-25 NOTE — Progress Notes (Signed)
Quick Note:  Please make copy of labs for patient visit. ______ 

## 2013-01-29 ENCOUNTER — Ambulatory Visit (INDEPENDENT_AMBULATORY_CARE_PROVIDER_SITE_OTHER): Payer: Medicare Other | Admitting: Cardiology

## 2013-01-29 ENCOUNTER — Telehealth: Payer: Self-pay | Admitting: Cardiology

## 2013-01-29 ENCOUNTER — Encounter: Payer: Self-pay | Admitting: Cardiology

## 2013-01-29 VITALS — BP 118/78 | HR 70 | Ht 75.0 in | Wt 205.4 lb

## 2013-01-29 DIAGNOSIS — I119 Hypertensive heart disease without heart failure: Secondary | ICD-10-CM

## 2013-01-29 DIAGNOSIS — M353 Polymyalgia rheumatica: Secondary | ICD-10-CM

## 2013-01-29 DIAGNOSIS — E785 Hyperlipidemia, unspecified: Secondary | ICD-10-CM

## 2013-01-29 NOTE — Assessment & Plan Note (Addendum)
The patient has a history of mild hyperglycemia.  He is on metformin.  He has not been having any hypoglycemic reactions.  His recent hemoglobin A1c is satisfactory at 6.2

## 2013-01-29 NOTE — Telephone Encounter (Signed)
Spoke with patient.

## 2013-01-29 NOTE — Progress Notes (Signed)
Rolene Arbour Date of Birth:  August 06, 1936 [facility] [address] [place], [place]  [address] [phone]         Fax   [phone]  History of Present Illness: This pleasant 77 year old gentleman is seen for a four-month followup office visit. He has a past history of essential hypertension, symptomatic PVCs, a past history of polymyalgia rheumatica, and a present history of severe rheumatoid arthritis. He is followed closely by his rheumatologist Dr. Dareen Piano. The patient does not have any history of ischemic heart disease. He has a known systolic heart murmur and his last echocardiogram 06/13/12 showed normal left ventricular systolic function and showed aortic valve sclerosis without stenosis.  Since we last saw him he has had additional foot surgery by Dr. Simonne Come on his right foot.   Current Outpatient Prescriptions  Medication Sig Dispense Refill  . acetaminophen (TYLENOL) 500 MG tablet Take 500 mg by mouth every 6 (six) hours as needed for pain.      Marland Kitchen amLODipine (NORVASC) 10 MG tablet Take 1 tablet (10 mg total) by mouth every morning.  90 tablet  3  . aspirin EC 81 MG tablet Take 81 mg by mouth at bedtime.      . B Complex-C (B-COMPLEX WITH VITAMIN C) tablet Take 1 tablet by mouth 2 (two) times daily.      . Calcium Citrate-Vitamin D (CITRACAL + D PO) Take 1 tablet by mouth 2 (two) times daily.      . cholecalciferol (VITAMIN D) 1000 UNITS tablet Take 2,000 Units by mouth 2 (two) times daily.      . DULoxetine (CYMBALTA) 60 MG capsule Take 60 mg by mouth at bedtime.       . fentaNYL (DURAGESIC - DOSED MCG/HR) 25 MCG/HR Place 1 patch onto the skin every other day.       . ferrous sulfate 325 (65 FE) MG tablet Take 325 mg by mouth 2 (two) times daily.      . Garlic 1000 MG CAPS Take 1,000 mg by mouth 2 (two) times daily.      Marland Kitchen glucosamine-chondroitin (COSAMIN DS) 500-400 MG tablet Take 1 tablet by mouth 2 (two) times daily.      Marland Kitchen HYDROcodone-acetaminophen  (NORCO/VICODIN) 5-325 MG per tablet Take 1 tablet by mouth every 6 (six) hours as needed for pain.      Marland Kitchen leflunomide (ARAVA) 20 MG tablet Take 20 mg by mouth every morning.       Marland Kitchen lisinopril (PRINIVIL,ZESTRIL) 20 MG tablet Take 20 mg by mouth every morning.      . metFORMIN (GLUCOPHAGE) 500 MG tablet TAKE 2 TABLETS (1,000 MG TOTAL) BY MOUTH DAILY WITH BREAKFAST.  60 tablet  11  . methocarbamol (ROBAXIN) 500 MG tablet Take 500 mg by mouth 3 (three) times daily.      . Methylsulfonylmethane (MSM) 1000 MG CAPS Take 1,000 mg by mouth daily.      . Multiple Vitamin (MULTIVITAMIN WITH MINERALS) TABS Take 1 tablet by mouth 2 (two) times daily.      Marland Kitchen NASONEX 50 MCG/ACT nasal spray Place 1 spray into the nose daily as needed. Allergies      . Omega-3 Fatty Acids (FISH OIL) 1200 MG CAPS Take 1,200 mg by mouth 2 (two) times daily.      Marland Kitchen oxymetazoline (AFRIN) 0.05 % nasal spray Place 2 sprays into the nose daily. As needed      . predniSONE (DELTASONE) 5 MG tablet Take 5 mg  by mouth every morning.       . pregabalin (LYRICA) 75 MG capsule Take 75 mg by mouth 2 (two) times daily.      . temazepam (RESTORIL) 30 MG capsule Take 1 capsule (30 mg total) by mouth at bedtime.  30 capsule  5  . tiZANidine (ZANAFLEX) 4 MG capsule Take 4 mg by mouth 3 (three) times daily.      . traMADol-acetaminophen (ULTRACET) 37.5-325 MG per tablet Take 1 tablet by mouth every 6 (six) hours as needed for pain.       No current facility-administered medications for this visit.    Allergies  Allergen Reactions  . Sulfa Drugs Cross Reactors Other (See Comments)    Unknown     Patient Active Problem List   Diagnosis Date Noted  . Rheumatoid arthritis 07/10/2011  . Polymyalgia rheumatica 01/10/2011  . Benign hypertensive heart disease without heart failure 01/10/2011  . Dyslipidemia 01/10/2011  . Type II or unspecified type diabetes mellitus without mention of complication, uncontrolled 01/10/2011    History  Smoking  status  . Never Smoker   Smokeless tobacco  . Never Used    History  Alcohol Use No    Family History  Problem Relation Age of Onset  . Heart disease Father     Review of Systems: Constitutional: no fever chills diaphoresis or fatigue or change in weight.  Head and neck: no hearing loss, no epistaxis, no photophobia or visual disturbance. Respiratory: No cough, shortness of breath or wheezing. Cardiovascular: No chest pain peripheral edema, palpitations. Gastrointestinal: No abdominal distention, no abdominal pain, no change in bowel habits hematochezia or melena. Genitourinary: No dysuria, no frequency, no urgency, no nocturia. Musculoskeletal:No arthralgias, no back pain, no gait disturbance or myalgias. Neurological: No dizziness, no headaches, no numbness, no seizures, no syncope, no weakness, no tremors. Hematologic: No lymphadenopathy, no easy bruising. Psychiatric: No confusion, no hallucinations, no sleep disturbance.    Physical Exam: Filed Vitals:   01/29/13 1324  BP: 118/78  Pulse: 70   general reveals an elderly gentleman in no acute distress.  He is having moderate chronic back pain.  He is using a rolling walker to ambulate.The head and neck exam reveals pupils equal and reactive.  Extraocular movements are full.  There is no scleral icterus.  The mouth and pharynx are normal.  The neck is supple.  The carotids reveal no bruits.  The jugular venous pressure is normal.  The  thyroid is not enlarged.  There is no lymphadenopathy.  The chest is clear to percussion and auscultation.  There are no rales or rhonchi.  Expansion of the chest is symmetrical.  The precordium is quiet.  The first heart sound is normal.  The second heart sound is physiologically split.  There is no murmur gallop rub or click.  There is no abnormal lift or heave.  The abdomen is soft and nontender.  The bowel sounds are normal.  The liver and spleen are not enlarged.  There are no abdominal masses.   There are no abdominal bruits.  Extremities reveal good pedal pulses.  There is no phlebitis or edema.  There is no cyanosis or clubbing.  Strength is normal and symmetrical in all extremities.  There is no lateralizing weakness.  There are no sensory deficits.  The skin is warm and dry.  There is no rash.     Assessment / Plan: Continue same medication.  Recheck in 4 months for office visit and once  the lipid panel hepatic function panel and basal metabolic panel. His orthopedist Dr. Simonne Come is retiring and the patient will be followed by Dr. Darrelyn Hillock.

## 2013-01-29 NOTE — Assessment & Plan Note (Signed)
The patient has a history of dyslipidemia.  His lipids are less satisfactory today because of inactivity and also he is under increased stress from this chronic pain syndrome.  He has not been experiencing any symptoms to suggest ischemic heart disease or angina pectoris.

## 2013-01-29 NOTE — Patient Instructions (Addendum)
Your physician wants you to follow-up in: 4 months  You will receive a reminder letter in the mail two months in advance. If you don't receive a letter, please call our office to schedule the follow-up appointment.  Your physician recommends that you return for a FASTING lipid profile: 4 months   Your physician recommends that you continue on your current medications as directed. Please refer to the Current Medication list given to you today.

## 2013-01-29 NOTE — Assessment & Plan Note (Signed)
Blood pressure was remaining stable on current therapy.  He is not able to exercise because of his orthopedic problems.  His weight is down 1 pound.  He is not having any symptoms of angina pectoris or congestive heart failure.  No palpitations.  No dizziness or syncope.

## 2013-01-29 NOTE — Telephone Encounter (Signed)
New problem    Discuss appt that he has at 1:30 pm today.

## 2013-01-29 NOTE — Assessment & Plan Note (Signed)
Patient is followed by rheumatology for his polymyalgia rheumatica.  The patient is in chronic severe pain from his back.  He is on 4 different types of pain medication.  Last month he had had a cortisone injection given by Dr. Algis Greenhouse is into his back but unfortunately did not have any lasting relief.

## 2013-03-18 ENCOUNTER — Other Ambulatory Visit: Payer: Self-pay | Admitting: Orthopedic Surgery

## 2013-03-18 DIAGNOSIS — M47817 Spondylosis without myelopathy or radiculopathy, lumbosacral region: Secondary | ICD-10-CM

## 2013-03-20 ENCOUNTER — Ambulatory Visit
Admission: RE | Admit: 2013-03-20 | Discharge: 2013-03-20 | Disposition: A | Discharge status: Home / Self Care | Payer: Medicare Other | Source: Ambulatory Visit | Attending: Orthopedic Surgery | Admitting: Orthopedic Surgery

## 2013-03-20 ENCOUNTER — Other Ambulatory Visit: Payer: Medicare Other

## 2013-03-20 DIAGNOSIS — M47817 Spondylosis without myelopathy or radiculopathy, lumbosacral region: Secondary | ICD-10-CM

## 2013-04-02 ENCOUNTER — Telehealth: Payer: Self-pay | Admitting: Cardiology

## 2013-04-02 NOTE — Telephone Encounter (Signed)
Spoke with patient and will have  Dr. Patty Sermons sign clearance if ok Friday when he is back in the office

## 2013-04-02 NOTE — Telephone Encounter (Signed)
New problem   Pt need to speak to you concerning a release for pt to have back surgery. Pt's spouse want to speak to you today.

## 2013-04-07 NOTE — Telephone Encounter (Signed)
Faxed clearance to orthopedic office

## 2013-04-10 ENCOUNTER — Encounter (HOSPITAL_COMMUNITY): Payer: Self-pay | Admitting: Pharmacy Technician

## 2013-04-13 ENCOUNTER — Emergency Department (HOSPITAL_COMMUNITY)
Admission: EM | Admit: 2013-04-13 | Discharge: 2013-04-13 | Disposition: A | Discharge status: Home / Self Care | Payer: Medicare Other | Attending: Emergency Medicine | Admitting: Emergency Medicine

## 2013-04-13 ENCOUNTER — Encounter (HOSPITAL_COMMUNITY): Payer: Self-pay | Admitting: Emergency Medicine

## 2013-04-13 DIAGNOSIS — D649 Anemia, unspecified: Secondary | ICD-10-CM | POA: Insufficient documentation

## 2013-04-13 DIAGNOSIS — E119 Type 2 diabetes mellitus without complications: Secondary | ICD-10-CM | POA: Insufficient documentation

## 2013-04-13 DIAGNOSIS — N400 Enlarged prostate without lower urinary tract symptoms: Secondary | ICD-10-CM | POA: Insufficient documentation

## 2013-04-13 DIAGNOSIS — R011 Cardiac murmur, unspecified: Secondary | ICD-10-CM | POA: Insufficient documentation

## 2013-04-13 DIAGNOSIS — Z7982 Long term (current) use of aspirin: Secondary | ICD-10-CM | POA: Insufficient documentation

## 2013-04-13 DIAGNOSIS — Z882 Allergy status to sulfonamides status: Secondary | ICD-10-CM | POA: Insufficient documentation

## 2013-04-13 DIAGNOSIS — I359 Nonrheumatic aortic valve disorder, unspecified: Secondary | ICD-10-CM | POA: Insufficient documentation

## 2013-04-13 DIAGNOSIS — I2510 Atherosclerotic heart disease of native coronary artery without angina pectoris: Secondary | ICD-10-CM | POA: Insufficient documentation

## 2013-04-13 DIAGNOSIS — M069 Rheumatoid arthritis, unspecified: Secondary | ICD-10-CM | POA: Insufficient documentation

## 2013-04-13 DIAGNOSIS — M545 Low back pain, unspecified: Secondary | ICD-10-CM | POA: Insufficient documentation

## 2013-04-13 DIAGNOSIS — Z79899 Other long term (current) drug therapy: Secondary | ICD-10-CM | POA: Insufficient documentation

## 2013-04-13 DIAGNOSIS — G8929 Other chronic pain: Secondary | ICD-10-CM | POA: Insufficient documentation

## 2013-04-13 DIAGNOSIS — M549 Dorsalgia, unspecified: Secondary | ICD-10-CM

## 2013-04-13 DIAGNOSIS — F411 Generalized anxiety disorder: Secondary | ICD-10-CM | POA: Insufficient documentation

## 2013-04-13 DIAGNOSIS — IMO0002 Reserved for concepts with insufficient information to code with codable children: Secondary | ICD-10-CM | POA: Insufficient documentation

## 2013-04-13 DIAGNOSIS — I10 Essential (primary) hypertension: Secondary | ICD-10-CM | POA: Insufficient documentation

## 2013-04-13 DIAGNOSIS — E785 Hyperlipidemia, unspecified: Secondary | ICD-10-CM | POA: Insufficient documentation

## 2013-04-13 MED ORDER — HYDROCODONE-ACETAMINOPHEN 5-325 MG PO TABS: 2.0000 | ORAL_TABLET | ORAL | Status: DC | PRN

## 2013-04-13 MED ORDER — HYDROCODONE-ACETAMINOPHEN 5-325 MG PO TABS
2.0000 | ORAL_TABLET | Freq: Once | ORAL | Status: AC
  Administered 2013-04-13: 2 via ORAL
  Filled 2013-04-13: qty 2

## 2013-04-13 NOTE — ED Provider Notes (Signed)
CSN: [id number]     Arrival date & time 04/13/13  1705 History   First MD Initiated Contact with Patient 04/13/13 1719     Chief Complaint  Patient presents with  . Back Pain   (Consider location/radiation/quality/duration/timing/severity/associated sxs/prior Treatment) HPI Comments: Patient presents with an exacerbation of his ongoing back pain. He has a history of chronic pain and degenerative disease to the lumbar spine. He is scheduled to have a lumbar fusion on September 3 by Dr. Shon Baton. He was previously taking Vicodin for pain and he ran out of it about 10 days ago. He's requesting some pain medication until the time per the surgery. He has ongoing pain to his lumbar spine primarily on the left side it radiates down his left leg. He denies any numbness or weakness in the legs. He denies any abdominal pain. There is no loss of bowel or bladder control. He denies any new symptoms or recent injuries.  Patient is a 77 y.o. male presenting with back pain.  Back Pain Associated symptoms: no abdominal pain, no chest pain, no fever, no headaches, no numbness and no weakness     Past Medical History  Diagnosis Date  . Hypertension   . Fatigue   . Aortic valve disease     SCLEROSIS WITHOUT STENOSIS  . Hyperlipidemia   . PVC's (premature ventricular contractions)   . Sciatica   . Anemia   . Coronary artery disease CARDOLOGIST- DR BRACKBILL    cardiac catheterization 2005 / EF 50%  . Diabetes mellitus, type 2   . DDD (degenerative disc disease)   . RA (rheumatoid arthritis) RHEUMATOLOGIST-  DR Dareen Piano    SEVERE  . Chronic low back pain     CHRONIC NARCOTIC USE  . Systolic murmur    Past Surgical History  Procedure Laterality Date  . Retinal detachment surgery Left 2001  . Decompressive lumbar laminectomy level 3  07/17/2012    Procedure: DECOMPRESSIVE LUMBAR LAMINECTOMY LEVEL 3;  Surgeon: Drucilla Schmidt, MD;  Location: [facility];  Service: Orthopedics;  Laterality: N/A;  L3-4, L4-5  AND L5-S1  . Right hydrocelectomy/ spermatocelectomy  09-11-2001  . Carpal tunnel release Right 09-24-2006  . Open repair left quadriceps tendon  11-17-2007  . Lumbar spine surgery  10-07-2008    DECOMPRESSION   L2  - S1  . Left index / long fingers i & d , repair tendon, and pinning  09-18-2010  . Ankle arthrodesis Right 01-11-2011  . Cardiac catheterization  2005    EF 50% /  showed minimal coronary atherosclerosisi   . Hammer toe surgery      RIGHT SECOND TOE  . Transthoracic echocardiogram  06-13-2012  DR BRACKBILL    MILD LVH/ LVSF NORMAL/ EF 55-60%/ GRADE I DIASTOLIC DYSFUNCTION/  MILD AV AND MV REGURG/ AORTIC VALVE SCLEROSIS WITHOUT STENOSIS  . Hardware removal Right 10/17/2012    Procedure: HARDWARE REMOVAL;  Surgeon: Drucilla Schmidt, MD;  Location: [facility];  Service: Orthopedics;  Laterality: Right;  REMOVE SCREW RIGHT ANKLE   Family History  Problem Relation Age of Onset  . Heart disease Father    History  Substance Use Topics  . Smoking status: Never Smoker   . Smokeless tobacco: Never Used  . Alcohol Use: No    Review of Systems  Constitutional: Negative for fever, chills, diaphoresis and fatigue.  HENT: Negative for congestion, rhinorrhea and sneezing.   Eyes: Negative.   Respiratory: Negative for cough, chest tightness and shortness  of breath.   Cardiovascular: Negative for chest pain and leg swelling.  Gastrointestinal: Negative for nausea, vomiting, abdominal pain, diarrhea and blood in stool.  Genitourinary: Negative for frequency, hematuria, flank pain and difficulty urinating.  Musculoskeletal: Positive for back pain. Negative for arthralgias.  Skin: Negative for rash.  Neurological: Negative for dizziness, speech difficulty, weakness, numbness and headaches.    Allergies  Sulfa drugs cross reactors  Home Medications   Current Outpatient Rx  Name  Route  Sig  Dispense  Refill  . amLODipine (NORVASC) 10 MG tablet   Oral   Take 1  tablet (10 mg total) by mouth every morning.   90 tablet   3   . aspirin EC 81 MG tablet   Oral   Take 81 mg by mouth at bedtime.         . B Complex-C (B-COMPLEX WITH VITAMIN C) tablet   Oral   Take 1 tablet by mouth 2 (two) times daily.         . Calcium Citrate-Vitamin D (CITRACAL + D PO)   Oral   Take 1 tablet by mouth 2 (two) times daily.         . celecoxib (CELEBREX) 200 MG capsule   Oral   Take 200 mg by mouth 2 (two) times daily.         . cholecalciferol (VITAMIN D) 1000 UNITS tablet   Oral   Take 2,000 Units by mouth 2 (two) times daily.         . DULoxetine (CYMBALTA) 60 MG capsule   Oral   Take 60 mg by mouth at bedtime.          . fentaNYL (DURAGESIC - DOSED MCG/HR) 12 MCG/HR   Transdermal   Place 1 patch onto the skin every other day.         . fentaNYL (DURAGESIC - DOSED MCG/HR) 25 MCG/HR   Transdermal   Place 1 patch onto the skin every other day.          . ferrous sulfate 325 (65 FE) MG tablet   Oral   Take 325 mg by mouth 2 (two) times daily.         . Garlic 1000 MG CAPS   Oral   Take 1,000 mg by mouth 2 (two) times daily.         Marland Kitchen glucosamine-chondroitin (COSAMIN DS) 500-400 MG tablet   Oral   Take 1 tablet by mouth 2 (two) times daily.         Marland Kitchen HYDROcodone-acetaminophen (NORCO/VICODIN) 5-325 MG per tablet   Oral   Take 1 tablet by mouth every 6 (six) hours as needed for pain.         Marland Kitchen leflunomide (ARAVA) 20 MG tablet   Oral   Take 20 mg by mouth every morning.          Marland Kitchen lisinopril (PRINIVIL,ZESTRIL) 20 MG tablet   Oral   Take 20 mg by mouth every morning.         . metFORMIN (GLUCOPHAGE) 500 MG tablet   Oral   Take 1,000 mg by mouth 2 (two) times daily with a meal.         . methocarbamol (ROBAXIN) 500 MG tablet   Oral   Take 500 mg by mouth 3 (three) times daily.         . Methylsulfonylmethane (MSM) 1000 MG CAPS   Oral   Take 1,000 mg by mouth daily.         Marland Kitchen  Multiple Vitamin  (MULTIVITAMIN WITH MINERALS) TABS   Oral   Take 1 tablet by mouth 2 (two) times daily.         Marland Kitchen NASONEX 50 MCG/ACT nasal spray   Nasal   Place 1 spray into the nose daily as needed. Allergies         . Omega-3 Fatty Acids (FISH OIL) 1200 MG CAPS   Oral   Take 1,200 mg by mouth 2 (two) times daily.         Marland Kitchen oxymetazoline (AFRIN) 0.05 % nasal spray   Nasal   Place 2 sprays into the nose daily. As needed for congestion         . predniSONE (DELTASONE) 5 MG tablet   Oral   Take 5 mg by mouth every morning.          . pregabalin (LYRICA) 75 MG capsule   Oral   Take 75 mg by mouth 2 (two) times daily.         . tamsulosin (FLOMAX) 0.4 MG CAPS capsule   Oral   Take 0.4 mg by mouth daily.         . temazepam (RESTORIL) 30 MG capsule   Oral   Take 1 capsule (30 mg total) by mouth at bedtime.   30 capsule   5   . tiZANidine (ZANAFLEX) 4 MG capsule   Oral   Take 4 mg by mouth 3 (three) times daily.         . traMADol-acetaminophen (ULTRACET) 37.5-325 MG per tablet   Oral   Take 1 tablet by mouth every 6 (six) hours as needed for pain.         Marland Kitchen HYDROcodone-acetaminophen (NORCO/VICODIN) 5-325 MG per tablet   Oral   Take 2 tablets by mouth every 4 (four) hours as needed for pain.   20 tablet   0    BP 144/104  Pulse 77  Temp(Src) 97.4 F (36.3 C) (Oral)  Resp 16  Ht 6\' 3"  (1.905 m)  Wt 200 lb (90.719 kg)  BMI 25 kg/m2  SpO2 99% Physical Exam  Constitutional: He is oriented to person, place, and time. He appears well-developed and well-nourished.  HENT:  Head: Normocephalic and atraumatic.  Eyes: Pupils are equal, round, and reactive to light.  Neck: Normal range of motion. Neck supple.  Cardiovascular: Normal rate, regular rhythm and normal heart sounds.   Pulmonary/Chest: Effort normal and breath sounds normal. No respiratory distress. He has no wheezes. He has no rales. He exhibits no tenderness.  Abdominal: Soft. Bowel sounds are normal.  There is no tenderness. There is no rebound and no guarding.  Musculoskeletal: Normal range of motion. He exhibits no edema.  Positive tenderness to the lower lumbar spine in the left sciatic nerve. He has normal sensation to the feet bilaterally. Has oral motor function in the legs. Pedal pulses are intact and symmetric  Lymphadenopathy:    He has no cervical adenopathy.  Neurological: He is alert and oriented to person, place, and time.  Skin: Skin is warm and dry. No rash noted.  Psychiatric: He has a normal mood and affect.    ED Course  Procedures (including critical care time) Labs Review Labs Reviewed - No data to display Imaging Review No results found.  MDM   1. Back pain with radiation    Patient presents with exacerbation of his chronic back pain. He ran out of his hydrocodone. He was given a prescription for hydrocodone.  He was given a dose here in the ED. He has no abdominal pain pulse deficits or other symptoms suggestive of aneurysm.    Rolan Bucco, MD 04/13/13 [phone]

## 2013-04-13 NOTE — ED Notes (Addendum)
Pt c/o low back pain ongoing for years. Pt reports that he is scheduled for his 4 th back surgery on Wednesday. Pt reports home medications ineffective. Pt reports that he cannot get his pain medication refilled til tomorrow or Tuesday. Pt reports burning pain down left leg with twitching. Pt denies urinary problems at present. Pt does take flomax.

## 2013-04-15 ENCOUNTER — Encounter (HOSPITAL_COMMUNITY)
Admission: RE | Admit: 2013-04-15 | Discharge: 2013-04-15 | Disposition: A | Discharge status: Home / Self Care | Payer: Medicare Other | Source: Ambulatory Visit | Attending: Orthopedic Surgery | Admitting: Orthopedic Surgery

## 2013-04-15 ENCOUNTER — Encounter (HOSPITAL_COMMUNITY): Payer: Self-pay

## 2013-04-15 HISTORY — DX: Anxiety disorder, unspecified: F41.9

## 2013-04-15 HISTORY — DX: Benign prostatic hyperplasia without lower urinary tract symptoms: N40.0

## 2013-04-15 LAB — CBC
HCT: 41.5 % (ref 39.0–52.0)
Hemoglobin: 13.8 g/dL (ref 13.0–17.0)
MCH: 29.1 pg (ref 26.0–34.0)
RBC: 4.74 MIL/uL (ref 4.22–5.81)

## 2013-04-15 LAB — BASIC METABOLIC PANEL
BUN: 18 mg/dL (ref 6–23)
Chloride: 105 mEq/L (ref 96–112)
GFR calc Af Amer: >90 mL/min (ref >90)
GFR calc non Af Amer: 80 mL/min — ABNORMAL LOW (ref >90)
Glucose, Bld: 132 mg/dL — ABNORMAL HIGH (ref 70–99)
Potassium: 4.4 mEq/L (ref 3.5–5.1)
Sodium: 143 mEq/L (ref 135–145)

## 2013-04-15 LAB — TYPE AND SCREEN: ABO/RH(D): O POS

## 2013-04-15 MED ORDER — CEFAZOLIN SODIUM-DEXTROSE 2-3 GM-% IV SOLR
2.0000 g | INTRAVENOUS | Status: AC
  Administered 2013-04-16 (×2): 2 g via INTRAVENOUS
  Filled 2013-04-15: qty 50

## 2013-04-15 NOTE — Pre-Procedure Instructions (Signed)
MARSTON MCCADDEN  04/15/2013   Your procedure is scheduled on:  04/16/13  Report to [facility] at 630 AM.  Call this number if you have problems the morning of surgery: [phone]   Remember:   Do not eat food or drink liquids after midnight.   Take these medicines the morning of surgery with A SIP OF WATER: norvasc,cymbalta,pain patch,hydrocodone,robaxin,flomax   Do not wear jewelry, make-up or nail polish.  Do not wear lotions, powders, or perfumes. You may wear deodorant.  Do not shave 48 hours prior to surgery. Men may shave face and neck.  Do not bring valuables to the hospital.  [facility] is not responsible                   for any belongings or valuables.  Contacts, dentures or bridgework may not be worn into surgery.  Leave suitcase in the car. After surgery it may be brought to your room.  For patients admitted to the hospital, checkout time is 11:00 AM the day of  discharge.   Patients discharged the day of surgery will not be allowed to drive  home.  Name and phone number of your driver: family  Special Instructions: Shower using CHG 2 nights before surgery and the night before surgery.  If you shower the day of surgery use CHG.  Use special wash - you have one bottle of CHG for all showers.  You should use approximately 1/3 of the bottle for each shower.   Please read over the following fact sheets that you were given: Pain Booklet, Coughing and Deep Breathing, Blood Transfusion Information, MRSA Information and Surgical Site Infection Prevention

## 2013-04-15 NOTE — H&P (Signed)
History of Present Illness The patient is a 77 year old male who presents today for follow up of their back. The patient is being followed for their right-sided back pain. They are now 8 month(s) (post op Lateral Decompression Laminectomy L3-4, L4-5, L5-S1 by Dr Simonne Come and Dr Darrelyn Hillock. He is 8 weeks post L5-S1 ESI on the right by Dr Ethelene Hal which did not help at all.) out. Symptoms reported today include: pain (mostly in his right hip going down into his right leg and right ankle, started on the right side but now has moved into the left side on 03/27/2013. Pain in bilateral knees and bilateral hips. Constant pain.), pain at night, aching (and cramping), stiffness (bilateral legs), pain with weightbearing, difficulty ambulating, numbness (mostly in his right shin), burning (and stinging and patient says he feels heat when burning sensation occurs), urinary incontinence (frequency) and leg pain. The patient states that they are doing poorly. The following medication has been used for pain control: Hydrocodone (Robaxin, Tramadol, Lyrica, and Fentanyl and ). The patient reports their current pain level to be severe and 9 / 10 ( he has pain all of the time.). The patient presents today following CT scan (done on 03/20/2013 at [facility]). Note for "Follow-up back": Referred from Dr Simonne Come for take over of care. He states that he needs refills on Hydrocodone, Lyrica, Methacarbamol, tramadol.    Allergies Sulfamethoxazole *Sulfonamides** SULFA. 12/01/2004 (Marked as Inactive)   Social History Living situation. live with spouse Illicit drug use. no Exercise. Exercises daily; does other Tobacco / smoke exposure. no Pain Contract. no Marital status. married Children. 3 Alcohol use. never consumed alcohol Tobacco use. Never smoker. never smoker Drug/Alcohol Rehab (Previously). no Drug/Alcohol Rehab (Currently). no Current work status. working full  time   Medication History TraMADol HCl (50MG  Tablet, 1 (one) Tablet Oral Q 8 HR, Taken starting 03/25/2013) Active. (DDB Ander Purpura RF [facility]/25[facility]13) Robaxin (500MG  Tablet, 1 (one) Tablet Oral daily, Taken starting 03/24/2013) Active. (Filled per Dr. Shon Baton to [facility]DDB) Lyrica (75MG  Capsule, 1 (one) Oral 1 PO BID, Taken starting 03/18/2013) Active. (DDB/MLF (8/5/20014) [facility] [id number]) Norco (5-325MG  Tablet, 1 (one) Tablet Oral three times daily for break through pain, Taken starting 03/18/2013) Active. PredniSONE (Pak) (5MG  Tablet, 1 (one) Tablet Oral as directed, Taken starting 02/18/2013) Active. Cymbalta (60MG  Capsule DR Part, 1 Oral TAKE 1 CAPSULE DAILY., Taken starting 02/05/2013) Active. (jpa/tmp erx) Robaxin (500MG  Tablet, 1 (one) Tablet Oral three times daily, Taken starting 01/10/2013) Active. (jpa/tmp erx) Symphony Double Pumping System Active. (2 month schedule infusion) PredniSONE (5MG /5ML Solution, Oral) Active. FentaNYL (25MCG/HR Patch 72HR, 1 Transdermal every two days) Active. Vitamin D ( Oral) Specific dose unknown - Active. (QD) Super B-Complex ( Oral) Active. (QD) Fish Oil ( Oral) Specific dose unknown - Active. (QD) Garlic ( Oral) Specific dose unknown - Active. (QD) Multi Vitamin Mens ( Oral) Specific dose unknown - Active. (QD) Cosamin DS (500-400MG  Tablet, Oral) Active. (QD) Citracal/Vitamin D ( Oral) Specific dose unknown - Active. (QD) Magnesium ( Oral) Specific dose unknown - Active. (QD) AmLODIPine Besylate ( Oral) Specific dose unknown - Active. (QD) Temazepam (15MG  Capsule, Oral) Active. (QD) MetFORMIN HCl ( Oral) Specific dose unknown - Active. (QD) Lisinopril ( Oral) Specific dose unknown - Active. (QD) Leflunomide (20MG  Tablet, Oral) Active. (QD)   Objective Transcription No incontinence of bowel and bladder. No shortness of breath or chest pain. His compartments are soft and nontender. The CT  scan of his back  was done on 03/20/2013. It shows a grade 1 anterior listhesis at L4-L5. No new fracture. He has no new vertebral body fracture. He does have what appears to be an acute pars defect at L4. There is moderate to severe central and lateral recess stenosis. He has advanced degenerative disc disease and facet hypertrophy at 5-1 but no significant stenosis. There is moderate spinal stenosis at 3-4 again with the prior midline laminotomy site.    Assessment & Plan Pain, Lumbar (LBP) (724.2) Current Plans  l Risks of surgery include, but are not limited to: Death, stroke, paralysis, nerve root damage/injury, bleeding, blood clots, loss of bowel/bladder control, sexual dysfunction, retrograde ejaculation, hardware failure, or malposition, spinal fluid leak, adjacent segment disease, non-union, need for further surgery, ongoing or worse pain, injury to bladder, bowel and abdominal contents, infection and recurrent disc herniation   Chronic pain syndrome    Assessments Transcription  At this point in time, the patient is having ongoing radicular leg pain and increasing back pain. I believe it is due to the slip and pars defect at L4-L5. To address this he would need a revision decompression and an instrumented fusion to prevent the slip from worsening.    Plans Transcription  I have reviewed this with the patient and his wife including the risks which include infection, bleeding, nerve damage, death, stroke, paralysis, failure to heal, need for further surgery, ongoing or worse pain, loss of bowel and bladder control, adjacent segment disease, no improvement in his pain. They have both expressed an understanding of this. We are simply waiting clearance from his cardiologist and rheumatologist. He has indicated he would like to proceed with surgery. We will submit the forms for insurance and once we have this we will move in an expedited fashion per his request.

## 2013-04-15 NOTE — Progress Notes (Signed)
Dr Patty Sermons called for cardiac release.

## 2013-04-16 ENCOUNTER — Encounter (HOSPITAL_COMMUNITY)
Admission: RE | Disposition: A | Discharge status: Home / Self Care | Payer: Self-pay | Source: Ambulatory Visit | Attending: Orthopedic Surgery

## 2013-04-16 ENCOUNTER — Encounter (HOSPITAL_COMMUNITY): Payer: Self-pay | Admitting: Anesthesiology

## 2013-04-16 ENCOUNTER — Inpatient Hospital Stay (HOSPITAL_COMMUNITY): Payer: Medicare Other

## 2013-04-16 ENCOUNTER — Inpatient Hospital Stay (HOSPITAL_COMMUNITY): Payer: Medicare Other | Admitting: Anesthesiology

## 2013-04-16 ENCOUNTER — Inpatient Hospital Stay (HOSPITAL_COMMUNITY)
Admission: RE | Admit: 2013-04-16 | Discharge: 2013-04-19 | DRG: 460 | Disposition: A | Discharge status: Home / Self Care | Payer: Medicare Other | Source: Ambulatory Visit | Attending: Orthopedic Surgery | Admitting: Orthopedic Surgery

## 2013-04-16 DIAGNOSIS — I2510 Atherosclerotic heart disease of native coronary artery without angina pectoris: Secondary | ICD-10-CM | POA: Diagnosis present

## 2013-04-16 DIAGNOSIS — E119 Type 2 diabetes mellitus without complications: Secondary | ICD-10-CM | POA: Diagnosis present

## 2013-04-16 DIAGNOSIS — M48061 Spinal stenosis, lumbar region without neurogenic claudication: Principal | ICD-10-CM | POA: Diagnosis present

## 2013-04-16 DIAGNOSIS — M4310 Spondylolisthesis, site unspecified: Secondary | ICD-10-CM | POA: Diagnosis present

## 2013-04-16 DIAGNOSIS — I10 Essential (primary) hypertension: Secondary | ICD-10-CM | POA: Diagnosis present

## 2013-04-16 DIAGNOSIS — G4700 Insomnia, unspecified: Secondary | ICD-10-CM

## 2013-04-16 HISTORY — PX: ANTERIOR CERVICAL DECOMP/DISCECTOMY FUSION: SHX1161

## 2013-04-16 LAB — GLUCOSE, CAPILLARY: Glucose-Capillary: 120 mg/dL — ABNORMAL HIGH (ref 70–99)

## 2013-04-16 SURGERY — ANTERIOR CERVICAL DECOMPRESSION/DISCECTOMY FUSION 1 LEVEL
Anesthesia: General | Wound class: Clean

## 2013-04-16 MED ORDER — ACETAMINOPHEN 10 MG/ML IV SOLN
INTRAVENOUS | Status: AC
  Administered 2013-04-16: 1000 mg via INTRAVENOUS
  Filled 2013-04-16: qty 100

## 2013-04-16 MED ORDER — PHENYLEPHRINE HCL 10 MG/ML IJ SOLN
INTRAMUSCULAR | Status: DC | PRN
  Administered 2013-04-16 (×2): 80 ug via INTRAVENOUS

## 2013-04-16 MED ORDER — VECURONIUM BROMIDE 10 MG IV SOLR
INTRAVENOUS | Status: DC | PRN
  Administered 2013-04-16: 2 mg via INTRAVENOUS
  Administered 2013-04-16 (×5): 1 mg via INTRAVENOUS
  Administered 2013-04-16: 2 mg via INTRAVENOUS
  Administered 2013-04-16: 1 mg via INTRAVENOUS

## 2013-04-16 MED ORDER — ZOLPIDEM TARTRATE 5 MG PO TABS: 5.0000 mg | ORAL_TABLET | Freq: Every evening | ORAL | Status: DC | PRN

## 2013-04-16 MED ORDER — LIDOCAINE HCL (CARDIAC) 20 MG/ML IV SOLN
INTRAVENOUS | Status: DC | PRN
  Administered 2013-04-16: 100 mg via INTRAVENOUS

## 2013-04-16 MED ORDER — LISINOPRIL 20 MG PO TABS
20.0000 mg | ORAL_TABLET | Freq: Every morning | ORAL | Status: DC
  Administered 2013-04-17 – 2013-04-19 (×3): 20 mg via ORAL
  Filled 2013-04-16 (×3): qty 1

## 2013-04-16 MED ORDER — DOCUSATE SODIUM 100 MG PO CAPS
100.0000 mg | ORAL_CAPSULE | Freq: Two times a day (BID) | ORAL | Status: DC
  Administered 2013-04-16 – 2013-04-19 (×6): 100 mg via ORAL
  Filled 2013-04-16 (×6): qty 1

## 2013-04-16 MED ORDER — HEMOSTATIC AGENTS (NO CHARGE) OPTIME
TOPICAL | Status: DC | PRN
  Administered 2013-04-16: 1 via TOPICAL

## 2013-04-16 MED ORDER — PROPOFOL 10 MG/ML IV BOLUS
INTRAVENOUS | Status: DC | PRN
  Administered 2013-04-16: 160 mg via INTRAVENOUS

## 2013-04-16 MED ORDER — OXYCODONE HCL 5 MG PO TABS
5.0000 mg | ORAL_TABLET | Freq: Once | ORAL | Status: AC | PRN
  Administered 2013-04-16: 5 mg via ORAL

## 2013-04-16 MED ORDER — DIPHENHYDRAMINE HCL 12.5 MG/5ML PO ELIX: 12.5000 mg | ORAL_SOLUTION | Freq: Four times a day (QID) | ORAL | Status: DC | PRN

## 2013-04-16 MED ORDER — ACETAMINOPHEN 10 MG/ML IV SOLN
1000.0000 mg | Freq: Four times a day (QID) | INTRAVENOUS | Status: AC
  Administered 2013-04-16 – 2013-04-17 (×4): 1000 mg via INTRAVENOUS
  Filled 2013-04-16 (×3): qty 100

## 2013-04-16 MED ORDER — MORPHINE SULFATE (PF) 1 MG/ML IV SOLN
INTRAVENOUS | Status: AC
  Administered 2013-04-16: 17:00:00 via INTRAVENOUS
  Filled 2013-04-16: qty 25

## 2013-04-16 MED ORDER — PHENOL 1.4 % MT LIQD: 1.0000 | OROMUCOSAL | Status: DC | PRN

## 2013-04-16 MED ORDER — MORPHINE SULFATE (PF) 1 MG/ML IV SOLN
INTRAVENOUS | Status: DC
  Administered 2013-04-16: 17:00:00 via INTRAVENOUS
  Administered 2013-04-16: 19 mg via INTRAVENOUS
  Administered 2013-04-16: 22:00:00 via INTRAVENOUS
  Administered 2013-04-17: 12 mg via INTRAVENOUS
  Administered 2013-04-17: 02:00:00 via INTRAVENOUS
  Administered 2013-04-17 (×2): 20 mg via INTRAVENOUS
  Filled 2013-04-16 (×2): qty 25

## 2013-04-16 MED ORDER — NEOSTIGMINE METHYLSULFATE 1 MG/ML IJ SOLN
INTRAMUSCULAR | Status: DC | PRN
  Administered 2013-04-16: 3 mg via INTRAVENOUS

## 2013-04-16 MED ORDER — ACETAMINOPHEN 10 MG/ML IV SOLN
1000.0000 mg | Freq: Four times a day (QID) | INTRAVENOUS | Status: DC
  Administered 2013-04-16: 1000 mg via INTRAVENOUS

## 2013-04-16 MED ORDER — SODIUM CHLORIDE 0.9 % IJ SOLN
3.0000 mL | Freq: Two times a day (BID) | INTRAMUSCULAR | Status: DC
  Administered 2013-04-17 – 2013-04-18 (×3): 3 mL via INTRAVENOUS

## 2013-04-16 MED ORDER — FLEET ENEMA 7-19 GM/118ML RE ENEM: 1.0000 | ENEMA | Freq: Once | RECTAL | Status: AC | PRN

## 2013-04-16 MED ORDER — NALOXONE HCL 0.4 MG/ML IJ SOLN: 0.4000 mg | INTRAMUSCULAR | Status: DC | PRN

## 2013-04-16 MED ORDER — DEXAMETHASONE 4 MG PO TABS
4.0000 mg | ORAL_TABLET | Freq: Four times a day (QID) | ORAL | Status: DC
  Administered 2013-04-16 – 2013-04-19 (×11): 4 mg via ORAL
  Filled 2013-04-16 (×15): qty 1

## 2013-04-16 MED ORDER — AMLODIPINE BESYLATE 10 MG PO TABS
10.0000 mg | ORAL_TABLET | Freq: Every morning | ORAL | Status: DC
  Administered 2013-04-17 – 2013-04-19 (×3): 10 mg via ORAL
  Filled 2013-04-16 (×3): qty 1

## 2013-04-16 MED ORDER — ONDANSETRON HCL 4 MG/2ML IJ SOLN: 4.0000 mg | INTRAMUSCULAR | Status: DC | PRN

## 2013-04-16 MED ORDER — SODIUM CHLORIDE 0.9 % IJ SOLN: 3.0000 mL | INTRAMUSCULAR | Status: DC | PRN

## 2013-04-16 MED ORDER — CEFAZOLIN SODIUM 1-5 GM-% IV SOLN
1.0000 g | Freq: Three times a day (TID) | INTRAVENOUS | Status: AC
  Administered 2013-04-16 (×2): 1 g via INTRAVENOUS
  Filled 2013-04-16 (×3): qty 50

## 2013-04-16 MED ORDER — OXYCODONE HCL 5 MG PO TABS
10.0000 mg | ORAL_TABLET | ORAL | Status: DC | PRN
  Administered 2013-04-17 – 2013-04-18 (×5): 10 mg via ORAL
  Filled 2013-04-16 (×5): qty 2

## 2013-04-16 MED ORDER — METHOCARBAMOL 500 MG PO TABS
500.0000 mg | ORAL_TABLET | Freq: Four times a day (QID) | ORAL | Status: DC | PRN
  Administered 2013-04-16 – 2013-04-18 (×4): 500 mg via ORAL
  Filled 2013-04-16 (×3): qty 1

## 2013-04-16 MED ORDER — METFORMIN HCL 500 MG PO TABS
1000.0000 mg | ORAL_TABLET | Freq: Two times a day (BID) | ORAL | Status: DC
  Administered 2013-04-17 – 2013-04-19 (×4): 1000 mg via ORAL
  Filled 2013-04-16 (×7): qty 2

## 2013-04-16 MED ORDER — FENTANYL CITRATE 0.05 MG/ML IJ SOLN
INTRAMUSCULAR | Status: DC | PRN
  Administered 2013-04-16: 50 ug via INTRAVENOUS
  Administered 2013-04-16: 100 ug via INTRAVENOUS
  Administered 2013-04-16: 50 ug via INTRAVENOUS
  Administered 2013-04-16: 25 ug via INTRAVENOUS
  Administered 2013-04-16: 50 ug via INTRAVENOUS
  Administered 2013-04-16: 25 ug via INTRAVENOUS
  Administered 2013-04-16 (×4): 50 ug via INTRAVENOUS

## 2013-04-16 MED ORDER — INSULIN ASPART 100 UNIT/ML ~~LOC~~ SOLN
0.0000 [IU] | SUBCUTANEOUS | Status: DC
  Administered 2013-04-17 (×3): 3 [IU] via SUBCUTANEOUS
  Administered 2013-04-17: 5 [IU] via SUBCUTANEOUS
  Administered 2013-04-17 (×2): 3 [IU] via SUBCUTANEOUS
  Administered 2013-04-18: 2 [IU] via SUBCUTANEOUS
  Administered 2013-04-18 (×2): 3 [IU] via SUBCUTANEOUS
  Administered 2013-04-18: 5 [IU] via SUBCUTANEOUS
  Administered 2013-04-18 (×2): 2 [IU] via SUBCUTANEOUS
  Administered 2013-04-19 (×3): 3 [IU] via SUBCUTANEOUS

## 2013-04-16 MED ORDER — PHENYLEPHRINE HCL 10 MG/ML IJ SOLN
10.0000 mg | INTRAVENOUS | Status: DC | PRN
  Administered 2013-04-16: 25 ug/min via INTRAVENOUS

## 2013-04-16 MED ORDER — LACTATED RINGERS IV SOLN
INTRAVENOUS | Status: DC
  Administered 2013-04-16: 23:00:00 via INTRAVENOUS

## 2013-04-16 MED ORDER — METOCLOPRAMIDE HCL 5 MG/ML IJ SOLN: 10.0000 mg | Freq: Once | INTRAMUSCULAR | Status: DC | PRN

## 2013-04-16 MED ORDER — DEXAMETHASONE SODIUM PHOSPHATE 4 MG/ML IJ SOLN
4.0000 mg | Freq: Four times a day (QID) | INTRAMUSCULAR | Status: DC
  Filled 2013-04-16 (×15): qty 1

## 2013-04-16 MED ORDER — TAMSULOSIN HCL 0.4 MG PO CAPS
0.4000 mg | ORAL_CAPSULE | Freq: Every day | ORAL | Status: DC
  Administered 2013-04-16 – 2013-04-19 (×4): 0.4 mg via ORAL
  Filled 2013-04-16 (×4): qty 1

## 2013-04-16 MED ORDER — VANCOMYCIN HCL 500 MG IV SOLR
INTRAVENOUS | Status: AC
  Filled 2013-04-16: qty 500

## 2013-04-16 MED ORDER — EPHEDRINE SULFATE 50 MG/ML IJ SOLN
INTRAMUSCULAR | Status: DC | PRN
  Administered 2013-04-16 (×3): 10 mg via INTRAVENOUS

## 2013-04-16 MED ORDER — ROCURONIUM BROMIDE 100 MG/10ML IV SOLN
INTRAVENOUS | Status: DC | PRN
  Administered 2013-04-16: 50 mg via INTRAVENOUS

## 2013-04-16 MED ORDER — THROMBIN 20000 UNITS EX SOLR
CUTANEOUS | Status: AC
  Filled 2013-04-16: qty 20000

## 2013-04-16 MED ORDER — MIDAZOLAM HCL 2 MG/2ML IJ SOLN
INTRAMUSCULAR | Status: AC
  Administered 2013-04-16: 1 mg via INTRAVENOUS
  Filled 2013-04-16: qty 2

## 2013-04-16 MED ORDER — OXYCODONE HCL 5 MG PO TABS
ORAL_TABLET | ORAL | Status: AC
  Administered 2013-04-16: 5 mg via ORAL
  Filled 2013-04-16: qty 1

## 2013-04-16 MED ORDER — TEMAZEPAM 15 MG PO CAPS: 30.0000 mg | ORAL_CAPSULE | Freq: Every evening | ORAL | Status: DC | PRN

## 2013-04-16 MED ORDER — MENTHOL 3 MG MT LOZG: 1.0000 | LOZENGE | OROMUCOSAL | Status: DC | PRN

## 2013-04-16 MED ORDER — GLYCOPYRROLATE 0.2 MG/ML IJ SOLN
INTRAMUSCULAR | Status: DC | PRN
  Administered 2013-04-16: 0.4 mg via INTRAVENOUS

## 2013-04-16 MED ORDER — DEXTROSE 5 % IV SOLN
500.0000 mg | Freq: Four times a day (QID) | INTRAVENOUS | Status: DC | PRN
  Filled 2013-04-16: qty 5

## 2013-04-16 MED ORDER — DULOXETINE HCL 60 MG PO CPEP
60.0000 mg | ORAL_CAPSULE | Freq: Every day | ORAL | Status: DC
  Administered 2013-04-16 – 2013-04-18 (×3): 60 mg via ORAL
  Filled 2013-04-16 (×4): qty 1

## 2013-04-16 MED ORDER — LACTATED RINGERS IV SOLN
INTRAVENOUS | Status: DC | PRN
  Administered 2013-04-16 (×3): via INTRAVENOUS

## 2013-04-16 MED ORDER — ALBUMIN HUMAN 5 % IV SOLN
INTRAVENOUS | Status: DC | PRN
  Administered 2013-04-16 (×3): via INTRAVENOUS

## 2013-04-16 MED ORDER — HYDROMORPHONE HCL PF 1 MG/ML IJ SOLN
INTRAMUSCULAR | Status: AC
  Administered 2013-04-16: 0.5 mg via INTRAVENOUS
  Filled 2013-04-16: qty 1

## 2013-04-16 MED ORDER — BUPIVACAINE-EPINEPHRINE PF 0.25-1:200000 % IJ SOLN
INTRAMUSCULAR | Status: AC
  Filled 2013-04-16: qty 30

## 2013-04-16 MED ORDER — FERROUS SULFATE 325 (65 FE) MG PO TABS
325.0000 mg | ORAL_TABLET | Freq: Two times a day (BID) | ORAL | Status: DC
  Administered 2013-04-16 – 2013-04-19 (×6): 325 mg via ORAL
  Filled 2013-04-16 (×7): qty 1

## 2013-04-16 MED ORDER — OXYCODONE HCL 5 MG/5ML PO SOLN: 5.0000 mg | Freq: Once | ORAL | Status: AC | PRN

## 2013-04-16 MED ORDER — MUPIROCIN 2 % EX OINT
TOPICAL_OINTMENT | CUTANEOUS | Status: AC
  Administered 2013-04-16: 1 via NASAL
  Filled 2013-04-16: qty 22

## 2013-04-16 MED ORDER — BUPIVACAINE-EPINEPHRINE 0.25% -1:200000 IJ SOLN
INTRAMUSCULAR | Status: DC | PRN
  Administered 2013-04-16: 10 mL

## 2013-04-16 MED ORDER — CEFAZOLIN SODIUM 1-5 GM-% IV SOLN
INTRAVENOUS | Status: AC
  Filled 2013-04-16: qty 100

## 2013-04-16 MED ORDER — MUPIROCIN CALCIUM 2 % EX CREA
TOPICAL_CREAM | Freq: Two times a day (BID) | CUTANEOUS | Status: DC
Start: 2013-04-16 — End: 2013-04-19
  Administered 2013-04-16 – 2013-04-17 (×2): via TOPICAL
  Administered 2013-04-17: 1 via TOPICAL
  Administered 2013-04-18 (×2): via TOPICAL
  Filled 2013-04-16: qty 15

## 2013-04-16 MED ORDER — SODIUM CHLORIDE 0.9 % IV SOLN: 250.0000 mL | INTRAVENOUS | Status: DC

## 2013-04-16 MED ORDER — MIDAZOLAM HCL 5 MG/5ML IJ SOLN
INTRAMUSCULAR | Status: DC | PRN
  Administered 2013-04-16: 1 mg via INTRAVENOUS

## 2013-04-16 MED ORDER — ONDANSETRON HCL 4 MG/2ML IJ SOLN
INTRAMUSCULAR | Status: DC | PRN
  Administered 2013-04-16: 4 mg via INTRAVENOUS

## 2013-04-16 MED ORDER — HYDROMORPHONE HCL PF 1 MG/ML IJ SOLN
0.2500 mg | INTRAMUSCULAR | Status: DC | PRN
  Administered 2013-04-16 (×4): 0.5 mg via INTRAVENOUS

## 2013-04-16 MED ORDER — PREGABALIN 50 MG PO CAPS
75.0000 mg | ORAL_CAPSULE | Freq: Two times a day (BID) | ORAL | Status: DC
  Administered 2013-04-16 – 2013-04-17 (×3): 75 mg via ORAL
  Filled 2013-04-16 (×3): qty 1

## 2013-04-16 MED ORDER — ACETAMINOPHEN 10 MG/ML IV SOLN
INTRAVENOUS | Status: AC
  Filled 2013-04-16: qty 100

## 2013-04-16 MED ORDER — LEFLUNOMIDE 20 MG PO TABS
20.0000 mg | ORAL_TABLET | Freq: Every morning | ORAL | Status: DC
  Administered 2013-04-17 – 2013-04-19 (×3): 20 mg via ORAL
  Filled 2013-04-16 (×3): qty 1

## 2013-04-16 MED ORDER — MIDAZOLAM HCL 2 MG/2ML IJ SOLN
1.0000 mg | INTRAMUSCULAR | Status: DC | PRN
  Administered 2013-04-16: 1 mg via INTRAVENOUS

## 2013-04-16 MED ORDER — DIPHENHYDRAMINE HCL 50 MG/ML IJ SOLN: 12.5000 mg | Freq: Four times a day (QID) | INTRAMUSCULAR | Status: DC | PRN

## 2013-04-16 MED ORDER — METHOCARBAMOL 500 MG PO TABS
ORAL_TABLET | ORAL | Status: AC
  Administered 2013-04-16: 500 mg via ORAL
  Filled 2013-04-16: qty 1

## 2013-04-16 MED ORDER — ARTIFICIAL TEARS OP OINT
TOPICAL_OINTMENT | OPHTHALMIC | Status: DC | PRN
  Administered 2013-04-16: 1 via OPHTHALMIC

## 2013-04-16 MED ORDER — SODIUM CHLORIDE 0.9 % IJ SOLN: 9.0000 mL | INTRAMUSCULAR | Status: DC | PRN

## 2013-04-16 MED ORDER — THROMBIN 20000 UNITS EX KIT
PACK | CUTANEOUS | Status: DC | PRN
  Administered 2013-04-16: 10:00:00 via TOPICAL

## 2013-04-16 MED ORDER — ONDANSETRON HCL 4 MG/2ML IJ SOLN: 4.0000 mg | Freq: Four times a day (QID) | INTRAMUSCULAR | Status: DC | PRN

## 2013-04-16 MED ORDER — VANCOMYCIN HCL 500 MG IV SOLR
INTRAVENOUS | Status: DC | PRN
  Administered 2013-04-16: 500 mg

## 2013-04-16 SURGICAL SUPPLY — 63 items
BLADE SURG 10 STRL SS (BLADE) ×1 IMPLANT
BLADE SURG ROTATE 9660 (MISCELLANEOUS) IMPLANT
BUR EGG ELITE 4.0 (BURR) ×1 IMPLANT
BUR MATCHSTICK NEURO 3.0 LAGG (BURR) IMPLANT
CANISTER SUCTION 2500CC (MISCELLANEOUS) ×2 IMPLANT
CLOTH BEACON ORANGE TIMEOUT ST (SAFETY) ×2 IMPLANT
CLSR STERI-STRIP ANTIMIC 1/2X4 (GAUZE/BANDAGES/DRESSINGS) ×2 IMPLANT
CORDS BIPOLAR (ELECTRODE) ×3 IMPLANT
COVER SURGICAL LIGHT HANDLE (MISCELLANEOUS) ×4 IMPLANT
DRAPE C-ARM 42X72 X-RAY (DRAPES) ×3 IMPLANT
DRAPE ORTHO SPLIT 77X108 STRL (DRAPES) ×2
DRAPE POUCH INSTRU U-SHP 10X18 (DRAPES) ×2 IMPLANT
DRAPE SURG 17X23 STRL (DRAPES) ×3 IMPLANT
DRAPE SURG ORHT 6 SPLT 77X108 (DRAPES) IMPLANT
DRAPE U-SHAPE 47X51 STRL (DRAPES) ×2 IMPLANT
DRSG MEPILEX BORDER 4X4 (GAUZE/BANDAGES/DRESSINGS) ×2 IMPLANT
DRSG MEPILEX BORDER 4X8 (GAUZE/BANDAGES/DRESSINGS) ×1 IMPLANT
DURAPREP 26ML APPLICATOR (WOUND CARE) ×2 IMPLANT
ELECT BLADE 4.0 EZ CLEAN MEGAD (MISCELLANEOUS) ×2
ELECT REM PT RETURN 9FT ADLT (ELECTROSURGICAL) ×2
ELECTRODE BLDE 4.0 EZ CLN MEGD (MISCELLANEOUS) IMPLANT
ELECTRODE REM PT RTRN 9FT ADLT (ELECTROSURGICAL) ×1 IMPLANT
GLOVE BIO SURGEON STRL SZ 6.5 (GLOVE) ×1 IMPLANT
GLOVE BIO SURGEON STRL SZ7 (GLOVE) ×2 IMPLANT
GLOVE BIOGEL PI IND STRL 7.0 (GLOVE) IMPLANT
GLOVE BIOGEL PI IND STRL 8.5 (GLOVE) ×1 IMPLANT
GLOVE BIOGEL PI INDICATOR 7.0 (GLOVE) ×4
GLOVE BIOGEL PI INDICATOR 8.5 (GLOVE) ×1
GLOVE ECLIPSE 8.5 STRL (GLOVE) ×2 IMPLANT
GLOVE SURG SS PI 6.5 STRL IVOR (GLOVE) ×1 IMPLANT
GOWN PREVENTION PLUS XXLARGE (GOWN DISPOSABLE) ×3 IMPLANT
GOWN STRL REIN XL XLG (GOWN DISPOSABLE) ×4 IMPLANT
KIT BASIN OR (CUSTOM PROCEDURE TRAY) ×2 IMPLANT
KIT POSITION SURG JACKSON T1 (MISCELLANEOUS) ×1 IMPLANT
KIT ROOM TURNOVER OR (KITS) ×2 IMPLANT
KIT STIMULAN RAPID CURE 5CC (Orthopedic Implant) ×1 IMPLANT
NDL SPNL 18GX3.5 QUINCKE PK (NEEDLE) ×1 IMPLANT
NEEDLE SPNL 18GX3.5 QUINCKE PK (NEEDLE) ×4 IMPLANT
NS IRRIG 1000ML POUR BTL (IV SOLUTION) ×2 IMPLANT
PACK LAMINECTOMY ORTHO (CUSTOM PROCEDURE TRAY) ×1 IMPLANT
PACK UNIVERSAL I (CUSTOM PROCEDURE TRAY) ×2 IMPLANT
PAD ARMBOARD 7.5X6 YLW CONV (MISCELLANEOUS) ×4 IMPLANT
PATTIES SURGICAL .5 X1 (DISPOSABLE) ×1 IMPLANT
PENCIL BUTTON HOLSTER BLD 10FT (ELECTRODE) ×1 IMPLANT
ROD PRE BENT EXP 40MM (Rod) ×2 IMPLANT
SCREW EXPEDIUM POLYAXIAL 6X45M (Screw) ×2 IMPLANT
SCREW POLYAXIAL 7X45MM (Screw) ×2 IMPLANT
SCREW SET SINGLE INNER (Screw) ×4 IMPLANT
SHEET CONFORM 45LX20WX5H (Bone Implant) ×2 IMPLANT
SLEEVE SURGEON STRL (DRAPES) ×1 IMPLANT
SPONGE LAP 4X18 X RAY DECT (DISPOSABLE) ×5 IMPLANT
SPONGE SURGIFOAM ABS GEL 100 (HEMOSTASIS) ×2 IMPLANT
SURGIFLO TRUKIT (HEMOSTASIS) ×1 IMPLANT
SUT MNCRL AB 3-0 PS2 18 (SUTURE) ×4 IMPLANT
SUT VIC AB 1 CTX 18 (SUTURE) ×1 IMPLANT
SUT VIC AB 2-0 CT1 18 (SUTURE) ×3 IMPLANT
SYR BULB IRRIGATION 50ML (SYRINGE) ×2 IMPLANT
SYR CONTROL 10ML LL (SYRINGE) ×2 IMPLANT
TOWEL OR 17X24 6PK STRL BLUE (TOWEL DISPOSABLE) ×3 IMPLANT
TOWEL OR 17X26 10 PK STRL BLUE (TOWEL DISPOSABLE) ×3 IMPLANT
TRAY FOLEY CATH 16FRSI W/METER (SET/KITS/TRAYS/PACK) ×1 IMPLANT
WATER STERILE IRR 1000ML POUR (IV SOLUTION) ×2 IMPLANT
YANKAUER SUCT BULB TIP NO VENT (SUCTIONS) ×1 IMPLANT

## 2013-04-16 NOTE — H&P (Signed)
No change in clinical exam H+P reviewed  

## 2013-04-16 NOTE — Brief Op Note (Signed)
04/16/2013  3:02 PM  PATIENT:  Rolene Arbour  77 y.o. 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  POST-OPERATIVE DIAGNOSIS:  L4 - L5 Recurrent Stenosis Slip Painful Defect  PROCEDURE:  Procedure(s): L4 - L5 REVISION DECOMPRESSION AND FUSION 1 LEVEL (N/A)  SURGEON:  Surgeon(s) and Role:    * Venita Lick, MD - Primary  PHYSICIAN ASSISTANT:   ASSISTANTS: Zonia Kief   ANESTHESIA:   general  EBL:  Total I/O In: 3150 [I.V.:2400; IV Piggyback:750] Out: 970 [Urine:270; Blood:700]  BLOOD ADMINISTERED:none  DRAINS: none   LOCAL MEDICATIONS USED:  MARCAINE     SPECIMEN:  No Specimen  DISPOSITION OF SPECIMEN:  N/A  COUNTS:  YES  TOURNIQUET:  * No tourniquets in log *  DICTATION: .Other Dictation: Dictation Number [id number]  PLAN OF CARE: Admit to inpatient   PATIENT DISPOSITION:  PACU - hemodynamically stable.

## 2013-04-16 NOTE — Anesthesia Procedure Notes (Signed)
Procedure Name: Intubation Date/Time: 04/16/2013 8:45 AM Performed by: Gayla Medicus Pre-anesthesia Checklist: Patient identified, Timeout performed, Emergency Drugs available, Suction available and Patient being monitored Patient Re-evaluated:Patient Re-evaluated prior to inductionOxygen Delivery Method: Circle system utilized Preoxygenation: Pre-oxygenation with 100% oxygen Intubation Type: IV induction Ventilation: Mask ventilation without difficulty and Oral airway inserted - appropriate to patient size Laryngoscope Size: Mac and 4 Grade View: Grade I Tube type: Oral Tube size: 7.5 mm Number of attempts: 1 Airway Equipment and Method: Stylet and Video-laryngoscopy Placement Confirmation: ETT inserted through vocal cords under direct vision,  positive ETCO2 and breath sounds checked- equal and bilateral Secured at: 24 cm Tube secured with: Tape Dental Injury: Teeth and Oropharynx as per pre-operative assessment

## 2013-04-16 NOTE — Anesthesia Postprocedure Evaluation (Signed)
  Anesthesia Post-op Note  Patient: [name]  Procedure(s) Performed: Procedure(s): L4 - L5 REVISION DECOMPRESSION AND FUSION 1 LEVEL (N/A)  Patient Location: PACU  Anesthesia Type:General  Level of Consciousness: awake, alert , oriented and patient cooperative  Airway and Oxygen Therapy: Patient Spontanous Breathing and Patient connected to nasal cannula oxygen  Post-op Pain: mild  Post-op Assessment: Post-op Vital signs reviewed, Patient's Cardiovascular Status Stable, Respiratory Function Stable, Patent Airway, No signs of Nausea or vomiting and Pain level controlled/improved  Post-op Vital Signs: Reviewed and stable  Complications: No apparent anesthesia complications

## 2013-04-16 NOTE — Transfer of Care (Signed)
Immediate Anesthesia Transfer of Care Note  Patient: [name]  Procedure(s) Performed: Procedure(s): L4 - L5 REVISION DECOMPRESSION AND FUSION 1 LEVEL (N/A)  Patient Location: PACU  Anesthesia Type:General  Level of Consciousness: awake, alert  and oriented  Airway & Oxygen Therapy: Patient Spontanous Breathing and Patient connected to nasal cannula oxygen  Post-op Assessment: Report given to PACU RN, Post -op Vital signs reviewed and stable and Patient moving all extremities X 4  Post vital signs: Reviewed and stable  Complications: No apparent anesthesia complications

## 2013-04-16 NOTE — Preoperative (Signed)
Beta Blockers   Reason not to administer Beta Blockers:Not Applicable 

## 2013-04-16 NOTE — Anesthesia Preprocedure Evaluation (Signed)
Anesthesia Evaluation  Patient identified by MRN, date of birth, ID band Patient awake    Reviewed: Allergy & Precautions, H&P , NPO status , Patient's Chart, lab work & pertinent test results, reviewed documented beta blocker date and time   Airway Mallampati: II TM Distance: >3 FB Neck ROM: full    Dental   Pulmonary neg pulmonary ROS,  breath sounds clear to auscultation        Cardiovascular hypertension, On Medications + CAD + dysrhythmias + Valvular Problems/Murmurs Rhythm:regular     Neuro/Psych  Neuromuscular disease negative psych ROS   GI/Hepatic negative GI ROS, Neg liver ROS,   Endo/Other  diabetes, Oral Hypoglycemic Agents  Renal/GU negative Renal ROS  negative genitourinary   Musculoskeletal   Abdominal   Peds  Hematology negative hematology ROS (+) anemia ,   Anesthesia Other Findings See surgeon's H&P   Reproductive/Obstetrics negative OB ROS                           Anesthesia Physical Anesthesia Plan  ASA: III  Anesthesia Plan: General   Post-op Pain Management:    Induction: Intravenous  Airway Management Planned: Oral ETT and Video Laryngoscope Planned  Additional Equipment: Arterial line  Intra-op Plan:   Post-operative Plan: Extubation in OR  Informed Consent: I have reviewed the patients History and Physical, chart, labs and discussed the procedure including the risks, benefits and alternatives for the proposed anesthesia with the patient or authorized representative who has indicated his/her understanding and acceptance.   Dental Advisory Given  Plan Discussed with: CRNA and Surgeon  Anesthesia Plan Comments:         Anesthesia Quick Evaluation

## 2013-04-17 ENCOUNTER — Encounter (HOSPITAL_COMMUNITY): Payer: Self-pay | Admitting: Orthopedic Surgery

## 2013-04-17 ENCOUNTER — Inpatient Hospital Stay (HOSPITAL_COMMUNITY): Payer: Medicare Other

## 2013-04-17 LAB — CBC
Hemoglobin: 10.9 g/dL — ABNORMAL LOW (ref 13.0–17.0)
MCH: 29.4 pg (ref 26.0–34.0)
MCV: 87.3 fL (ref 78.0–100.0)
RBC: 3.71 MIL/uL — ABNORMAL LOW (ref 4.22–5.81)

## 2013-04-17 LAB — BASIC METABOLIC PANEL
CO2: 25 mEq/L (ref 19–32)
Chloride: 100 mEq/L (ref 96–112)
Glucose, Bld: 248 mg/dL — ABNORMAL HIGH (ref 70–99)
Potassium: 4.1 mEq/L (ref 3.5–5.1)
Sodium: 134 mEq/L — ABNORMAL LOW (ref 135–145)

## 2013-04-17 LAB — GLUCOSE, CAPILLARY
Glucose-Capillary: 249 mg/dL — ABNORMAL HIGH (ref 70–99)
Glucose-Capillary: 193 mg/dL — ABNORMAL HIGH (ref 70–99)
Glucose-Capillary: 174 mg/dL — ABNORMAL HIGH (ref 70–99)

## 2013-04-17 MED ORDER — TEMAZEPAM 15 MG PO CAPS
30.0000 mg | ORAL_CAPSULE | Freq: Every evening | ORAL | Status: DC | PRN
Start: 2013-04-17 — End: 2013-04-19
  Administered 2013-04-17 – 2013-04-19 (×3): 30 mg via ORAL
  Filled 2013-04-17: qty 2
  Filled 2013-04-17 (×2): qty 1
  Filled 2013-04-17: qty 2

## 2013-04-17 MED ORDER — TEMAZEPAM 30 MG PO CAPS: 15.0000 mg | ORAL_CAPSULE | Freq: Every day | ORAL | Status: DC

## 2013-04-17 MED ORDER — METHOCARBAMOL 500 MG PO TABS: 500.0000 mg | ORAL_TABLET | Freq: Four times a day (QID) | ORAL | Status: DC | PRN

## 2013-04-17 MED ORDER — MORPHINE SULFATE 2 MG/ML IJ SOLN
2.0000 mg | Freq: Once | INTRAMUSCULAR | Status: AC
  Administered 2013-04-17: 2 mg via INTRAVENOUS

## 2013-04-17 MED ORDER — OXYCODONE-ACETAMINOPHEN 10-325 MG PO TABS: 1.0000 | ORAL_TABLET | ORAL | Status: DC | PRN

## 2013-04-17 MED ORDER — MORPHINE SULFATE 2 MG/ML IJ SOLN
2.0000 mg | INTRAMUSCULAR | Status: DC | PRN
  Administered 2013-04-17: 2 mg via INTRAVENOUS
  Filled 2013-04-17: qty 1

## 2013-04-17 MED ORDER — MORPHINE SULFATE 2 MG/ML IJ SOLN
INTRAMUSCULAR | Status: AC
  Filled 2013-04-17: qty 1

## 2013-04-17 NOTE — Progress Notes (Signed)
Subjective: Doing well.  C/o some back pain after going to xray this morning.  Some pain at left pes bursa, but otherwise doing fine.   Objective: Vital signs in last 24 hours: Temp:  [97.8 F (36.6 C)-99.4 F (37.4 C)] 98.8 F (37.1 C) (09/04 0629) Pulse Rate:  [55-74] 63 (09/04 0629) Resp:  [8-16] 16 (09/04 0629) BP: (118-143)/(51-70) 123/62 mmHg (09/04 0142) SpO2:  [96 %-100 %] 97 % (09/04 0629)  Intake/Output from previous day: 09/03 0701 - 09/04 0700 In: 3650 [P.O.:100; I.V.:2800; IV Piggyback:750] Out: 2200 [Urine:1500; Blood:700] Intake/Output this shift:     Recent Labs  04/15/13 1222  HGB 13.8    Recent Labs  04/15/13 1222  WBC 6.0  RBC 4.74  HCT 41.5  PLT 209    Recent Labs  04/15/13 1222  NA 143  K 4.4  CL 105  CO2 28  BUN 18  CREATININE 0.93  GLUCOSE 132*  CALCIUM 9.8   No results found for this basename: LABPT, INR,  in the last 72 hours  Exam: alert and oriented x 3.  Mild to mod ttp at left per bursa.  Nvi.  bilat ant tib, ehl and gastroc are strong.    Assessment/Plan: PT today.  Wife will bring up braces that he has.  Anticipate d/c home Friday or Saturday.  Will put ice over left knee.    [name] M 04/17/2013, 8:24 AM

## 2013-04-17 NOTE — Evaluation (Signed)
Physical Therapy Evaluation Patient Details Name: [name] MRN: [id number] DOB: 03/11/1936 Today's Date: 04/17/2013 Time: 4098-1191 PT Time Calculation (min): 25 min  PT Assessment / Plan / Recommendation History of Present Illness  L4 - L5 REVISION DECOMPRESSION AND FUSION 1 LEVEL  Clinical Impression  Pt. Presents to PT with decreased independence in functional mobility and gait and will need acute PT to address these and below issues.  PT had saturated his operative back dressing, his gown and his bed sheet with serosanguinous drainage.  I discussed this with his RN Marylene Land and asked her to check on this situation and likely need to replace his dressing.  She plans to do so.    PT Assessment  Patient needs continued PT services    Follow Up Recommendations  Home health PT;Supervision/Assistance - 24 hour;Other (comment) (depending on his progress while here)    Does the patient have the potential to tolerate intense rehabilitation      Barriers to Discharge        Equipment Recommendations  None recommended by PT    Recommendations for Other Services     Frequency Min 6X/week    Precautions / Restrictions Precautions Precautions: Back Precaution Booklet Issued: Yes (comment) Precaution Comments: Reviewed 3/3 back precautions; pt. states "I will do this the way it works for me" Restrictions Weight Bearing Restrictions: No   Pertinent Vitals/Pain See vitals tab       Mobility  Bed Mobility Bed Mobility: Rolling Right;Right Sidelying to Sit;Sitting - Scoot to Edge of Bed Rolling Right: 5: Supervision Right Sidelying to Sit: 5: Supervision;HOB flat Sitting - Scoot to Edge of Bed: 5: Supervision Details for Bed Mobility Assistance: Supervision to ensure proper log roll technique. No physical assist required. Incr time needed. Transfers Transfers: Sit to Stand;Stand to Sit Sit to Stand: 4: Min assist;From bed;From chair/3-in-1;With upper extremity assist;With  armrests Stand to Sit: 4: Min guard;To chair/3-in-1;With armrests;With upper extremity assist Details for Transfer Assistance: assist to rise to stand and for transitioning hands from bed to RW Ambulation/Gait Ambulation/Gait Assistance: 4: Min assist Ambulation Distance (Feet): 20 Feet Assistive device: Rolling walker Ambulation/Gait Assistance Details: cues for erect stance and min assist for safety and stability Gait Pattern: Step-through pattern Gait velocity: decreased Stairs: No    Exercises     PT Diagnosis: Difficulty walking;Acute pain  PT Problem List: Decreased activity tolerance;Decreased mobility;Decreased knowledge of use of DME;Decreased knowledge of precautions;Pain PT Treatment Interventions: DME instruction;Gait training;Functional mobility training;Therapeutic activities;Patient/family education     PT Goals(Current goals can be found in the care plan section) Acute Rehab PT Goals Patient Stated Goal: to return to being active PT Goal Formulation: With patient Time For Goal Achievement: 04/24/13 Potential to Achieve Goals: Good  Visit Information  Last PT Received On: 04/17/13 Assistance Needed: +1 History of Present Illness: L4 - L5 REVISION DECOMPRESSION AND FUSION 1 LEVEL       Prior Functioning  Home Living Family/patient expects to be discharged to:: Private residence Living Arrangements: Spouse/significant other Available Help at Discharge: Family Type of Home: House Home Access: Level entry Home Layout: Multi-level;Able to live on main level with bedroom/bathroom Home Equipment: Bedside commode;Walker - 2 wheels;Wheelchair - Government social research officer - single point Additional Comments: pt refused to answer questions directly -"my house is handicap accessible. that answers all your questions." Prior Function Level of Independence: Independent with assistive device(s) Comments: using cane intermittently for R ankle pain Communication Communication:  No difficulties    Cognition  Cognition Arousal/Alertness: Awake/alert Behavior During Therapy: WFL for tasks assessed/performed Overall Cognitive Status: Within Functional Limits for tasks assessed    Extremity/Trunk Assessment Upper Extremity Assessment Upper Extremity Assessment: Overall WFL for tasks assessed Lower Extremity Assessment Lower Extremity Assessment: Overall WFL for tasks assessed Cervical / Trunk Assessment Cervical / Trunk Assessment: Normal   Balance    End of Session PT - End of Session Equipment Utilized During Treatment: Gait belt Activity Tolerance: Patient tolerated treatment well Patient left: Other (comment);with family/visitor present (pt. on 3n1 with OT in room ) Nurse Communication: Mobility status;Other (comment) (Pt. with saturated operative dressing, pt. gown and bed sheet)  GP     Ferman Hamming 04/17/2013, 12:40 PM Weldon Picking PT Acute Rehab Services [phone] Beeper [phone]

## 2013-04-17 NOTE — Progress Notes (Signed)
UR COMPLETED  

## 2013-04-17 NOTE — Progress Notes (Signed)
Reviewed plan of care with patient. Informed patient PCA and foley will be discontinued in the morning. Patient firmly refused and state he would like to speak with MD in the morning first because of his pain issue and hx of BPH. Patient was educated about the risk of UTI with prolong foley use and progression towards discharge and oral pain medication. Patient stated he is well aware and have dealt with this numerous times and knows all about it and what is best for himself. Patient been repositioned and is currently resting comfortably. Will continue to monitor patient.

## 2013-04-17 NOTE — Evaluation (Signed)
Occupational Therapy Evaluation Patient Details Name: [name] MRN: [id number] DOB: Nov 15, 1935 Today's Date: 04/17/2013 Time: 4098-1191 OT Time Calculation (min): 41 min  OT Assessment / Plan / Recommendation History of present illness S/p L4 - L5 revision decompression and fusion   Clinical Impression   Pt s/pL4-L5 revision decompression and fusion thus affecting PLOF. Will continue to follow acutely in order to address below problem list in prep for return home with wife.    OT Assessment  Patient needs continued OT Services    Follow Up Recommendations  No OT follow up;Supervision/Assistance - 24 hour    Barriers to Discharge      Equipment Recommendations  None recommended by OT    Recommendations for Other Services    Frequency  Min 2X/week    Precautions / Restrictions Precautions Precautions: Back Precaution Booklet Issued: Yes (comment) Precaution Comments: Reviewed 3/3 back precautions Restrictions Weight Bearing Restrictions: No   Pertinent Vitals/Pain See vitals    ADL  Eating/Feeding: Performed;Independent Where Assessed - Eating/Feeding: Edge of bed Grooming: Performed;Wash/dry hands;Supervision/safety Where Assessed - Grooming: Unsupported standing Upper Body Bathing: Simulated;Set up Where Assessed - Upper Body Bathing: Unsupported sitting Lower Body Bathing: Simulated;Moderate assistance Where Assessed - Lower Body Bathing: Supported sit to stand Upper Body Dressing: Performed;Set up Where Assessed - Upper Body Dressing: Unsupported sitting Lower Body Dressing: Performed;Maximal assistance Where Assessed - Lower Body Dressing: Supported sit to stand Toilet Transfer: Performed;Minimal assistance Toilet Transfer Method: Sit to Barista: Raised toilet seat with arms (or 3-in-1 over toilet) Toileting - Clothing Manipulation and Hygiene: Performed;Minimal assistance Where Assessed - Engineer, mining and Hygiene:  Sit to stand from 3-in-1 or toilet Equipment Used: Rolling walker Transfers/Ambulation Related to ADLs: Min guard with RW ADL Comments: Pt independently verbalized method he uses at home to donn/doff socks by crossing ankles over knees.  However, pt too sore/stiff to cross ankles over knees today and requested therapist assist with donning socks.  Pt's dressing saturated with drainage on OT/PT arrival. Notified RN Marylene Land) who arrived during session to see dressing and incision and then replaced old dressing with new one.    OT Diagnosis: Generalized weakness;Acute pain  OT Problem List: Decreased strength;Decreased activity tolerance;Decreased knowledge of use of DME or AE;Decreased knowledge of precautions;Pain OT Treatment Interventions: Self-care/ADL training;DME and/or AE instruction;Therapeutic activities;Patient/family education;Balance training   OT Goals(Current goals can be found in the care plan section) Acute Rehab OT Goals Patient Stated Goal: to return to being active OT Goal Formulation: With patient Time For Goal Achievement: 04/24/13 Potential to Achieve Goals: Good  Visit Information  Last OT Received On: 04/17/13 Assistance Needed: +1 PT/OT Co-Evaluation/Treatment: Yes History of Present Illness: L4 - L5 REVISION DECOMPRESSION AND FUSION 1 LEVEL       Prior Functioning     Home Living Family/patient expects to be discharged to:: Private residence Living Arrangements: Spouse/significant other Available Help at Discharge: Family Type of Home: House Home Access: Level entry Home Layout: Multi-level;Able to live on main level with bedroom/bathroom Home Equipment: Bedside commode;Walker - 2 wheels;Wheelchair - Government social research officer - single point Prior Function Level of Independence: Independent with assistive device(s) Communication Communication: No difficulties         Vision/Perception     Cognition  Cognition Arousal/Alertness:  Awake/alert Behavior During Therapy: WFL for tasks assessed/performed Overall Cognitive Status: Within Functional Limits for tasks assessed    Extremity/Trunk Assessment Upper Extremity Assessment Upper Extremity Assessment: Overall WFL for tasks assessed  Mobility Bed Mobility Bed Mobility: Rolling Right;Right Sidelying to Sit;Sitting - Scoot to Edge of Bed Rolling Right: 5: Supervision Right Sidelying to Sit: 5: Supervision;HOB flat Sitting - Scoot to Edge of Bed: 5: Supervision Details for Bed Mobility Assistance: Supervision to ensure proper log roll technique. No physical assist required. Incr time needed. Transfers Transfers: Sit to Stand;Stand to Sit Sit to Stand: 4: Min assist;From bed;From chair/3-in-1;With upper extremity assist;With armrests Stand to Sit: 4: Min guard;To chair/3-in-1;With armrests;With upper extremity assist Details for Transfer Assistance: Assist for power up and support while transitioning UEs to RW once in standing.     Exercise     Balance     End of Session OT - End of Session Equipment Utilized During Treatment: Rolling walker Activity Tolerance: Patient tolerated treatment well Patient left: in chair;with call bell/phone within reach;with family/visitor present;with nursing/sitter in room Nurse Communication: Mobility status;Other (comment) (dressing saturated with drainage from incision site)  GO    04/17/2013 Cipriano Mile OTR/L Pager [phone] Office [phone]  Cipriano Mile 04/17/2013, 10:14 AM

## 2013-04-17 NOTE — Progress Notes (Signed)
04/17/13 Spoke with patient and his wife about HHC, he stated that he is not going to need any home health. PT currently recommending HHPT but dependent on progress. Patient has rolling walker and  3N1, no equipment needs identified. Will continue to follow for PT progress and HH needs. Jacquelynn Cree RN, BSN, CCM

## 2013-04-17 NOTE — Op Note (Signed)
NAMESETH, HIGGINBOTHAM NO.:  [id number]  MEDICAL RECORD NO.:  [id number]  LOCATION:  5N07C                        FACILITY:  [facility]  PHYSICIAN:  Alvy Beal, MD    DATE OF BIRTH:  02-23-1936  DATE OF PROCEDURE:  04/16/2013 DATE OF DISCHARGE:                              OPERATIVE REPORT   PREOPERATIVE DIAGNOSIS:  Recurrent spinal stenosis with iatrogenic spondylolisthesis, L4-5.  POSTOPERATIVE DIAGNOSIS:  Recurrent spinal stenosis with iatrogenic spondylolisthesis, L4-5.  OPERATIVE PROCEDURES: 1. Revision L4-5 Gill decompression, as well as midline L5-S1     decompression. 2. Posterolateral instrumented fusion, L4-5, with pedicle screw     fixation. 3. Posterolateral arthrodesis with L4-5 with local bone graft.  COMPLICATIONS:  None.  CONDITION:  Stable.  SURGEON:  Alvy Beal, MD  FIRST ASSISTANT:  Genene Churn. Barry Dienes, [place].  HISTORY:  This is a very pleasant gentleman who about 8 months ago had a lumbar decompression.  He has had recurrent severe back, buttock, and leg pain after recovering from a leg injury, using a new walker.  As a result of severe pain, a repeat CT myelogram was done, which demonstrated pars fracture, significant recurrent spinal stenosis, and spondylolisthesis.  As a result of the failure of conservative management to control his pain, we elected to proceed with surgery.  All appropriate risks, benefits, and alternatives were discussed with the patient and consent was obtained.  OPERATIVE NOTE:  The patient was brought to the operating room, placed supine on the operating table.  After successful induction of general anesthesia and endotracheal intubation, TEDs, SCDs, and Foley were inserted.  The patient was turned prone onto the spine frame and all bony prominences were well padded.  Back was prepped and draped in a standard fashion.  Time-out was taken to confirm the patient, procedure, and all other pertinent important  data.  Once this was done, the previous midline incision was infiltrated with 0.25% Marcaine and then a midline incision was made.  Sharp dissection was carried out down to the deep fascia.  Deep fascia was sharply incised and then I palpated the L2 spinous process.  I dissected down the spinous process and lamina to expose the T3 facet complex.  This gave me an idea of a normal anatomy. There was a significant amount of scar tissue noted.  I then went down to the L5-S1 facet complex and identified this and then dissected out the remaining portion of the L5 lamina.  Based on the CT scan, there was still in the majority of L5 central region.  Although the spinous process had been removed, the lamina had not been violated.  Once I had this bilaterally exposed, I was able to begin to debulk the significant amount of scar tissue.  Once this was done, I then developed a plane underneath the L5 lamina.  Then, using a combination of 2 and 3 mm Kerrisons, I completed an L5 laminectomy.  There was a significant amount of scar tissue in the lateral gutter as I approached the L4-5 level.  I then was able to get into the lateral recess at L5, identified the L5 pars as well as the  L5 foramen and nerve root.  I traced the L5 nerve root and performed a foraminotomy bilaterally using 2 and 3 mm Kerrisons.  I then palpated the L5 pedicle and I was able to visualize the medial and inferior borders of it.  This confirmed an adequate central decompression.  I then used fine nerve hooks Penfield 4, Woodson elevator to begin to dissect through the scar tissue and leave the thecal sac untraumatized.  Once I was able dissecting through this creating plans, I used a 2 and 3 mm Kerrisons to resect it.  I continued superiorly and then I used an osteotome to debulk the L4 inferior facet bilaterally.  Once I had the L4 inferior facet removed in its entirety, I could continue my dissection superiorly.  I then  identified the L4 pars and completed the resection on the fractured side which was the left side and completed an L5 foramen, completed pars excision on the right.  I could now visualize and palpate the inferior aspect of the L4 pedicle.  I could also see the L4 nerve roots bilaterally.  Once I had unroofed this and then completed my decompression, I continued superiorly until I could palpate the superior portion of the L4 pedicle. I confirmed with x-ray that I was at the superior portion of the L4 pedicle.  This corresponded with an area on the CT scan where the stenosis was not significant.  At this point, I had decompressed entirely from the superior aspect of the pedicle 4 into the inferior aspect of the pedicle 5.  There was still some central scar tissue that was very adherent to the thecal sac, but I had left this in place as it was adequately decompressed.  At this point, I irrigated the wound copiously with normal saline.  I identified the L4 and L5 transverse processes, decorticated them and then used an awl to breach the cortex. A pedicle probe was advanced, starting at the midportion of the transverse process.  At the junction of the CT and facet, I advanced the probe through the pedicle and into the body of L4.  I repeated this at L5 and on the contralateral side.  I then probed with a ball-tipped Feeler, tapped, and then probed again with a ball-tipped Feeler.  At L4, I placed a 6.0 diameter, 45-mm length pedicle screw.  Both screws had excellent purchase.  Once they were in, I was able to palpate the pedicle with medial border of the pedicle and confirmed that there was no breach.  At L5, after tapping and palpating with a ball-tipped Feeler, I placed a 7.0 diameter, 45-mm screw, again there was no breach. I then placed a Conform allograft sheet over the transverse process. This allowed me to maintain my part of the decompression and then placed the autograft bone on top of  this to prevent it from falling down and causing pressure on the nerve root.  Once both sides were down, I obtained hemostasis using bipolar electrocautery.  I placed a thrombin- soaked Gelfoam over the exposed thecal sac and then placed the antibiotic impregnated beads into the wound to assist in preventing infection.  I then closed the deep layer with interrupted #1 Vicryl sutures, superficial 2-0 Vicryl sutures, and a 3-0 Monocryl for the skin.  Steri-Strips and a dry dressing were applied.  The patient was ultimately extubated and transferred to the PACU without incident.  At the end of the case, all needle and sponge counts were correct.  There  was no adverse intraoperative events.     Alvy Beal, MD     DDB/MEDQ  D:  04/16/2013  T:  04/17/2013  Job:  [id number]

## 2013-04-18 LAB — CBC
Hemoglobin: 11 g/dL — ABNORMAL LOW (ref 13.0–17.0)
MCH: 29.9 pg (ref 26.0–34.0)
Platelets: 209 10*3/uL (ref 150–400)
RBC: 3.68 MIL/uL — ABNORMAL LOW (ref 4.22–5.81)
WBC: 14.5 10*3/uL — ABNORMAL HIGH (ref 4.0–10.5)

## 2013-04-18 LAB — GLUCOSE, CAPILLARY
Glucose-Capillary: 136 mg/dL — ABNORMAL HIGH (ref 70–99)
Glucose-Capillary: 148 mg/dL — ABNORMAL HIGH (ref 70–99)
Glucose-Capillary: 173 mg/dL — ABNORMAL HIGH (ref 70–99)
Glucose-Capillary: 177 mg/dL — ABNORMAL HIGH (ref 70–99)

## 2013-04-18 LAB — BASIC METABOLIC PANEL
CO2: 27 mEq/L (ref 19–32)
Chloride: 102 mEq/L (ref 96–112)
Glucose, Bld: 176 mg/dL — ABNORMAL HIGH (ref 70–99)
Potassium: 4.2 mEq/L (ref 3.5–5.1)
Sodium: 139 mEq/L (ref 135–145)

## 2013-04-18 MED ORDER — METHOCARBAMOL 750 MG PO TABS
750.0000 mg | ORAL_TABLET | Freq: Four times a day (QID) | ORAL | Status: DC | PRN
  Administered 2013-04-19 (×2): 750 mg via ORAL
  Filled 2013-04-18 (×2): qty 1

## 2013-04-18 MED ORDER — METHOCARBAMOL 100 MG/ML IJ SOLN
500.0000 mg | Freq: Four times a day (QID) | INTRAVENOUS | Status: DC | PRN
  Filled 2013-04-18: qty 5

## 2013-04-18 MED ORDER — OXYCODONE HCL ER 10 MG PO T12A
10.0000 mg | EXTENDED_RELEASE_TABLET | Freq: Two times a day (BID) | ORAL | Status: DC
  Administered 2013-04-18 – 2013-04-19 (×3): 10 mg via ORAL
  Filled 2013-04-18 (×3): qty 1

## 2013-04-18 MED ORDER — OXYCODONE-ACETAMINOPHEN 5-325 MG PO TABS
1.0000 | ORAL_TABLET | Freq: Four times a day (QID) | ORAL | Status: DC | PRN
  Administered 2013-04-19 (×2): 1 via ORAL
  Filled 2013-04-18 (×2): qty 1

## 2013-04-18 MED ORDER — OXYCODONE-ACETAMINOPHEN 5-325 MG PO TABS
1.0000 | ORAL_TABLET | Freq: Four times a day (QID) | ORAL | Status: DC | PRN
  Administered 2013-04-18: 2 via ORAL
  Filled 2013-04-18: qty 2

## 2013-04-18 MED ORDER — OXYCODONE HCL 5 MG PO TABS
5.0000 mg | ORAL_TABLET | Freq: Four times a day (QID) | ORAL | Status: DC | PRN
  Administered 2013-04-19 (×2): 5 mg via ORAL
  Filled 2013-04-18 (×2): qty 1

## 2013-04-18 MED ORDER — OXYCODONE HCL 5 MG PO TABS
5.0000 mg | ORAL_TABLET | Freq: Four times a day (QID) | ORAL | Status: DC | PRN
  Administered 2013-04-18: 5 mg via ORAL
  Filled 2013-04-18: qty 1

## 2013-04-18 MED ORDER — PREGABALIN 50 MG PO CAPS
50.0000 mg | ORAL_CAPSULE | Freq: Every day | ORAL | Status: DC
Start: 2013-04-18 — End: 2013-04-19
  Administered 2013-04-18: 50 mg via ORAL
  Filled 2013-04-18: qty 1

## 2013-04-18 MED ORDER — OXYCODONE HCL 5 MG PO TABS
10.0000 mg | ORAL_TABLET | Freq: Four times a day (QID) | ORAL | Status: DC | PRN
  Administered 2013-04-18: 10 mg via ORAL
  Filled 2013-04-18: qty 2

## 2013-04-18 NOTE — Progress Notes (Addendum)
PT Cancellation Note  Patient Details Name: [name] MRN: [id number] DOB: 1935-08-30   Cancelled Treatment:    Reason Eval/Treat Not Completed: Pain limiting ability to participate.  Pt reports he is in too much pain right now.  He would like PT to check back at 1700.  PT will check back as time and schedule allows.   04/18/2013 @ 1637: checked back with pt who continued to not want to walk with therapy today.  PT to check back tomorrow.    Rollene Rotunda Adrean Heitz, PT, DPT [phone]   04/18/2013, 12:14 PM

## 2013-04-18 NOTE — Progress Notes (Signed)
OT Cancellation Note  Patient Details Name: [name] MRN: [id number] DOB: [birth year]   Cancelled Treatment:    Reason Eval/Treat Not Completed: Pain limiting ability to participate. Pt states that his meds are just not working right and he might be able to be ready for therapy about 4:30 or 5:00 this afternoon. Informed him that we would try back tomorrow.  Evette Georges [phone] 04/18/2013, 12:25 PM

## 2013-04-18 NOTE — Progress Notes (Signed)
    Subjective: Procedure(s) (LRB): L4 - L5 REVISION DECOMPRESSION AND FUSION 1 LEVEL (N/A) 2 Days Post-Op  Patient reports pain as 3 on 0-10 scale.  Reports decreased leg pain reports incisional back pain   Positive void Negative bowel movement Positive flatus Negative chest pain or shortness of breath  Objective: Vital signs in last 24 hours: Temp:  [98.5 F (36.9 C)-99 F (37.2 C)] 99 F (37.2 C) (09/05 1300) Pulse Rate:  [67-73] 73 (09/05 1300) Resp:  [16] 16 (09/05 1300) BP: (130-142)/(53-66) 135/53 mmHg (09/05 1300) SpO2:  [97 %-100 %] 100 % (09/05 1300)  Intake/Output from previous day: 09/04 0701 - 09/05 0700 In: -  Out: 300 [Urine:300]  Labs:  Recent Labs  04/17/13 0827 04/18/13 0555  WBC 8.5 14.5*  RBC 3.71* 3.68*  HCT 32.4* 31.8*  PLT 190 209    Recent Labs  04/17/13 1235 04/18/13 0555  NA 134* 139  K 4.1 4.2  CL 100 102  CO2 25 27  BUN 17 15  CREATININE 0.79 0.68  GLUCOSE 248* 176*  CALCIUM 8.8 9.5   No results found for this basename: LABPT, INR,  in the last 72 hours  Physical Exam: Neurologically intact ABD soft Neurovascular intact Incision: dressing C/D/I and no drainage Compartment soft  Assessment/Plan: Patient stable  xrays satisfactory Continue mobilization with physical therapy Continue care  Advance diet Up with therapy Modify pain control with Oxycontin BID and oxycodone for breakthrough  Venita Lick, MD [facility] [phone]

## 2013-04-18 NOTE — Progress Notes (Signed)
Wasted 2ml of morphine PCA syringe that was d/c`d 04/17/13.  Unable to locate to waste in pyxis under patient's name.  Witnessed by Weyman Rodney RN.

## 2013-04-19 LAB — CBC
HCT: 29.9 % — ABNORMAL LOW (ref 39.0–52.0)
MCH: 29.4 pg (ref 26.0–34.0)
MCHC: 33.8 g/dL (ref 30.0–36.0)
MCV: 86.9 fL (ref 78.0–100.0)
Platelets: 215 10*3/uL (ref 150–400)
RDW: 13.1 % (ref 11.5–15.5)
WBC: 13.9 10*3/uL — ABNORMAL HIGH (ref 4.0–10.5)

## 2013-04-19 LAB — BASIC METABOLIC PANEL
BUN: 18 mg/dL (ref 6–23)
Calcium: 9.2 mg/dL (ref 8.4–10.5)
Chloride: 103 mEq/L (ref 96–112)
Creatinine, Ser: 0.66 mg/dL (ref 0.50–1.35)
GFR calc Af Amer: >90 mL/min (ref >90)

## 2013-04-19 LAB — GLUCOSE, CAPILLARY: Glucose-Capillary: 190 mg/dL — ABNORMAL HIGH (ref 70–99)

## 2013-04-19 MED ORDER — POLYETHYLENE GLYCOL 3350 17 GM/SCOOP PO POWD: 17.0000 g | Freq: Every day | ORAL | Status: DC

## 2013-04-19 MED ORDER — METHOCARBAMOL 500 MG PO TABS: 500.0000 mg | ORAL_TABLET | Freq: Three times a day (TID) | ORAL | Status: DC

## 2013-04-19 MED ORDER — ONDANSETRON HCL 4 MG PO TABS: 4.0000 mg | ORAL_TABLET | Freq: Three times a day (TID) | ORAL | Status: DC | PRN

## 2013-04-19 MED ORDER — OXYCODONE-ACETAMINOPHEN 10-325 MG PO TABS: 1.0000 | ORAL_TABLET | ORAL | Status: DC | PRN

## 2013-04-19 MED ORDER — DOCUSATE SODIUM 100 MG PO CAPS: 100.0000 mg | ORAL_CAPSULE | Freq: Three times a day (TID) | ORAL | Status: DC | PRN

## 2013-04-19 MED ORDER — OXYCODONE HCL ER 10 MG PO T12A: 10.0000 mg | EXTENDED_RELEASE_TABLET | Freq: Two times a day (BID) | ORAL | Status: DC

## 2013-04-19 NOTE — Progress Notes (Signed)
    Subjective: Procedure(s) (LRB): L4 - L5 REVISION DECOMPRESSION AND FUSION 1 LEVEL (N/A) 3 Days Post-Op  Patient reports pain as 2 on 0-10 scale.  Reports decreased leg pain reports incisional back pain   Positive void Positive bowel movement Positive flatus Negative chest pain or shortness of breath  Objective: Vital signs in last 24 hours: Temp:  [97.4 F (36.3 C)-99 F (37.2 C)] 97.4 F (36.3 C) (09/06 0641) Pulse Rate:  [61-73] 72 (09/06 0641) Resp:  [16-18] 18 (09/06 0641) BP: (135-146)/(53-64) 146/58 mmHg (09/06 0641) SpO2:  [98 %-100 %] 98 % (09/06 0641)  Intake/Output from previous day:    Labs:  Recent Labs  04/18/13 0555 04/19/13 0550  WBC 14.5* 13.9*  RBC 3.68* 3.44*  HCT 31.8* 29.9*  PLT 209 215    Recent Labs  04/18/13 0555 04/19/13 0550  NA 139 139  K 4.2 4.2  CL 102 103  CO2 27 28  BUN 15 18  CREATININE 0.68 0.66  GLUCOSE 176* 198*  CALCIUM 9.5 9.2   No results found for this basename: LABPT, INR,  in the last 72 hours  Physical Exam: Neurologically intact ABD soft Neurovascular intact Incision: dressing C/D/I and no drainage Compartment soft  Assessment/Plan: Patient stable  xrays satisfactory Continue mobilization with physical therapy Continue care  Advance diet Up with therapy Discharge home with home health   Venita Lick, MD [facility] [phone]

## 2013-04-19 NOTE — Progress Notes (Signed)
Physical Therapy Treatment Patient Details Name: [name] MRN: [id number] DOB: 1935-10-12 Today's Date: 04/19/2013 Time: 4098-1191 PT Time Calculation (min): 23 min  PT Assessment / Plan / Recommendation  History of Present Illness L4 - L5 REVISION DECOMPRESSION AND FUSION 1 LEVEL   PT Comments   Pt. Making good gains with mobility today. He is mobilizing at mod I level.   He does not have steps to enter his home, so this did not need to be practiced.  He will benefit from HHPT to reinforce back precautions and technique.  Follow Up Recommendations  Home health PT;Supervision/Assistance - 24 hour;Supervision for mobility/OOB     Does the patient have the potential to tolerate intense rehabilitation     Barriers to Discharge        Equipment Recommendations  None recommended by PT    Recommendations for Other Services    Frequency Min 6X/week   Progress towards PT Goals Progress towards PT goals: Progressing toward goals  Plan Current plan remains appropriate    Precautions / Restrictions Precautions Precautions: Back Precaution Comments: Reviewed 3/3 back precautions; pt. could not state without prompting and reminders Restrictions Weight Bearing Restrictions: No   Pertinent Vitals/Pain See vitals tab     Mobility  Bed Mobility Bed Mobility: Rolling Right;Right Sidelying to Sit;Sitting - Scoot to [place] of Bed Rolling Right: 6: Modified independent (Device/Increase time) Right Sidelying to Sit: HOB flat;6: Modified independent (Device/Increase time) Sitting - Scoot to Edge of Bed: 6: Modified independent (Device/Increase time) Details for Bed Mobility Assistance: reminders to log roll and aviod twisting Transfers Transfers: Sit to Stand;Stand to Sit Sit to Stand: 6: Modified independent (Device/Increase time);From bed;With upper extremity assist Stand to Sit: 6: Modified independent (Device/Increase time);To chair/3-in-1;With armrests Details for Transfer Assistance:  reminders for correct technique but no physical assist needed this am Ambulation/Gait Ambulation/Gait Assistance: 6: Modified independent (Device/Increase time) Ambulation Distance (Feet): 200 Feet Assistive device: Rolling walker Ambulation/Gait Assistance Details: reminder to stand as erect as he is able but no physical assist needed and no overt LOB Gait Pattern: Step-through pattern Gait velocity: decreased Stairs: No    Exercises     PT Diagnosis:    PT Problem List:   PT Treatment Interventions:     PT Goals (current goals can now be found in the care plan section) Acute Rehab PT Goals Patient Stated Goal: to return to being active  Visit Information  Last PT Received On: 04/19/13 Assistance Needed: +1 History of Present Illness: L4 - L5 REVISION DECOMPRESSION AND FUSION 1 LEVEL    Subjective Data  Subjective: Attempted to see earlier this am, pt. declined due to just had pain med and wanted to let things "settle down".  Agreeable to PT on second attempt. Patient Stated Goal: to return to being active   Cognition  Cognition Arousal/Alertness: Awake/alert Behavior During Therapy: WFL for tasks assessed/performed Overall Cognitive Status: Within Functional Limits for tasks assessed    Balance     End of Session PT - End of Session Equipment Utilized During Treatment: Gait belt Activity Tolerance: Patient tolerated treatment well Patient left: in chair;with call bell/phone within reach;with family/visitor present Nurse Communication: Mobility status   GP     Ferman Hamming 04/19/2013, 10:26 AM Weldon Picking PT Acute Rehab Services [phone] Beeper [phone]

## 2013-04-19 NOTE — Progress Notes (Signed)
OT Cancellation Note and Discharge  Patient Details Name: [name] MRN: [id number] DOB: 1935-12-20   Cancelled Treatment:    Reason Eval/Treat Not Completed: Other (comment). When explained to pt about why OT was there to see him (educate on LB ADLs, AE, shower transfer, and toilet transfer) pt reported he already knew how to do this and his wife would also help him. Asked him if he knew the 3 movements that he was supposed to avoid after a back surgery and he said, " I know how to take care of myself". I asked him again and referred to the acronym (BAT) that we use to help patients remember their precautions and his response was again again, "I may not know your terminology but I know how to take care of myself, that sounds like a sandwich". I educated him again on No Bending, No Arching, No Twisting. No further education do be completed with pt since he feels there is no need. Acute OT will sign off.  Evette Georges [phone] 04/19/2013, 10:00 AM

## 2013-04-19 NOTE — Discharge Summary (Signed)
Patient ID: [name] MRN: [id number] DOB/AGE: 1936-08-13 77 y.o.  Admit date: 04/16/2013 Discharge date: 04/19/2013  Admission Diagnoses:  Active Problems:   * No active hospital problems. *   Discharge Diagnoses:  Active Problems:   * No active hospital problems. *  status post Procedure(s): L4 - L5 REVISION DECOMPRESSION AND FUSION 1 LEVEL  Past Medical History  Diagnosis Date  . Fatigue   . Aortic valve disease     SCLEROSIS WITHOUT STENOSIS  . Hyperlipidemia   . PVC's (premature ventricular contractions)   . Sciatica   . Anemia   . Coronary artery disease CARDOLOGIST- DR BRACKBILL    cardiac catheterization 2005 / EF 50%  . Diabetes mellitus, type 2   . DDD (degenerative disc disease)   . RA (rheumatoid arthritis) RHEUMATOLOGIST-  DR Dareen Piano    SEVERE  . Chronic low back pain     CHRONIC NARCOTIC USE  . Systolic murmur   . Anxiety   . Hypertension     dr Patty Sermons  . BPH (benign prostatic hyperplasia)     Surgeries: Procedure(s): L4 - L5 REVISION DECOMPRESSION AND FUSION 1 LEVEL on 04/16/2013   Consultants:    Discharged Condition: Improved  Hospital Course: [name] is an 77 y.o. male who was admitted 04/16/2013 for operative treatment of <principal problem not specified>. Patient failed conservative treatments (please see the history and physical for the specifics) and had severe unremitting pain that affects sleep, daily activities and work/hobbies. After pre-op clearance, the patient was taken to the operating room on 04/16/2013 and underwent  Procedure(s): L4 - L5 REVISION DECOMPRESSION AND FUSION 1 LEVEL.    Patient was given perioperative antibiotics: Anti-infectives   Start     Dose/Rate Route Frequency Ordered Stop   04/16/13 2200  ceFAZolin (ANCEF) IVPB 1 g/50 mL premix     1 g 100 mL/hr over 30 Minutes Intravenous Every 8 hours 04/16/13 1913 04/16/13 2324   04/16/13 1329  vancomycin (VANCOCIN) powder  Status:  Discontinued       As  needed 04/16/13 1330 04/16/13 1532   04/15/13 1425  ceFAZolin (ANCEF) IVPB 2 g/50 mL premix     2 g 100 mL/hr over 30 Minutes Intravenous 30 min pre-op 04/15/13 1425 04/16/13 1250       Patient was given sequential compression devices and early ambulation to prevent DVT.   Patient benefited maximally from hospital stay and there were no complications. At the time of discharge, the patient was urinating/moving their bowels without difficulty, tolerating a regular diet, pain is controlled with oral pain medications and they have been cleared by PT/OT.   Recent vital signs: Patient Vitals for the past 24 hrs:  BP Temp Temp src Pulse Resp SpO2  04/19/13 0641 146/58 mmHg 97.4 F (36.3 C) Oral 72 18 98 %  04/18/13 1956 142/61 mmHg 98.7 F (37.1 C) Oral 61 18 100 %  04/18/13 1300 135/53 mmHg 99 F (37.2 C) - 73 16 100 %  04/18/13 1028 140/64 mmHg - - - - -     Recent laboratory studies:  Recent Labs  04/18/13 0555 04/19/13 0550  WBC 14.5* 13.9*  HGB 11.0* 10.1*  HCT 31.8* 29.9*  PLT 209 215  NA 139 139  K 4.2 4.2  CL 102 103  CO2 27 28  BUN 15 18  CREATININE 0.68 0.66  GLUCOSE 176* 198*  CALCIUM 9.5 9.2     Discharge Medications:  Medication List    STOP taking these medications       aspirin EC 81 MG tablet     celecoxib 200 MG capsule  Commonly known as:  CELEBREX     COSAMIN DS 500-400 MG tablet  Generic drug:  glucosamine-chondroitin     fentaNYL 12 MCG/HR  Commonly known as:  DURAGESIC - dosed mcg/hr     fentaNYL 25 MCG/HR patch  Commonly known as:  DURAGESIC - dosed mcg/hr     Fish Oil 1200 MG Caps     Garlic 1000 MG Caps     HYDROcodone-acetaminophen 5-325 MG per tablet  Commonly known as:  NORCO/VICODIN     tiZANidine 4 MG capsule  Commonly known as:  ZANAFLEX     traMADol-acetaminophen 37.5-325 MG per tablet  Commonly known as:  ULTRACET      TAKE these medications       amLODipine 10 MG tablet  Commonly known as:  NORVASC  Take 1  tablet (10 mg total) by mouth every morning.     B-complex with vitamin C tablet  Take 1 tablet by mouth 2 (two) times daily.     cholecalciferol 1000 UNITS tablet  Commonly known as:  VITAMIN D  Take 2,000 Units by mouth 2 (two) times daily.     CITRACAL + D PO  Take 1 tablet by mouth 2 (two) times daily.     DULoxetine 60 MG capsule  Commonly known as:  CYMBALTA  Take 60 mg by mouth at bedtime.     ferrous sulfate 325 (65 FE) MG tablet  Take 325 mg by mouth 2 (two) times daily.     leflunomide 20 MG tablet  Commonly known as:  ARAVA  Take 20 mg by mouth every morning.     lisinopril 20 MG tablet  Commonly known as:  PRINIVIL,ZESTRIL  Take 20 mg by mouth every morning.     metFORMIN 500 MG tablet  Commonly known as:  GLUCOPHAGE  Take 1,000 mg by mouth 2 (two) times daily with a meal.     methocarbamol 500 MG tablet  Commonly known as:  ROBAXIN  Take 1 tablet (500 mg total) by mouth every 6 (six) hours as needed (spasms).     MSM 1000 MG Caps  Take 1,000 mg by mouth daily.     multivitamin with minerals Tabs tablet  Take 1 tablet by mouth 2 (two) times daily.     NASONEX 50 MCG/ACT nasal spray  Generic drug:  mometasone  Place 1 spray into the nose daily as needed. Allergies     oxyCODONE-acetaminophen 10-325 MG per tablet  Commonly known as:  PERCOCET  Take 1-2 tablets by mouth every 4 (four) hours as needed for pain.     oxymetazoline 0.05 % nasal spray  Commonly known as:  AFRIN  Place 2 sprays into the nose daily. As needed for congestion     predniSONE 5 MG tablet  Commonly known as:  DELTASONE  Take 5 mg by mouth every morning.     pregabalin 75 MG capsule  Commonly known as:  LYRICA  Take 75 mg by mouth 2 (two) times daily.     tamsulosin 0.4 MG Caps capsule  Commonly known as:  FLOMAX  Take 0.4 mg by mouth daily.     temazepam 30 MG capsule  Commonly known as:  RESTORIL  Take 1 capsule (30 mg total) by mouth at bedtime.        Diagnostic  Studies: Dg Lumbar Spine 2-3 Views  04/17/2013   *RADIOLOGY REPORT*  Clinical Data: Spinal fusion.  LUMBAR SPINE - 2-3 VIEW  Comparison: 09/02 and 09/03.  Findings: L4-L5 posterior rod and pedicle screw fixation.  Wide posterior decompression through laminectomy with morcellated Bone graft extending from L3-L5.  There is no hardware complication.  There is obliquity on the lateral view partially due to the scoliosis.  No hardware failure or disconnection.  IMPRESSION: L4-L5 PLIF with a L4 and L5 laminectomy and laminotomies at L3.   Original Report Authenticated By: Andreas Newport, M.D.   Dg Lumbar Spine 2-3 Views  04/15/2013   *RADIOLOGY REPORT*  Clinical Data: Preop lumbar fusion  LUMBAR SPINE - 2-3 VIEW  Comparison: CT 03/20/2013  Findings: Narrowing of interspaces L2-S1, most marked L5-S1 with vacuum phenomenon.  Small end plate spurs L2 -S1.  Stable grade 1 anterolisthesis L4-5 with pars   defect evident. Postop changes L3- L5.  IMPRESSION:  1. Negative for fracture or other acute bony abnormality. 2.  Multilevel degenerative change as above.   Original Report Authenticated By: D. Andria Rhein, MD   Dg Lumbar Spine Complete  04/16/2013   *RADIOLOGY REPORT*  Clinical Data: Spinal stenosis.  DG C-ARM 61-120 MIN,LUMBAR SPINE - COMPLETE 4+ VIEW  Comparison: CT myelogram dated 03/20/2013  Findings: AP and lateral C-arm images demonstrate that the patient has undergone posterior fusion at L4-5.  Pedicle screws are present with posterior rods.  IMPRESSION: Posterior fusion performed at L4-5.   Original Report Authenticated By: Francene Boyers, M.D.   Ct Lumbar Spine Wo Contrast  03/20/2013   *RADIOLOGY REPORT*  Clinical Data: Lumbosacral spondylosis.  Back pain.  Back surgery  CT LUMBAR SPINE WITHOUT CONTRAST  Technique:  Multidetector CT imaging of the lumbar spine was performed without intravenous contrast administration. Multiplanar CT image reconstructions were also generated.  Comparison: No prior  cross-sectional imaging of the lumbar spine. Operative x-ray 07/17/2012.  Findings: Grade 1 anterior slip L4-5.  No vertebral body fracture is identified.  Negative for aortic aneurysm.  L1-2:  Disc degeneration with disc calcification.  Mild facet degeneration.  L2-3:  Disc degeneration with disc bulging.  Bilateral facet hypertrophy and mild spinal stenosis.  L3-4:    Diffuse disc bulging and spondylosis.  Bilateral facet hypertrophy.  There  has  been a small laminotomy in the midline with removal of the spinous process.  There is moderately severe spinal stenosis.  L4-5:  5 mm anterior slip with diffuse disc bulging.  There is severe facet hypertrophy.  There is a small midline laminotomy and removal of the spinous process.  Nevertheless there remains moderate to severe spinal stenosis.  There is a unilateral pars defect on the left.  This may be a recent pars fracture with sharply defined margins and no significant bony healing.  L5-S1:  Disc degeneration and spondylosis.  There is facet hypertrophy and foraminal encroachment bilaterally.  IMPRESSION: Moderate spinal stenosis at L3-4.  Prior midline laminotomy.  Moderate to severe spinal stenosis L4-5 with small midline laminotomy.  Unilateral pars fracture on the left at L4.  Correlate with prior studies.  This may be a recent fracture.  Spondylosis and foraminal stenosis bilaterally at L5-S1.   Original Report Authenticated By: Janeece Riggers, M.D.   Dg C-arm [phone] Min  04/16/2013   *RADIOLOGY REPORT*  Clinical Data: Spinal stenosis.  DG C-ARM 61-120 MIN,LUMBAR SPINE - COMPLETE 4+ VIEW  Comparison: CT myelogram dated 03/20/2013  Findings: AP and lateral C-arm images  demonstrate that the patient has undergone posterior fusion at L4-5.  Pedicle screws are present with posterior rods.  IMPRESSION: Posterior fusion performed at L4-5.   Original Report Authenticated By: Francene Boyers, M.D.          Follow-up Information   Follow up with Alvy Beal,  MD. Schedule an appointment as soon as possible for a visit in 2 weeks.   Specialty:  Orthopedic Surgery   Contact information:   [address] [place] [address] [phone]       Discharge Plan:  discharge to home  Disposition: stable    Signed: Venita Lick D for Dr. Venita Lick [facility] [phone] 04/19/2013, 9:55 AM

## 2013-05-06 NOTE — Progress Notes (Addendum)
SW received a consult for possible placement. PT  At this time is recommending home with [facility] and not SNF. Clinical Social Worker will sign off for now as social work intervention is no longer needed. Please consult [place] again if new need arises.   Sabino Niemann, MSW, [employer]  [phone]

## 2013-05-23 ENCOUNTER — Other Ambulatory Visit: Payer: Self-pay | Admitting: Cardiology

## 2013-05-27 ENCOUNTER — Other Ambulatory Visit: Payer: Self-pay | Admitting: Cardiology

## 2013-05-29 ENCOUNTER — Telehealth: Payer: Self-pay | Admitting: *Deleted

## 2013-05-29 ENCOUNTER — Other Ambulatory Visit: Payer: Self-pay | Admitting: *Deleted

## 2013-05-29 DIAGNOSIS — G4700 Insomnia, unspecified: Secondary | ICD-10-CM

## 2013-05-29 MED ORDER — TEMAZEPAM 30 MG PO CAPS: 30.0000 mg | ORAL_CAPSULE | Freq: Every evening | ORAL | Status: DC | PRN

## 2013-05-29 NOTE — Telephone Encounter (Signed)
error 

## 2013-05-29 NOTE — Telephone Encounter (Signed)
Called into pharmacy

## 2013-06-05 ENCOUNTER — Other Ambulatory Visit (INDEPENDENT_AMBULATORY_CARE_PROVIDER_SITE_OTHER): Payer: Medicare Other

## 2013-06-05 DIAGNOSIS — I119 Hypertensive heart disease without heart failure: Secondary | ICD-10-CM

## 2013-06-05 DIAGNOSIS — E785 Hyperlipidemia, unspecified: Secondary | ICD-10-CM

## 2013-06-05 LAB — BASIC METABOLIC PANEL
BUN: 13 mg/dL (ref 6–23)
CO2: 30 mEq/L (ref 19–32)
Chloride: 101 mEq/L (ref 96–112)
Creatinine, Ser: 0.8 mg/dL (ref 0.4–1.5)
Glucose, Bld: 135 mg/dL — ABNORMAL HIGH (ref 70–99)

## 2013-06-05 LAB — LIPID PANEL
Total CHOL/HDL Ratio: 4
Triglycerides: 256 mg/dL — ABNORMAL HIGH (ref 0.0–149.0)

## 2013-06-05 LAB — HEPATIC FUNCTION PANEL
AST: 22 U/L (ref 0–37)
Bilirubin, Direct: 0.1 mg/dL (ref 0.0–0.3)
Total Bilirubin: 0.7 mg/dL (ref 0.3–1.2)

## 2013-06-05 LAB — HEMOGLOBIN A1C: Hgb A1c MFr Bld: 6.8 % — ABNORMAL HIGH (ref 4.6–6.5)

## 2013-06-06 NOTE — Progress Notes (Signed)
Quick Note:  Please make copy of labs for patient visit. ______ 

## 2013-06-10 ENCOUNTER — Ambulatory Visit (INDEPENDENT_AMBULATORY_CARE_PROVIDER_SITE_OTHER): Payer: Medicare Other | Admitting: Cardiology

## 2013-06-10 ENCOUNTER — Encounter: Payer: Self-pay | Admitting: Cardiology

## 2013-06-10 VITALS — BP 162/64 | HR 69 | Ht 75.0 in | Wt 200.1 lb

## 2013-06-10 DIAGNOSIS — I359 Nonrheumatic aortic valve disorder, unspecified: Secondary | ICD-10-CM

## 2013-06-10 DIAGNOSIS — I119 Hypertensive heart disease without heart failure: Secondary | ICD-10-CM

## 2013-06-10 DIAGNOSIS — E785 Hyperlipidemia, unspecified: Secondary | ICD-10-CM

## 2013-06-10 DIAGNOSIS — I358 Other nonrheumatic aortic valve disorders: Secondary | ICD-10-CM

## 2013-06-10 MED ORDER — METFORMIN HCL 500 MG PO TABS: ORAL_TABLET | ORAL | Status: DC

## 2013-06-10 MED ORDER — LISINOPRIL 20 MG PO TABS: 20.0000 mg | ORAL_TABLET | Freq: Two times a day (BID) | ORAL | Status: DC

## 2013-06-10 NOTE — Assessment & Plan Note (Signed)
His blood pressure is elevated today and has been elevated at home.  We will increase his lisinopril up to 20 mg twice a day for better blood pressure control.

## 2013-06-10 NOTE — Progress Notes (Signed)
[name] Date of Birth:  03-20-1936 [address] [place], [place]  [address] [phone]         Fax   [phone]  History of Present Illness: This pleasant 77 year old gentleman is seen for a four-month followup office visit. He has a past history of essential hypertension, symptomatic PVCs, a past history of polymyalgia rheumatica, and a present history of severe rheumatoid arthritis. He is followed closely by his rheumatologist Dr. Dareen Piano. The patient does not have any history of ischemic heart disease. He has a known systolic heart murmur and his last echocardiogram 06/13/12 showed normal left ventricular systolic function and showed aortic valve sclerosis without stenosis.  Since last visit the patient has had extensive back surgery by Dr. Shon Baton.  Patient is now in physical therapy rehabilitation.  He is also continuing to have a lot of pain in his left knee and is hoping to see Dr. Charlann Boxer soon.   Current Outpatient Prescriptions  Medication Sig Dispense Refill  . amLODipine (NORVASC) 10 MG tablet Take 1 tablet (10 mg total) by mouth every morning.  90 tablet  3  . B Complex-C (B-COMPLEX WITH VITAMIN C) tablet Take 1 tablet by mouth 2 (two) times daily.      . Calcium Citrate-Vitamin D (CITRACAL + D PO) Take 1 tablet by mouth 2 (two) times daily.      . cholecalciferol (VITAMIN D) 1000 UNITS tablet Take 2,000 Units by mouth 2 (two) times daily.      Marland Kitchen docusate sodium (COLACE) 100 MG capsule Take 1 capsule (100 mg total) by mouth 3 (three) times daily as needed for constipation.  30 capsule  0  . DULoxetine (CYMBALTA) 60 MG capsule Take 60 mg by mouth at bedtime.       . ferrous sulfate 325 (65 FE) MG tablet Take 325 mg by mouth 2 (two) times daily.      Marland Kitchen HYDROcodone-acetaminophen (NORCO/VICODIN) 5-325 MG per tablet 10-325 MG      . leflunomide (ARAVA) 20 MG tablet Take 20 mg by mouth every morning.       Marland Kitchen lisinopril (PRINIVIL,ZESTRIL) 20 MG tablet Take 1  tablet (20 mg total) by mouth 2 (two) times daily.  180 tablet  3  . metFORMIN (GLUCOPHAGE) 500 MG tablet 2 TABLETS IN THE MORNING AND 1 TABLET IN THE EVENING  270 tablet  3  . methocarbamol (ROBAXIN) 500 MG tablet Take 1 tablet (500 mg total) by mouth 3 (three) times daily.  90 tablet  0  . Methylsulfonylmethane (MSM) 1000 MG CAPS Take 1,000 mg by mouth daily.      . Multiple Vitamin (MULTIVITAMIN WITH MINERALS) TABS Take 1 tablet by mouth 2 (two) times daily.      Marland Kitchen NASONEX 50 MCG/ACT nasal spray Place 1 spray into the nose daily as needed. Allergies      . ondansetron (ZOFRAN) 4 MG tablet Take 1 tablet (4 mg total) by mouth every 8 (eight) hours as needed for nausea.  20 tablet  0  . OxyCODONE (OXYCONTIN) 10 mg T12A 12 hr tablet Take 1 tablet (10 mg total) by mouth every 12 (twelve) hours.  28 tablet  0  . oxyCODONE-acetaminophen (PERCOCET) 10-325 MG per tablet Take 1 tablet by mouth every 4 (four) hours as needed for pain.  90 tablet  0  . oxymetazoline (AFRIN) 0.05 % nasal spray Place 2 sprays into the nose daily. As needed for congestion      .  polyethylene glycol powder (GLYCOLAX) powder Take 17 g by mouth daily.  255 g  1  . predniSONE (DELTASONE) 5 MG tablet Take 5 mg by mouth every morning.       . pregabalin (LYRICA) 75 MG capsule Take 75 mg by mouth 2 (two) times daily.      . tamsulosin (FLOMAX) 0.4 MG CAPS capsule Take 0.4 mg by mouth daily.      . temazepam (RESTORIL) 30 MG capsule TAKE ONE CAPSULE BY MOUTH AT BEDTIME  30 capsule  5   No current facility-administered medications for this visit.    Allergies  Allergen Reactions  . Sulfa Drugs Cross Reactors Other (See Comments)    Unknown (Pt stated he may or may not be allergic to this medication)     Patient Active Problem List   Diagnosis Date Noted  . Rheumatoid arthritis 07/10/2011  . Polymyalgia rheumatica 01/10/2011  . Benign hypertensive heart disease without heart failure 01/10/2011  . Dyslipidemia 01/10/2011    . Type II or unspecified type diabetes mellitus without mention of complication, uncontrolled 01/10/2011    History  Smoking status  . Never Smoker   Smokeless tobacco  . Never Used    History  Alcohol Use No    Family History  Problem Relation Age of Onset  . Heart disease Father     Review of Systems: Constitutional: no fever chills diaphoresis or fatigue or change in weight.  Head and neck: no hearing loss, no epistaxis, no photophobia or visual disturbance. Respiratory: No cough, shortness of breath or wheezing. Cardiovascular: No chest pain peripheral edema, palpitations. Gastrointestinal: No abdominal distention, no abdominal pain, no change in bowel habits hematochezia or melena. Genitourinary: No dysuria, no frequency, no urgency, no nocturia. Musculoskeletal:No arthralgias, no back pain, no gait disturbance or myalgias. Neurological: No dizziness, no headaches, no numbness, no seizures, no syncope, no weakness, no tremors. Hematologic: No lymphadenopathy, no easy bruising. Psychiatric: No confusion, no hallucinations, no sleep disturbance.    Physical Exam: Filed Vitals:   06/10/13 1532  BP: 162/64  Pulse: 69   general reveals an elderly gentleman in no acute distress.  He is having moderate chronic back pain.  He is using a rolling walker to ambulate.The head and neck exam reveals pupils equal and reactive.  Extraocular movements are full.  There is no scleral icterus.  The mouth and pharynx are normal.  The neck is supple.  The carotids reveal no bruits.  The jugular venous pressure is normal.  The  thyroid is not enlarged.  There is no lymphadenopathy.  The chest is clear to percussion and auscultation.  There are no rales or rhonchi.  Expansion of the chest is symmetrical.  The precordium is quiet.  The first heart sound is normal.  The second heart sound is physiologically split.  There is no gallop rub or click.  There is a grade 2/6 harsh systolic ejection  murmur of aortic valve sclerosis at the left sternal edge and base.  No diastolic murmur  There is no abnormal lift or heave.  The abdomen is soft and nontender.  The bowel sounds are normal.  The liver and spleen are not enlarged.  There are no abdominal masses.  There are no abdominal bruits.  Extremities reveal good pedal pulses.  There is no phlebitis or edema.  There is no cyanosis or clubbing.  Strength is normal and symmetrical in all extremities.  There is no lateralizing weakness.  There are no sensory  deficits.  The skin is warm and dry.  There is no rash.     Assessment / Plan: The patient is to increase his lisinopril and his metformin as noted above.  He'll be rechecked in 4 months for followup office visit A1c lipid panel hepatic function panel and basal metabolic panel.

## 2013-06-10 NOTE — Assessment & Plan Note (Signed)
The patient is not having any hypoglycemic episodes.  His hemoglobin A1c is still running high at 6.8.  We will increase his metformin up to 1000 in the morning and 500 in the evening

## 2013-06-10 NOTE — Assessment & Plan Note (Signed)
The patient has a history of dyslipidemia.  He is not presently on any statin therapy.  He has been experiencing leg weakness in both thighs and he states that Dr. Ethelene Hal has checked his strength and has found myopathy.

## 2013-06-10 NOTE — Patient Instructions (Signed)
INCREASE YOUR METFORMIN 500 MG TO 2 IN THE MORNING AND 1 IN THE EVENING  INCREASE YOUR LISINOPRIL TO 20 MG TWICE A DAY   Your physician wants you to follow-up in: 4 months with fasting labs (lp/bmet/hfp/A1C) You will receive a reminder letter in the mail two months in advance. If you don't receive a letter, please call our office to schedule the follow-up appointment.

## 2013-06-19 ENCOUNTER — Other Ambulatory Visit: Payer: Self-pay

## 2013-07-17 ENCOUNTER — Other Ambulatory Visit: Payer: Self-pay | Admitting: Orthopedic Surgery

## 2013-07-17 DIAGNOSIS — IMO0002 Reserved for concepts with insufficient information to code with codable children: Secondary | ICD-10-CM

## 2013-07-21 ENCOUNTER — Ambulatory Visit
Admission: RE | Admit: 2013-07-21 | Discharge: 2013-07-21 | Disposition: A | Discharge status: Home / Self Care | Payer: Medicare Other | Source: Ambulatory Visit | Attending: Orthopedic Surgery | Admitting: Orthopedic Surgery

## 2013-07-21 DIAGNOSIS — IMO0002 Reserved for concepts with insufficient information to code with codable children: Secondary | ICD-10-CM

## 2013-07-24 ENCOUNTER — Telehealth: Payer: Self-pay | Admitting: Cardiology

## 2013-07-24 NOTE — Telephone Encounter (Signed)
Informed pt that papers for surgical clearance were received and will be given to [facility] when she is back on Friday per the previous phone note dated 12/11, 8:14am. He is waiting for clearance to have repair of torn meniscus.

## 2013-07-24 NOTE — Telephone Encounter (Signed)
Walk in pt Form " [facility]" surgical Clearance Dropped Off Melinda Back Friday will give to Her then

## 2013-07-24 NOTE — Telephone Encounter (Signed)
New problem    Pt want to see if his surgical release from was received. Please call pt.

## 2013-07-25 NOTE — Telephone Encounter (Signed)
Advised patient forms received, pending  Dr Yevonne Pax approval. Will call him if there is a problem

## 2013-07-28 ENCOUNTER — Telehealth: Payer: Self-pay | Admitting: Cardiology

## 2013-07-28 NOTE — Telephone Encounter (Signed)
Received request from Nurse fax box, documents faxed for surgical clearance. To: [facility] Fax number: [phone] Attention: 07/28/13/km

## 2013-08-02 ENCOUNTER — Telehealth: Payer: Self-pay | Admitting: Physician Assistant

## 2013-08-02 ENCOUNTER — Emergency Department (HOSPITAL_COMMUNITY)
Admission: EM | Admit: 2013-08-02 | Discharge: 2013-08-02 | Disposition: A | Discharge status: Home / Self Care | Payer: Medicare Other | Attending: Emergency Medicine | Admitting: Emergency Medicine

## 2013-08-02 ENCOUNTER — Emergency Department (HOSPITAL_COMMUNITY): Payer: Medicare Other

## 2013-08-02 ENCOUNTER — Encounter (HOSPITAL_COMMUNITY): Payer: Self-pay | Admitting: Emergency Medicine

## 2013-08-02 DIAGNOSIS — IMO0002 Reserved for concepts with insufficient information to code with codable children: Secondary | ICD-10-CM | POA: Insufficient documentation

## 2013-08-02 DIAGNOSIS — G8929 Other chronic pain: Secondary | ICD-10-CM | POA: Insufficient documentation

## 2013-08-02 DIAGNOSIS — F411 Generalized anxiety disorder: Secondary | ICD-10-CM | POA: Insufficient documentation

## 2013-08-02 DIAGNOSIS — E119 Type 2 diabetes mellitus without complications: Secondary | ICD-10-CM | POA: Insufficient documentation

## 2013-08-02 DIAGNOSIS — Z95818 Presence of other cardiac implants and grafts: Secondary | ICD-10-CM | POA: Insufficient documentation

## 2013-08-02 DIAGNOSIS — R110 Nausea: Secondary | ICD-10-CM | POA: Insufficient documentation

## 2013-08-02 DIAGNOSIS — M069 Rheumatoid arthritis, unspecified: Secondary | ICD-10-CM | POA: Insufficient documentation

## 2013-08-02 DIAGNOSIS — R51 Headache: Secondary | ICD-10-CM | POA: Insufficient documentation

## 2013-08-02 DIAGNOSIS — I10 Essential (primary) hypertension: Secondary | ICD-10-CM | POA: Insufficient documentation

## 2013-08-02 DIAGNOSIS — Z79899 Other long term (current) drug therapy: Secondary | ICD-10-CM | POA: Insufficient documentation

## 2013-08-02 DIAGNOSIS — I2510 Atherosclerotic heart disease of native coronary artery without angina pectoris: Secondary | ICD-10-CM | POA: Insufficient documentation

## 2013-08-02 DIAGNOSIS — Z87448 Personal history of other diseases of urinary system: Secondary | ICD-10-CM | POA: Insufficient documentation

## 2013-08-02 DIAGNOSIS — F419 Anxiety disorder, unspecified: Secondary | ICD-10-CM

## 2013-08-02 DIAGNOSIS — Z9889 Other specified postprocedural states: Secondary | ICD-10-CM | POA: Insufficient documentation

## 2013-08-02 DIAGNOSIS — M25569 Pain in unspecified knee: Secondary | ICD-10-CM | POA: Insufficient documentation

## 2013-08-02 DIAGNOSIS — R011 Cardiac murmur, unspecified: Secondary | ICD-10-CM | POA: Insufficient documentation

## 2013-08-02 DIAGNOSIS — D649 Anemia, unspecified: Secondary | ICD-10-CM | POA: Insufficient documentation

## 2013-08-02 LAB — CBC WITH DIFFERENTIAL/PLATELET
Eosinophils Absolute: 0.1 10*3/uL (ref 0.0–0.7)
Hemoglobin: 13 g/dL (ref 13.0–17.0)
Lymphs Abs: 0.9 10*3/uL (ref 0.7–4.0)
MCH: 28.1 pg (ref 26.0–34.0)
Monocytes Relative: 13 % — ABNORMAL HIGH (ref 3–12)
Neutro Abs: 3.7 10*3/uL (ref 1.7–7.7)
Neutrophils Relative %: 68 % (ref 43–77)
RBC: 4.63 MIL/uL (ref 4.22–5.81)

## 2013-08-02 LAB — URINALYSIS, ROUTINE W REFLEX MICROSCOPIC
Ketones, ur: NEGATIVE mg/dL
Leukocytes, UA: NEGATIVE
Nitrite: NEGATIVE
Protein, ur: NEGATIVE mg/dL

## 2013-08-02 LAB — COMPREHENSIVE METABOLIC PANEL
Alkaline Phosphatase: 66 U/L (ref 39–117)
BUN: 12 mg/dL (ref 6–23)
Chloride: 100 mEq/L (ref 96–112)
GFR calc Af Amer: >90 mL/min (ref >90)
GFR calc non Af Amer: >90 mL/min (ref >90)
Glucose, Bld: 127 mg/dL — ABNORMAL HIGH (ref 70–99)
Potassium: 3.1 mEq/L — ABNORMAL LOW (ref 3.5–5.1)
Total Bilirubin: 0.5 mg/dL (ref 0.3–1.2)

## 2013-08-02 MED ORDER — AMLODIPINE BESYLATE 10 MG PO TABS
10.0000 mg | ORAL_TABLET | Freq: Once | ORAL | Status: AC
  Administered 2013-08-02: 10 mg via ORAL
  Filled 2013-08-02: qty 1

## 2013-08-02 MED ORDER — LORAZEPAM 2 MG/ML IJ SOLN
0.5000 mg | Freq: Once | INTRAMUSCULAR | Status: AC
  Administered 2013-08-02: 0.5 mg via INTRAVENOUS
  Filled 2013-08-02: qty 1

## 2013-08-02 MED ORDER — OXYCODONE-ACETAMINOPHEN 5-325 MG PO TABS
2.0000 | ORAL_TABLET | Freq: Once | ORAL | Status: AC
  Administered 2013-08-02: 2 via ORAL
  Filled 2013-08-02: qty 2

## 2013-08-02 MED ORDER — POTASSIUM CHLORIDE CRYS ER 20 MEQ PO TBCR
20.0000 meq | EXTENDED_RELEASE_TABLET | Freq: Once | ORAL | Status: AC
  Administered 2013-08-02: 20 meq via ORAL
  Filled 2013-08-02: qty 1

## 2013-08-02 MED ORDER — SODIUM CHLORIDE 0.9 % IV BOLUS (SEPSIS)
1000.0000 mL | Freq: Once | INTRAVENOUS | Status: AC
  Administered 2013-08-02: 1000 mL via INTRAVENOUS

## 2013-08-02 NOTE — Telephone Encounter (Addendum)
Pt called the answering service on Saturday very anxious about his BP. Last night his BP 200/94, P74. He stayed up all night last night because he was worried about what he read up on the web. He kept monitoring his BP all night long, generally ranging 190-200 systolic. He feels very jittery. He normally does not have this kind of blood pressure problems. He took lisinopril last night which brought BP down to the 160 range. This morning however he does not feel well. He reports a slight headache, jitteriness, and stomach upset. No CP or SOB reported or other acute symptoms. He initially thought he had a stomach bug the last several days with mild diarrhea but is now concerned about this blood pressure. He was citing studies he read online about hypertension. I recommended he go to UC vs ER for in-person evaluation. He knows not to drive himself. He verbalized understanding and gratitude. Tanay Misuraca PA-C

## 2013-08-02 NOTE — ED Notes (Signed)
Pt from home, Has multiple complaints. Pt states abd pain w/ nausea,HA, jittery and anxious starting last night. States he had recent GI virus w/ diarrhea but has cleared. Pt states increase in anxiety last night, took a celebrex @ 3am. Per EMS pt was hypertensive 200/100

## 2013-08-02 NOTE — Telephone Encounter (Signed)
Agree with advice given. Thanks.

## 2013-08-02 NOTE — ED Notes (Signed)
Dr. Plunkett at bedside.  

## 2013-08-02 NOTE — ED Provider Notes (Signed)
CSN: [id number]     Arrival date & time 08/02/13  1157 History   First MD Initiated Contact with Patient 08/02/13 1212     Chief Complaint  Patient presents with  . Abdominal Pain   (Consider location/radiation/quality/duration/timing/severity/associated sxs/prior Treatment) HPI Comments: Pt here with multiple complaints.  He states last week he had the stomach bug with N/V/D which has resolved but continues to be nauseated and have decreased appetite with mild periumbilical abdominal pain. His bowel movements have gone back to normal and he is eating but states food does not taste the same.  Secondly he states that he has had a long battle with severe knee pain swelling and recurrent back surgeries. He is scheduled for surgery on his left knee on January 5 but states until last week he did not feel that he was taken seriously and his pain was not controlled adequately. Last week he saw his rheumatologist who prescribed oxycodone 10/325mg  4 times a day and he states he finally had some relief. However he states in all of that since his back surgery and with his knee problems he is developed anxiety where he feels jittery, and easy and generally not well.  Patient states symptoms have been building up but became acutely worse last night. He was unable to sleep at all last night he was checking his blood pressure every 30 minutes and rate remained elevated between 170/80-200/100. He states during that time he continued to feel jittery, headache, nausea and a general sense of not feeling well. He took a Celebrex which he states is for anxiety at 3 without improvement. Because things were not improving he he came in for further evaluation late this morning.  The history is provided by the patient.    Past Medical History  Diagnosis Date  . Fatigue   . Aortic valve disease     SCLEROSIS WITHOUT STENOSIS  . Hyperlipidemia   . PVC's (premature ventricular contractions)   . Sciatica   . Anemia   .  Coronary artery disease CARDOLOGIST- DR BRACKBILL    cardiac catheterization 2005 / EF 50%  . Diabetes mellitus, type 2   . DDD (degenerative disc disease)   . RA (rheumatoid arthritis) RHEUMATOLOGIST-  DR Dareen Piano    SEVERE  . Chronic low back pain     CHRONIC NARCOTIC USE  . Systolic murmur   . Anxiety   . Hypertension     dr Patty Sermons  . BPH (benign prostatic hyperplasia)    Past Surgical History  Procedure Laterality Date  . Retinal detachment surgery Left 2001  . Decompressive lumbar laminectomy level 3  07/17/2012    Procedure: DECOMPRESSIVE LUMBAR LAMINECTOMY LEVEL 3;  Surgeon: Drucilla Schmidt, MD;  Location: [facility];  Service: Orthopedics;  Laterality: N/A;  L3-4, L4-5 AND L5-S1  . Right hydrocelectomy/ spermatocelectomy  09-11-2001  . Carpal tunnel release Right 09-24-2006  . Open repair left quadriceps tendon  11-17-2007  . Lumbar spine surgery  10-07-2008    DECOMPRESSION   L2  - S1  . Left index / long fingers i & d , repair tendon, and pinning  09-18-2010  . Ankle arthrodesis Right 01-11-2011  . Cardiac catheterization  2005    EF 50% /  showed minimal coronary atherosclerosisi   . Hammer toe surgery      RIGHT SECOND TOE  . Transthoracic echocardiogram  06-13-2012  DR BRACKBILL    MILD LVH/ LVSF NORMAL/ EF 55-60%/ GRADE I DIASTOLIC DYSFUNCTION/  MILD  AV AND MV REGURG/ AORTIC VALVE SCLEROSIS WITHOUT STENOSIS  . Hardware removal Right 10/17/2012    Procedure: HARDWARE REMOVAL;  Surgeon: Drucilla Schmidt, MD;  Location: [facility];  Service: Orthopedics;  Laterality: Right;  REMOVE SCREW RIGHT ANKLE  . Anterior cervical decomp/discectomy fusion N/A 04/16/2013    Procedure: L4 - L5 REVISION DECOMPRESSION AND FUSION 1 LEVEL;  Surgeon: Venita Lick, MD;  Location: [facility];  Service: Orthopedics;  Laterality: N/A;   Family History  Problem Relation Age of Onset  . Heart disease Father    History  Substance Use Topics  . Smoking status: Never Smoker   .  Smokeless tobacco: Never Used  . Alcohol Use: No    Review of Systems  All other systems reviewed and are negative.    Allergies  Sulfa drugs cross reactors  Home Medications   Current Outpatient Rx  Name  Route  Sig  Dispense  Refill  . amLODipine (NORVASC) 10 MG tablet   Oral   Take 1 tablet (10 mg total) by mouth every morning.   90 tablet   3   . B Complex-C (B-COMPLEX WITH VITAMIN C) tablet   Oral   Take 1 tablet by mouth 2 (two) times daily.         . Calcium Citrate-Vitamin D (CITRACAL + D PO)   Oral   Take 1 tablet by mouth 2 (two) times daily.         . cholecalciferol (VITAMIN D) 1000 UNITS tablet   Oral   Take 2,000 Units by mouth 2 (two) times daily.         Marland Kitchen docusate sodium (COLACE) 100 MG capsule   Oral   Take 1 capsule (100 mg total) by mouth 3 (three) times daily as needed for constipation.   30 capsule   0   . DULoxetine (CYMBALTA) 60 MG capsule   Oral   Take 60 mg by mouth daily.          . fentaNYL (DURAGESIC - DOSED MCG/HR) 25 MCG/HR patch   Transdermal   Place 25 mcg onto the skin every 3 (three) days.         . ferrous sulfate 325 (65 FE) MG tablet   Oral   Take 325 mg by mouth 2 (two) times daily.         Marland Kitchen HYDROcodone-acetaminophen (NORCO/VICODIN) 5-325 MG per tablet   Oral   Take 1 tablet by mouth every 6 (six) hours as needed for moderate pain. 10-325 MG         . leflunomide (ARAVA) 20 MG tablet   Oral   Take 20 mg by mouth every morning.          Marland Kitchen lisinopril (PRINIVIL,ZESTRIL) 20 MG tablet   Oral   Take 1 tablet (20 mg total) by mouth 2 (two) times daily.   180 tablet   3     NEW DOSE   . metFORMIN (GLUCOPHAGE) 500 MG tablet   Oral   Take 500-1,000 mg by mouth 2 (two) times daily with a meal. Take 2 tablets in AM and 1 tablet in PM         . methocarbamol (ROBAXIN) 500 MG tablet   Oral   Take 1 tablet (500 mg total) by mouth 3 (three) times daily.   90 tablet   0   . Methylsulfonylmethane  (MSM) 1000 MG CAPS   Oral   Take 1,000 mg by mouth daily.         Marland Kitchen  Multiple Vitamin (MULTIVITAMIN WITH MINERALS) TABS   Oral   Take 1 tablet by mouth 2 (two) times daily.         Marland Kitchen NASONEX 50 MCG/ACT nasal spray   Nasal   Place 1 spray into the nose daily as needed. Allergies         . oxymetazoline (AFRIN) 0.05 % nasal spray   Nasal   Place 2 sprays into the nose daily. As needed for congestion         . predniSONE (DELTASONE) 5 MG tablet   Oral   Take 5 mg by mouth every morning.          . tamsulosin (FLOMAX) 0.4 MG CAPS capsule   Oral   Take 0.8 mg by mouth daily.          . temazepam (RESTORIL) 30 MG capsule   Oral   Take 30 mg by mouth at bedtime as needed for sleep.          BP 170/82  Pulse 71  Temp(Src) 98.5 F (36.9 C) (Oral)  Resp 22  SpO2 95% Physical Exam  Nursing note and vitals reviewed. Constitutional: He is oriented to person, place, and time. He appears well-developed and well-nourished. No distress.  Anxious appearing  HENT:  Head: Normocephalic and atraumatic.  Mouth/Throat: Oropharynx is clear and moist. Mucous membranes are dry.  Eyes: Conjunctivae and EOM are normal. Pupils are equal, round, and reactive to light.  Neck: Normal range of motion. Neck supple.  Cardiovascular: Normal rate, regular rhythm and intact distal pulses.   Murmur heard. Pulmonary/Chest: Effort normal and breath sounds normal. No respiratory distress. He has no wheezes. He has no rales.  Abdominal: Soft. He exhibits no distension. There is no tenderness. There is no rebound and no guarding.  Musculoskeletal: Normal range of motion. He exhibits tenderness. He exhibits no edema.  Left knee is propped up on a pillow and he describes pain in his knee. Due to his ongoing knee problems the knee was not ranged  Neurological: He is alert and oriented to person, place, and time.  Skin: Skin is warm and dry. No rash noted. No erythema.  Psychiatric: He has a normal  mood and affect. His behavior is normal.    ED Course  Procedures (including critical care time) Labs Review Labs Reviewed  CBC WITH DIFFERENTIAL - Abnormal; Notable for the following:    Monocytes Relative 13 (*)    All other components within normal limits  COMPREHENSIVE METABOLIC PANEL - Abnormal; Notable for the following:    Potassium 3.1 (*)    Glucose, Bld 127 (*)    All other components within normal limits  URINALYSIS, ROUTINE W REFLEX MICROSCOPIC  POCT I-STAT TROPONIN I   Imaging Review Dg Chest Port 1 View  08/02/2013   CLINICAL DATA:  Palpitations.  EXAM: PORTABLE CHEST - 1 VIEW  COMPARISON:  Chest x-ray of July 08, 2012  FINDINGS: The lungs are mildly hyperinflated. There is no focal infiltrate. The interstitial markings are not increased. The cardiac silhouette is normal in size. The pulmonary vascularity is not engorged. The mediastinum is normal in width. There is no pleural effusion. The bony thorax exhibits no acute abnormality.  IMPRESSION: There is no evidence of pneumonia nor CHF nor other acute cardiopulmonary abnormality. Mild hyperinflation may be voluntary or may reflect underlying COPD in the appropriate clinical setting.   Electronically Signed   By: David  [place]   On: 08/02/2013 12:56  EKG Interpretation    Date/Time:  Saturday August 02 2013 12:04:26 EST Ventricular Rate:  65 PR Interval:  118 QRS Duration: 102 QT Interval:  411 QTC Calculation: 427 R Axis:   104 Text Interpretation:  Sinus rhythm Supraventricular bigeminy Borderline short PR interval Right axis deviation Borderline repolarization abnormality No significant change since last tracing Confirmed by Anitra Lauth  MD, Rocio Roam ([id number]) on 08/02/2013 12:11:09 PM            MDM   1. Anxiety   2. Hypertension     Patient is here with multiple vague complaints. He states approximately 24 hours ago he started to feel jittery but it became severe early this morning. He complains of  feeling jittery, anxious and in general sense of being not well. He was checking his blood pressure at home and it was ranging between 170/80-200/100. He states in the past he has had issues with pain control due to her severe pain in his knee and in the last week he is actually being given new medication that is helping him control his pain. However he states given all these issues with his pain management it has caused him to be anxious. He complains of nausea, mild headache and mild. Umbilical abdominal pain. Last week he had nausea vomiting and diarrhea which subsided but his appetite has not returned.  Today patient's EKG has some artifact but otherwise shows sinus rhythm with right axis deviation but overall unchanged from prior images.  Chest x-ray within normal limits. There are no focal deficits on exam. Patient does seem a bit anxious upon exam.  Patient's poor appetite and recent GI illness he was given IV fluids. Also patient given Ativan and his regular pain medication.  CBC, CMP, troponin, UA pending.  3:28 PM Labs wnl.  On reeval pt feeling better after meds.  Mild hypokalemia which was replaced.  Pt also has not taken amlodipine today and given dose.  Will d/c home.    Gwyneth Sprout, MD 08/02/13 [phone]

## 2013-08-06 ENCOUNTER — Encounter: Payer: Self-pay | Admitting: Cardiology

## 2013-08-06 ENCOUNTER — Ambulatory Visit (INDEPENDENT_AMBULATORY_CARE_PROVIDER_SITE_OTHER): Payer: Medicare Other | Admitting: Cardiology

## 2013-08-06 VITALS — BP 168/70 | HR 61 | Ht 75.0 in | Wt 202.0 lb

## 2013-08-06 DIAGNOSIS — F411 Generalized anxiety disorder: Secondary | ICD-10-CM

## 2013-08-06 DIAGNOSIS — E876 Hypokalemia: Secondary | ICD-10-CM | POA: Insufficient documentation

## 2013-08-06 DIAGNOSIS — I119 Hypertensive heart disease without heart failure: Secondary | ICD-10-CM

## 2013-08-06 LAB — BASIC METABOLIC PANEL
BUN: 11 mg/dL (ref 6–23)
Calcium: 9.1 mg/dL (ref 8.4–10.5)
Chloride: 105 mEq/L (ref 96–112)
Creatinine, Ser: 0.7 mg/dL (ref 0.4–1.5)
GFR: 118.7 mL/min (ref >60.00)

## 2013-08-06 MED ORDER — METOPROLOL SUCCINATE ER 25 MG PO TB24: 25.0000 mg | ORAL_TABLET | Freq: Every day | ORAL | Status: DC

## 2013-08-06 NOTE — Assessment & Plan Note (Signed)
The patient remains extremely anxious.  He is not resting well.  His pain management is under the direction of his rheumatologist now.  He will be seeing his PCP Dr. Lenise Arena soon for further management of his anxiety.

## 2013-08-06 NOTE — Assessment & Plan Note (Signed)
Since his emergency room visit his systolic blood pressure has remained high.  We will continue current medication and add Toprol XL 25 one daily.  A lot of his hypertension is secondary to his anxiety.

## 2013-08-06 NOTE — Assessment & Plan Note (Addendum)
In the emergency room the patient's potassium was low at 3.1.  The patient is not on a diuretic.  He is on an ACE inhibitor.  We will recheck his potassium today.  He is on low-dose prednisone which may be affecting his potassium

## 2013-08-06 NOTE — Progress Notes (Signed)
[name] Date of Birth:  Dec 30, 1935 [address] [place], [place]  [address] [phone]         Fax   [phone]  History of Present Illness: This pleasant 77 year old gentleman is seen for a post emergency room office visit.  He was seen in the emergency room on 08/02/13 with uncontrolled hypertension and acute anxiety.  He had stayed up all night the previous night checking his blood pressure.  He has had worsening problems with anxiety and frustration dealing with multiple orthopedic problems including severe knee pain.  He is scheduled for knee surgery on his left knee after January 1 He has a past history of essential hypertension, symptomatic PVCs, a past history of polymyalgia rheumatica, and a present history of severe rheumatoid arthritis. He is followed closely by his rheumatologist Dr. Dareen Piano. The patient does not have any history of ischemic heart disease. He has a known systolic heart murmur and his last echocardiogram 06/13/12 showed normal left ventricular systolic function and showed aortic valve sclerosis without stenosis.  Dr. Shon Baton is his back surgeon and Dr. Victorino Dike is his right foot surgeon and Dr. Jillyn Hidden will be operating on his left knee.  Current Outpatient Prescriptions  Medication Sig Dispense Refill  . amLODipine (NORVASC) 10 MG tablet Take 1 tablet (10 mg total) by mouth every morning.  90 tablet  3  . B Complex-C (B-COMPLEX WITH VITAMIN C) tablet Take 1 tablet by mouth 2 (two) times daily.      . Calcium Citrate-Vitamin D (CITRACAL + D PO) Take 1 tablet by mouth 2 (two) times daily.      . cholecalciferol (VITAMIN D) 1000 UNITS tablet Take 2,000 Units by mouth 2 (two) times daily.      . DULoxetine (CYMBALTA) 60 MG capsule Take 60 mg by mouth daily.       . fentaNYL (DURAGESIC - DOSED MCG/HR) 25 MCG/HR patch Place 25 mcg onto the skin every 3 (three) days.      Marland Kitchen leflunomide (ARAVA) 20 MG tablet Take 20 mg by mouth every morning.       Marland Kitchen  lisinopril (PRINIVIL,ZESTRIL) 20 MG tablet Take 1 tablet (20 mg total) by mouth 2 (two) times daily.  180 tablet  3  . metFORMIN (GLUCOPHAGE) 500 MG tablet Take 500-1,000 mg by mouth 2 (two) times daily with a meal. Take 2 tablets in AM and 1 tablet in PM      . methocarbamol (ROBAXIN) 500 MG tablet Take 1 tablet (500 mg total) by mouth 3 (three) times daily.  90 tablet  0  . Methylsulfonylmethane (MSM) 1000 MG CAPS Take 1,000 mg by mouth daily.      . Multiple Vitamin (MULTIVITAMIN WITH MINERALS) TABS Take 1 tablet by mouth 2 (two) times daily.      Marland Kitchen NASONEX 50 MCG/ACT nasal spray Place 1 spray into the nose daily as needed. Allergies      . oxymetazoline (AFRIN) 0.05 % nasal spray Place 2 sprays into the nose daily. As needed for congestion      . predniSONE (DELTASONE) 5 MG tablet Take 5 mg by mouth every morning.       . tamsulosin (FLOMAX) 0.4 MG CAPS capsule Take 0.8 mg by mouth daily.       . temazepam (RESTORIL) 30 MG capsule Take 30 mg by mouth at bedtime as needed for sleep.      . metoprolol succinate (TOPROL XL) 25 MG 24 hr tablet  Take 1 tablet (25 mg total) by mouth daily.  30 tablet  5  . oxyCODONE-acetaminophen (PERCOCET) 10-325 MG per tablet TAKE ONE TABLET BY MOUTH EVERY 6 HRS AS NEEDED FOR MODERATE PAIN.       No current facility-administered medications for this visit.    Allergies  Allergen Reactions  . Sulfa Drugs Cross Reactors Other (See Comments)    Unknown (Pt stated he may or may not be allergic to this medication)     Patient Active Problem List   Diagnosis Date Noted  . Rheumatoid arthritis 07/10/2011  . Polymyalgia rheumatica 01/10/2011  . Benign hypertensive heart disease without heart failure 01/10/2011  . Dyslipidemia 01/10/2011  . Type II or unspecified type diabetes mellitus without mention of complication, uncontrolled 01/10/2011    History  Smoking status  . Never Smoker   Smokeless tobacco  . Never Used    History  Alcohol Use No     Family History  Problem Relation Age of Onset  . Heart disease Father     Review of Systems: Constitutional: no fever chills diaphoresis or fatigue or change in weight.  Head and neck: no hearing loss, no epistaxis, no photophobia or visual disturbance. Respiratory: No cough, shortness of breath or wheezing. Cardiovascular: No chest pain peripheral edema, palpitations. Gastrointestinal: No abdominal distention, no abdominal pain, no change in bowel habits hematochezia or melena. Genitourinary: No dysuria, no frequency, no urgency, no nocturia. Musculoskeletal:No arthralgias, no back pain, no gait disturbance or myalgias. Neurological: No dizziness, no headaches, no numbness, no seizures, no syncope, no weakness, no tremors. Hematologic: No lymphadenopathy, no easy bruising. Psychiatric: No confusion, no hallucinations, no sleep disturbance.    Physical Exam: Filed Vitals:   08/06/13 1049  BP: 168/70  Pulse: 61   general reveals an elderly gentleman in no acute distress.  He is having moderate chronic back pain.  He is using a rolling walker to ambulate.The head and neck exam reveals pupils equal and reactive.  Extraocular movements are full.  There is no scleral icterus.  The mouth and pharynx are normal.  The neck is supple.  The carotids reveal no bruits.  The jugular venous pressure is normal.  The  thyroid is not enlarged.  There is no lymphadenopathy.  The chest is clear to percussion and auscultation.  There are no rales or rhonchi.  Expansion of the chest is symmetrical.  The precordium is quiet.  The first heart sound is normal.  The second heart sound is physiologically split.  There is no gallop rub or click.  There is a grade 2/6 harsh systolic ejection murmur of aortic valve sclerosis at the left sternal edge and base.  No diastolic murmur  There is no abnormal lift or heave.  The abdomen is soft and nontender.  The bowel sounds are normal.  The liver and spleen are not  enlarged.  There are no abdominal masses.  There are no abdominal bruits.  Extremities reveal good pedal pulses.  There is no phlebitis or edema.  There is no cyanosis or clubbing.  Strength is normal and symmetrical in all extremities.  There is no lateralizing weakness.  There are no sensory deficits.  The skin is warm and dry.  There is no rash.     Assessment / Plan: Continue same medication.  Add metoprolol succinate 25 mg daily.  Keep regular appointment in late February for followup visit here.  Short term he will see Dr. Lenise Arena soon about his anxiety.

## 2013-08-06 NOTE — Patient Instructions (Addendum)
START TOPROL XL 25 MG DAILY, RX SENT TO PHARMACY  Follow up with your Primary Care Physician regarding anxiety medications  Return in February

## 2013-08-10 NOTE — Progress Notes (Signed)
Quick Note:  Please report to patient. The recent labs are stable. Continue same medication and careful diet. Potassium is up to 3.5 normal now. Eat plenty of high potassium foods. ______

## 2013-08-12 ENCOUNTER — Telehealth: Payer: Self-pay | Admitting: *Deleted

## 2013-08-12 ENCOUNTER — Telehealth: Payer: Self-pay | Admitting: Cardiology

## 2013-08-12 NOTE — Telephone Encounter (Signed)
New message ° ° ° ° °Want test results °

## 2013-08-12 NOTE — Telephone Encounter (Signed)
Mailed copy of labs and left message to call if any questions  

## 2013-08-12 NOTE — Telephone Encounter (Signed)
Advised patient of lab results  

## 2013-08-12 NOTE — Telephone Encounter (Signed)
Message copied by Burnell Blanks on Tue Aug 12, 2013 10:29 AM ------      Message from: Cassell Clement      Created: Sun Aug 10, 2013  7:09 PM       Please report to patient.  The recent labs are stable. Continue same medication and careful diet. Potassium is up to 3.5 normal now. Eat plenty of high potassium foods. ------

## 2013-08-20 ENCOUNTER — Telehealth: Payer: Self-pay

## 2013-08-20 NOTE — Telephone Encounter (Signed)
Advised patient, will call back with update  

## 2013-08-20 NOTE — Telephone Encounter (Signed)
Increase lisinopril to 20 mg TID for better BP contro. Also try decreasing Toprol to just 12.5 mg daily

## 2013-08-20 NOTE — Telephone Encounter (Signed)
Patient c/o that Toprol makes him feel "strange", weak, dull headache and just does not agree with him. Does check his blood pressure at home and up in the 170's. Did have surgical procedure on Monday. Will forward to  Dr. Patty Sermons for review

## 2013-11-15 ENCOUNTER — Other Ambulatory Visit: Payer: Self-pay | Admitting: Cardiology

## 2013-11-15 DIAGNOSIS — G4700 Insomnia, unspecified: Secondary | ICD-10-CM

## 2013-12-15 ENCOUNTER — Other Ambulatory Visit (INDEPENDENT_AMBULATORY_CARE_PROVIDER_SITE_OTHER): Payer: Medicare Other

## 2013-12-15 DIAGNOSIS — E785 Hyperlipidemia, unspecified: Secondary | ICD-10-CM

## 2013-12-15 DIAGNOSIS — I119 Hypertensive heart disease without heart failure: Secondary | ICD-10-CM

## 2013-12-15 DIAGNOSIS — IMO0001 Reserved for inherently not codable concepts without codable children: Secondary | ICD-10-CM

## 2013-12-15 DIAGNOSIS — E1165 Type 2 diabetes mellitus with hyperglycemia: Secondary | ICD-10-CM

## 2013-12-15 LAB — HEMOGLOBIN A1C: Hgb A1c MFr Bld: 6.7 % — ABNORMAL HIGH (ref 4.6–6.5)

## 2013-12-15 LAB — HEPATIC FUNCTION PANEL
ALT: 18 U/L (ref 0–53)
AST: 26 U/L (ref 0–37)
Albumin: 3.7 g/dL (ref 3.5–5.2)
Alkaline Phosphatase: 54 U/L (ref 39–117)
Bilirubin, Direct: 0.1 mg/dL (ref 0.0–0.3)
Total Bilirubin: 0.4 mg/dL (ref 0.2–1.2)
Total Protein: 6.7 g/dL (ref 6.0–8.3)

## 2013-12-15 LAB — LIPID PANEL
CHOL/HDL RATIO: 4
Cholesterol: 152 mg/dL (ref 0–200)
HDL: 39.9 mg/dL (ref >39.00)
LDL CALC: 72 mg/dL (ref 0–99)
Triglycerides: 201 mg/dL — ABNORMAL HIGH (ref 0.0–149.0)
VLDL: 40.2 mg/dL — AB (ref 0.0–40.0)

## 2013-12-15 LAB — BASIC METABOLIC PANEL
BUN: 15 mg/dL (ref 6–23)
CHLORIDE: 102 meq/L (ref 96–112)
CO2: 30 meq/L (ref 19–32)
Calcium: 9.2 mg/dL (ref 8.4–10.5)
Creatinine, Ser: 0.8 mg/dL (ref 0.4–1.5)
GFR: 94.51 mL/min (ref >60.00)
GLUCOSE: 154 mg/dL — AB (ref 70–99)
POTASSIUM: 3.8 meq/L (ref 3.5–5.1)
SODIUM: 141 meq/L (ref 135–145)

## 2013-12-15 NOTE — Progress Notes (Signed)
Quick Note:  Please make copy of labs for patient visit. ______ 

## 2013-12-17 ENCOUNTER — Encounter: Payer: Self-pay | Admitting: Cardiology

## 2013-12-17 ENCOUNTER — Ambulatory Visit (INDEPENDENT_AMBULATORY_CARE_PROVIDER_SITE_OTHER): Payer: Medicare Other | Admitting: Cardiology

## 2013-12-17 VITALS — BP 160/62 | HR 62 | Ht 75.0 in | Wt 190.1 lb

## 2013-12-17 DIAGNOSIS — IMO0001 Reserved for inherently not codable concepts without codable children: Secondary | ICD-10-CM

## 2013-12-17 DIAGNOSIS — E1165 Type 2 diabetes mellitus with hyperglycemia: Secondary | ICD-10-CM

## 2013-12-17 DIAGNOSIS — I119 Hypertensive heart disease without heart failure: Secondary | ICD-10-CM

## 2013-12-17 DIAGNOSIS — I359 Nonrheumatic aortic valve disorder, unspecified: Secondary | ICD-10-CM

## 2013-12-17 DIAGNOSIS — M069 Rheumatoid arthritis, unspecified: Secondary | ICD-10-CM

## 2013-12-17 DIAGNOSIS — E785 Hyperlipidemia, unspecified: Secondary | ICD-10-CM

## 2013-12-17 MED ORDER — POTASSIUM CHLORIDE CRYS ER 20 MEQ PO TBCR: 20.0000 meq | EXTENDED_RELEASE_TABLET | Freq: Every day | ORAL | Status: DC

## 2013-12-17 MED ORDER — HYDROCHLOROTHIAZIDE 12.5 MG PO CAPS: 12.5000 mg | ORAL_CAPSULE | Freq: Every day | ORAL | Status: DC

## 2013-12-17 NOTE — Progress Notes (Signed)
[name] Date of Birth:  June 12, 1936 [facility] [address] [place], [place]  [address] [phone]        Fax   [phone]   History of Present Illness: This pleasant 78 year old gentleman is seen for a four-month followup office visit. He was seen in the emergency room on 08/02/13 with uncontrolled hypertension and acute anxiety. He had stayed up all night the previous night checking his blood pressure. He has had worsening problems with anxiety and frustration dealing with multiple orthopedic problems including severe knee pain. He is scheduled for knee surgery on his left knee after January 1 He has a past history of essential hypertension, symptomatic PVCs, a past history of polymyalgia rheumatica, and a present history of severe rheumatoid arthritis. He is followed closely by his rheumatologist Dr. Dareen Piano. The patient does not have any history of ischemic heart disease. He has a known systolic heart murmur and his last echocardiogram 06/13/12 showed normal left ventricular systolic function and showed aortic valve sclerosis without stenosis. Dr. Shon Baton is his back surgeon and Dr. Victorino Dike is his right foot surgeon and Dr. Jillyn Hidden has operated on his left knee. Patient states that his blood pressure has remained high.  He has not been experiencing any chest pain.   Current Outpatient Prescriptions  Medication Sig Dispense Refill  . amLODipine (NORVASC) 10 MG tablet Take 1 tablet (10 mg total) by mouth every morning.  90 tablet  3  . B Complex-C (B-COMPLEX WITH VITAMIN C) tablet Take 1 tablet by mouth 2 (two) times daily.      . Calcium Citrate-Vitamin D (CITRACAL + D PO) Take 1 tablet by mouth 2 (two) times daily.      . cholecalciferol (VITAMIN D) 1000 UNITS tablet Take 2,000 Units by mouth 2 (two) times daily.      . DULoxetine (CYMBALTA) 60 MG capsule Take 60 mg by mouth daily.       . fentaNYL (DURAGESIC - DOSED MCG/HR) 25 MCG/HR patch Place 25 mcg onto  the skin every 3 (three) days.      . Golimumab (SIMPONI ARIA IV) Inject into the vein.      Marland Kitchen leflunomide (ARAVA) 20 MG tablet Take 20 mg by mouth every morning.       Marland Kitchen lisinopril (PRINIVIL,ZESTRIL) 20 MG tablet Take 60 mg by mouth daily.       . metFORMIN (GLUCOPHAGE) 500 MG tablet Take 500-1,000 mg by mouth 2 (two) times daily with a meal. Take 2 tablets in AM and 1 tablet in PM      . methocarbamol (ROBAXIN) 500 MG tablet Take 1 tablet (500 mg total) by mouth 3 (three) times daily.  90 tablet  0  . Methylsulfonylmethane (MSM) 1000 MG CAPS Take 1,000 mg by mouth daily.      . metoprolol succinate (TOPROL-XL) 25 MG 24 hr tablet Take 12.5 mg by mouth daily.      . Multiple Vitamin (MULTIVITAMIN WITH MINERALS) TABS Take 1 tablet by mouth 2 (two) times daily.      Marland Kitchen oxyCODONE-acetaminophen (PERCOCET) 10-325 MG per tablet TAKE ONE TABLET BY MOUTH EVERY 6 HRS AS NEEDED FOR MODERATE PAIN.      Marland Kitchen predniSONE (DELTASONE) 5 MG tablet Take 5 mg by mouth every morning.       . tamsulosin (FLOMAX) 0.4 MG CAPS capsule Take 0.8 mg by mouth daily.       . temazepam (RESTORIL) 30 MG capsule TAKE ONE  CAPSULE BY MOUTH AT BEDTIME AS NEEDED FOR SLEEP  30 capsule  5  . hydrochlorothiazide (MICROZIDE) 12.5 MG capsule Take 1 capsule (12.5 mg total) by mouth daily.  90 capsule  3  . potassium chloride SA (K-DUR,KLOR-CON) 20 MEQ tablet Take 1 tablet (20 mEq total) by mouth daily.  90 tablet  3   No current facility-administered medications for this visit.    Allergies  Allergen Reactions  . Sulfa Drugs Cross Reactors Other (See Comments)    Unknown (Pt stated he may or may not be allergic to this medication)     Patient Active Problem List   Diagnosis Date Noted  . Aortic valve disease 12/17/2013  . GAD (generalized anxiety disorder) 08/06/2013  . Hypokalemia 08/06/2013  . Rheumatoid arthritis 07/10/2011  . Polymyalgia rheumatica 01/10/2011  . Benign hypertensive heart disease without heart failure  01/10/2011  . Dyslipidemia 01/10/2011  . Type II or unspecified type diabetes mellitus without mention of complication, uncontrolled 01/10/2011    History  Smoking status  . Never Smoker   Smokeless tobacco  . Never Used    History  Alcohol Use No    Family History  Problem Relation Age of Onset  . Heart disease Father     Review of Systems: Constitutional: no fever chills diaphoresis or fatigue or change in weight.  Head and neck: no hearing loss, no epistaxis, no photophobia or visual disturbance. Respiratory: No cough, shortness of breath or wheezing. Cardiovascular: No chest pain peripheral edema, palpitations. Gastrointestinal: No abdominal distention, no abdominal pain, no change in bowel habits hematochezia or melena. Genitourinary: No dysuria, no frequency, no urgency, no nocturia. Musculoskeletal:No arthralgias, no back pain, no gait disturbance or myalgias. Neurological: No dizziness, no headaches, no numbness, no seizures, no syncope, no weakness, no tremors. Hematologic: No lymphadenopathy, no easy bruising. Psychiatric: No confusion, no hallucinations, no sleep disturbance.    Physical Exam: Filed Vitals:   12/17/13 1636  BP: 160/62  Pulse: 62   the general appearance reveals a well-developed well-nourished gentleman in no distress.The head and neck exam reveals pupils equal and reactive.  Extraocular movements are full.  There is no scleral icterus.  The mouth and pharynx are normal.  The neck is supple.  The carotids reveal no bruits.  The jugular venous pressure is normal.  The  thyroid is not enlarged.  There is no lymphadenopathy.  The chest is clear to percussion and auscultation.  There are no rales or rhonchi.  Expansion of the chest is symmetrical.  The precordium is quiet.  The first heart sound is normal.  The second heart sound is physiologically split.  There is a grade 3/6 harsh systolic ejection murmur at the base.  No gallop or rub.  There is no  abnormal lift or heave.  The abdomen is soft and nontender.  The bowel sounds are normal.  The liver and spleen are not enlarged.  There are no abdominal masses.  There are no abdominal bruits.  Extremities reveal good pedal pulses.  There is no phlebitis or edema.  There is no cyanosis or clubbing.  Strength is normal and symmetrical in all extremities.  There is no lateralizing weakness.  There are no sensory deficits.  The skin is warm and dry.  There is no rash.     Assessment / Plan: 1. aortic valve disease 2. uncontrolled hypertension 3.  Generalized anxiety disorder 4. rheumatoid arthritis 5. benign PVCs  Plan: Start hydrochlorothiazide 12.5 mg once a day. Start  K-Dur 20 milliequivalents once a day. Return in 2 weeks for a followup basal metabolic panel Update his 2-D echo

## 2013-12-17 NOTE — Assessment & Plan Note (Signed)
The patient is on a new infusion medication for his rheumatoid arthritis.  He is concerned that the new medication may not be agreeing with him.  He will need to discuss this with his rheumatologist Dr. Dareen Piano.

## 2013-12-17 NOTE — Assessment & Plan Note (Signed)
On exam today his systolic heart murmur is more prominent.  We'll update his echocardiogram

## 2013-12-17 NOTE — Patient Instructions (Signed)
ADD HYDROCHLOROTHIAZIDE 12.5 MG DAILY  ADD KDUR 20 MEQ DAILY  Your physician has requested that you have an echocardiogram. Echocardiography is a painless test that uses sound waves to create images of your heart. It provides your doctor with information about the size and shape of your heart and how well your heart's chambers and valves are working. This procedure takes approximately one hour. There are no restrictions for this procedure.  Your physician recommends that you schedule a follow-up appointment in: 4 months with fasting labs (lp/bmet/hfp/a1c)    Follow up labs (bmet) in 2 weeks

## 2013-12-17 NOTE — Assessment & Plan Note (Signed)
Patient reports that his blood pressure is remaining high despite being on multiple antihypertensive medications. We will add HCTZ 12.5 mg one daily.  His potassium tends to be on the low side of normal and we will also add potassium 20 mEq daily.

## 2014-01-01 ENCOUNTER — Other Ambulatory Visit (INDEPENDENT_AMBULATORY_CARE_PROVIDER_SITE_OTHER): Payer: Medicare Other

## 2014-01-01 ENCOUNTER — Ambulatory Visit (HOSPITAL_COMMUNITY): Payer: Medicare Other | Attending: Cardiovascular Disease | Admitting: Radiology

## 2014-01-01 DIAGNOSIS — I119 Hypertensive heart disease without heart failure: Secondary | ICD-10-CM

## 2014-01-01 DIAGNOSIS — I359 Nonrheumatic aortic valve disorder, unspecified: Secondary | ICD-10-CM

## 2014-01-01 DIAGNOSIS — R011 Cardiac murmur, unspecified: Secondary | ICD-10-CM | POA: Insufficient documentation

## 2014-01-01 LAB — BASIC METABOLIC PANEL
BUN: 22 mg/dL (ref 6–23)
CHLORIDE: 102 meq/L (ref 96–112)
CO2: 28 meq/L (ref 19–32)
Calcium: 9.4 mg/dL (ref 8.4–10.5)
Creatinine, Ser: 1.1 mg/dL (ref 0.4–1.5)
GFR: 72.25 mL/min (ref >60.00)
GLUCOSE: 127 mg/dL — AB (ref 70–99)
POTASSIUM: 4.3 meq/L (ref 3.5–5.1)
Sodium: 139 mEq/L (ref 135–145)

## 2014-01-01 NOTE — Progress Notes (Signed)
Echocardiogram performed.  

## 2014-01-02 ENCOUNTER — Telehealth: Payer: Self-pay | Admitting: *Deleted

## 2014-01-02 MED ORDER — AMLODIPINE BESYLATE 10 MG PO TABS: 10.0000 mg | ORAL_TABLET | Freq: Every morning | ORAL | Status: DC

## 2014-01-02 NOTE — Telephone Encounter (Signed)
Message copied by Burnell Blanks on Fri Jan 02, 2014 12:15 PM ------      Message from: Cassell Clement      Created: Fri Jan 02, 2014 10:35 AM       Please report to patient.  The recent labs are stable. Continue same medication and careful diet. BS 127 is improved. ------

## 2014-01-02 NOTE — Progress Notes (Signed)
Quick Note:  Please report to patient. The recent labs are stable. Continue same medication and careful diet. BS 127 is improved. ______

## 2014-01-02 NOTE — Telephone Encounter (Signed)
Message copied by Burnell Blanks on Fri Jan 02, 2014 12:15 PM ------      Message from: Cassell Clement      Created: Fri Jan 02, 2014 10:37 AM       Please report.  The aortic valve regurgitation is still very mild.  LV systolic function is good.  CSD ------

## 2014-01-02 NOTE — Telephone Encounter (Signed)
Patient states he has a history of carpal tunnel in his right hand with surgery. Patient states he is having numbness/tingling in left hand like with carpal tunnel. Patient denies any chest pains, shortness of breath, or arm pain. Advised to call orthopedic regarding this.   Advised of echo and labs

## 2014-01-02 NOTE — Telephone Encounter (Signed)
Agree with advice given

## 2014-01-29 ENCOUNTER — Other Ambulatory Visit: Payer: Self-pay | Admitting: Pulmonary Disease

## 2014-03-12 ENCOUNTER — Other Ambulatory Visit: Payer: Self-pay | Admitting: Cardiology

## 2014-03-12 DIAGNOSIS — G4700 Insomnia, unspecified: Secondary | ICD-10-CM

## 2014-03-12 NOTE — Telephone Encounter (Signed)
New message           Pt would like to go back to a quantity of 90 pills for his medication penazetam / is this ok

## 2014-03-12 NOTE — Telephone Encounter (Signed)
Called patient back. Needs a refill for Temazepam 30mg  but wanted 90 pills at a time instead of 30. Has no tabs left. Advised that I could only authorize 30 pills today because Dr.Brackbill is not in the office today or tomorrow. Patient was fine with this. Called [facility] and they refilled med for 30tabs. His last refill was from 02/12/2014. Will forward message to 04/15/2014 LPN for follow up for future refills for discussion with Dr.Brackbill next week.  Pharm from [facility] called back and advised that he still had 2 refills of 30 tabs left so they will use one of these for the refill.

## 2014-03-16 MED ORDER — TEMAZEPAM 30 MG PO CAPS: 30.0000 mg | ORAL_CAPSULE | Freq: Every evening | ORAL | Status: DC | PRN

## 2014-03-16 NOTE — Telephone Encounter (Signed)
Called to pharmacy for 90 day supply to put on file, left message on patients voice mail

## 2014-03-24 ENCOUNTER — Other Ambulatory Visit: Payer: Self-pay | Admitting: Otolaryngology

## 2014-03-24 DIAGNOSIS — R1310 Dysphagia, unspecified: Secondary | ICD-10-CM

## 2014-03-25 ENCOUNTER — Ambulatory Visit
Admission: RE | Admit: 2014-03-25 | Discharge: 2014-03-25 | Disposition: A | Discharge status: Home / Self Care | Payer: Medicare Other | Source: Ambulatory Visit | Attending: Otolaryngology | Admitting: Otolaryngology

## 2014-03-25 DIAGNOSIS — R1310 Dysphagia, unspecified: Secondary | ICD-10-CM

## 2014-03-31 ENCOUNTER — Other Ambulatory Visit: Payer: Self-pay | Admitting: Cardiology

## 2014-03-31 DIAGNOSIS — IMO0001 Reserved for inherently not codable concepts without codable children: Secondary | ICD-10-CM

## 2014-03-31 DIAGNOSIS — E1165 Type 2 diabetes mellitus with hyperglycemia: Principal | ICD-10-CM

## 2014-04-09 ENCOUNTER — Other Ambulatory Visit: Payer: Self-pay | Admitting: *Deleted

## 2014-04-09 ENCOUNTER — Other Ambulatory Visit: Payer: Self-pay | Admitting: Cardiology

## 2014-04-09 MED ORDER — LISINOPRIL 20 MG PO TABS: 60.0000 mg | ORAL_TABLET | Freq: Every day | ORAL | Status: DC

## 2014-04-14 ENCOUNTER — Other Ambulatory Visit (HOSPITAL_COMMUNITY): Payer: Self-pay | Admitting: Orthopedic Surgery

## 2014-04-14 DIAGNOSIS — Z139 Encounter for screening, unspecified: Secondary | ICD-10-CM

## 2014-04-16 ENCOUNTER — Ambulatory Visit (HOSPITAL_COMMUNITY)
Admission: RE | Admit: 2014-04-16 | Discharge: 2014-04-16 | Disposition: A | Discharge status: Home / Self Care | Payer: Medicare Other | Source: Ambulatory Visit | Attending: Orthopedic Surgery | Admitting: Orthopedic Surgery

## 2014-04-16 DIAGNOSIS — Z139 Encounter for screening, unspecified: Secondary | ICD-10-CM

## 2014-04-21 ENCOUNTER — Other Ambulatory Visit (INDEPENDENT_AMBULATORY_CARE_PROVIDER_SITE_OTHER): Payer: Medicare Other

## 2014-04-21 DIAGNOSIS — IMO0001 Reserved for inherently not codable concepts without codable children: Secondary | ICD-10-CM

## 2014-04-21 DIAGNOSIS — E1165 Type 2 diabetes mellitus with hyperglycemia: Secondary | ICD-10-CM

## 2014-04-21 DIAGNOSIS — E785 Hyperlipidemia, unspecified: Secondary | ICD-10-CM

## 2014-04-22 LAB — LIPID PANEL
Cholesterol: 169 mg/dL (ref 0–200)
HDL: 41.6 mg/dL
LDL Cholesterol: 96 mg/dL (ref 0–99)
NonHDL: 127.4
Total CHOL/HDL Ratio: 4
Triglycerides: 156 mg/dL — ABNORMAL HIGH (ref 0.0–149.0)
VLDL: 31.2 mg/dL (ref 0.0–40.0)

## 2014-04-22 LAB — HEPATIC FUNCTION PANEL
ALT: 29 U/L (ref 0–53)
AST: 38 U/L — ABNORMAL HIGH (ref 0–37)
Albumin: 3.7 g/dL (ref 3.5–5.2)
Alkaline Phosphatase: 54 U/L (ref 39–117)
BILIRUBIN TOTAL: 0.7 mg/dL (ref 0.2–1.2)
Bilirubin, Direct: 0.1 mg/dL (ref 0.0–0.3)
Total Protein: 6.8 g/dL (ref 6.0–8.3)

## 2014-04-22 LAB — BASIC METABOLIC PANEL WITH GFR
BUN: 27 mg/dL — ABNORMAL HIGH (ref 6–23)
CO2: 30 meq/L (ref 19–32)
Calcium: 8.9 mg/dL (ref 8.4–10.5)
Chloride: 103 meq/L (ref 96–112)
Creatinine, Ser: 1 mg/dL (ref 0.4–1.5)
GFR: 78.11 mL/min
Glucose, Bld: 127 mg/dL — ABNORMAL HIGH (ref 70–99)
Potassium: 4.3 meq/L (ref 3.5–5.1)
Sodium: 142 meq/L (ref 135–145)

## 2014-04-22 LAB — HEMOGLOBIN A1C: Hgb A1c MFr Bld: 7 % — ABNORMAL HIGH (ref 4.6–6.5)

## 2014-04-22 NOTE — Progress Notes (Signed)
Quick Note:  Please make copy of labs for patient visit. ______ 

## 2014-04-23 ENCOUNTER — Encounter: Payer: Self-pay | Admitting: Cardiology

## 2014-04-23 ENCOUNTER — Ambulatory Visit (INDEPENDENT_AMBULATORY_CARE_PROVIDER_SITE_OTHER): Payer: Medicare Other | Admitting: Cardiology

## 2014-04-23 VITALS — BP 130/76 | HR 55 | Ht 75.0 in | Wt 180.0 lb

## 2014-04-23 DIAGNOSIS — I359 Nonrheumatic aortic valve disorder, unspecified: Secondary | ICD-10-CM

## 2014-04-23 DIAGNOSIS — IMO0001 Reserved for inherently not codable concepts without codable children: Secondary | ICD-10-CM

## 2014-04-23 DIAGNOSIS — I119 Hypertensive heart disease without heart failure: Secondary | ICD-10-CM

## 2014-04-23 DIAGNOSIS — E1165 Type 2 diabetes mellitus with hyperglycemia: Secondary | ICD-10-CM

## 2014-04-23 DIAGNOSIS — E785 Hyperlipidemia, unspecified: Secondary | ICD-10-CM

## 2014-04-23 NOTE — Patient Instructions (Signed)
Your physician wants you to follow-up in: 4 months with fasting labs (lp/bmet/hfp) You will receive a reminder letter in the mail two months in advance. If you don't receive a letter, please call our office to schedule the follow-up appointment.  Decrease Baby ASA or 81 mg to every other day.

## 2014-04-23 NOTE — Assessment & Plan Note (Signed)
The patient is not having any symptoms of progressive aortic valve disease.  Recent echo in 2013 showed aortic valve sclerosis without stenosis.  Intensity of his heart murmur is unchanged

## 2014-04-23 NOTE — Assessment & Plan Note (Signed)
The patient has not been checking his blood sugars at home.  He has been too preoccupied with his chronic pain situation. He denies hypoglycemic symptoms.

## 2014-04-23 NOTE — Assessment & Plan Note (Signed)
The patient is not having any symptoms of congestive heart failure.  No dizziness or syncope.  No palpitations.

## 2014-04-23 NOTE — Progress Notes (Signed)
[name] Date of Birth:  06/26/1936 [facility] [address] [place], [place]  [address] [phone]        Fax   [phone]   History of Present Illness: This pleasant 78 year old gentleman is seen for a four-month followup office visit. He was seen in the emergency room on 08/02/13 with uncontrolled hypertension and acute anxiety. He had stayed up all night the previous night checking his blood pressure. He has had worsening problems with anxiety and frustration dealing with multiple orthopedic problems including severe knee pain.  He has a past history of essential hypertension, symptomatic PVCs, a past history of polymyalgia rheumatica, and a present history of severe rheumatoid arthritis. He is followed closely by his rheumatologist Dr. Dareen Piano. The patient does not have any history of ischemic heart disease. He has a known systolic heart murmur and his last echocardiogram 06/13/12 showed normal left ventricular systolic function and showed aortic valve sclerosis without stenosis. Dr. Shon Baton is his back surgeon and Dr. Victorino Dike is his right foot surgeon and Dr. Jillyn Hidden has operated on his left knee.  He is having problems now with his right shoulder and is scheduled by Dr. Shon Baton for an MRI of the shoulder and cervical spine tomorrow. He has lost 10 pounds since last visit and 20 pounds since the first of the year.  He feels that his muscle mass is diminishing.  He is on long-term steroids he has had easy bruising of his skin.  He is on a baby aspirin daily and we will reduce that to just every other day to lessen his bruising.  Current Outpatient Prescriptions  Medication Sig Dispense Refill  . amLODipine (NORVASC) 10 MG tablet Take 1 tablet (10 mg total) by mouth every morning.  90 tablet  3  . aspirin 81 MG tablet Take 81 mg by mouth every other day.      . B Complex-C (B-COMPLEX WITH VITAMIN C) tablet Take 1 tablet by mouth 2 (two) times daily.      . Calcium  Citrate-Vitamin D (CITRACAL + D PO) Take 1 tablet by mouth 2 (two) times daily.      . cholecalciferol (VITAMIN D) 1000 UNITS tablet Take 2,000 Units by mouth 2 (two) times daily.      . DULoxetine (CYMBALTA) 60 MG capsule Take 60 mg by mouth daily.       . fentaNYL (DURAGESIC - DOSED MCG/HR) 25 MCG/HR patch Place 25 mcg onto the skin every other day.       . Golimumab (SIMPONI ARIA IV) Inject into the vein.      . hydrochlorothiazide (MICROZIDE) 12.5 MG capsule Take 1 capsule (12.5 mg total) by mouth daily.  90 capsule  3  . HYDROcodone-acetaminophen (NORCO) 10-325 MG per tablet       . leflunomide (ARAVA) 20 MG tablet Take 20 mg by mouth every morning.       Marland Kitchen lisinopril (PRINIVIL,ZESTRIL) 20 MG tablet Take 3 tablets (60 mg total) by mouth daily.  90 tablet  0  . metFORMIN (GLUCOPHAGE) 500 MG tablet TAKE 2 TABLETS (1,000 MG TOTAL) BY MOUTH DAILY WITH BREAKFAST.  60 tablet  0  . methocarbamol (ROBAXIN) 500 MG tablet Take 1 tablet (500 mg total) by mouth 3 (three) times daily.  90 tablet  0  . Methylsulfonylmethane (MSM) 1000 MG CAPS Take 1,000 mg by mouth daily.      . metoprolol succinate (TOPROL-XL) 25 MG 24 hr tablet Take  12.5 mg by mouth daily.      . Multiple Vitamin (MULTIVITAMIN WITH MINERALS) TABS Take 1 tablet by mouth 2 (two) times daily.      Marland Kitchen oxyCODONE-acetaminophen (PERCOCET) 10-325 MG per tablet TAKE ONE TABLET BY MOUTH EVERY 6 HRS AS NEEDED FOR MODERATE PAIN.      Marland Kitchen potassium chloride SA (K-DUR,KLOR-CON) 20 MEQ tablet Take 1 tablet (20 mEq total) by mouth daily.  90 tablet  3  . predniSONE (DELTASONE) 5 MG tablet Take 5 mg by mouth every morning.       . tamsulosin (FLOMAX) 0.4 MG CAPS capsule Take 0.8 mg by mouth daily.       . temazepam (RESTORIL) 30 MG capsule Take 1 capsule (30 mg total) by mouth at bedtime as needed for sleep.  90 capsule  1   No current facility-administered medications for this visit.    Allergies  Allergen Reactions  . Sulfa Drugs Cross Reactors  Other (See Comments)    Unknown (Pt stated he may or may not be allergic to this medication)     Patient Active Problem List   Diagnosis Date Noted  . Aortic valve disease 12/17/2013  . GAD (generalized anxiety disorder) 08/06/2013  . Hypokalemia 08/06/2013  . Rheumatoid arthritis 07/10/2011  . Polymyalgia rheumatica 01/10/2011  . Benign hypertensive heart disease without heart failure 01/10/2011  . Dyslipidemia 01/10/2011  . Type II or unspecified type diabetes mellitus without mention of complication, uncontrolled 01/10/2011    History  Smoking status  . Never Smoker   Smokeless tobacco  . Never Used    History  Alcohol Use No    Family History  Problem Relation Age of Onset  . Heart disease Father     Review of Systems: Constitutional: no fever chills diaphoresis or fatigue or change in weight.  Head and neck: no hearing loss, no epistaxis, no photophobia or visual disturbance. Respiratory: No cough, shortness of breath or wheezing. Cardiovascular: No chest pain peripheral edema, palpitations. Gastrointestinal: No abdominal distention, no abdominal pain, no change in bowel habits hematochezia or melena. Genitourinary: No dysuria, no frequency, no urgency, no nocturia. Musculoskeletal:No arthralgias, no back pain, no gait disturbance or myalgias. Neurological: No dizziness, no headaches, no numbness, no seizures, no syncope, no weakness, no tremors. Hematologic: No lymphadenopathy, no easy bruising. Psychiatric: No confusion, no hallucinations, no sleep disturbance.    Physical Exam: Filed Vitals:   04/23/14 1433  BP: 130/76  Pulse: 55   the general appearance reveals a well-developed well-nourished gentleman in no distress.The head and neck exam reveals pupils equal and reactive.  Extraocular movements are full.  There is no scleral icterus.  The mouth and pharynx are normal.  The neck is supple.  The carotids reveal no bruits.  The jugular venous pressure is  normal.  The  thyroid is not enlarged.  There is no lymphadenopathy.  The chest is clear to percussion and auscultation.  There are no rales or rhonchi.  Expansion of the chest is symmetrical.  The precordium is quiet.  The first heart sound is normal.  The second heart sound is physiologically split.  There is a grade 2/6 harsh systolic ejection murmur at the base.  No gallop or rub.  There is no abnormal lift or heave.  The abdomen is soft and nontender.  The bowel sounds are normal.  The liver and spleen are not enlarged.  There are no abdominal masses.  There are no abdominal bruits.  Extremities reveal good  pedal pulses.  There is no phlebitis or edema.  There is no cyanosis or clubbing.  Strength is normal and symmetrical in all extremities.  There is no lateralizing weakness.  There are no sensory deficits.  The skin is warm and dry.  There is no rash.     Assessment / Plan: 1. aortic valve disease with aortic valve sclerosis 2. essential hypertension without heart failure 3.  Generalized anxiety disorder 4. rheumatoid arthritis 5. benign PVCs resolved  Plan: Continue current medication.  Reduce aspirin to just every other day to cut down on easy bruising.  Dr. Dareen Piano, his rheumatologist, manages his chronic pain situation. We gave the patient a parking permit today. He may require shoulder surgery.  He is getting an MRI tomorrow. Recheck here in 4 months for office visit and lab work.

## 2014-04-30 ENCOUNTER — Other Ambulatory Visit: Payer: Self-pay | Admitting: Cardiology

## 2014-05-14 ENCOUNTER — Other Ambulatory Visit: Payer: Self-pay | Admitting: Cardiology

## 2014-07-26 ENCOUNTER — Other Ambulatory Visit: Payer: Self-pay | Admitting: Cardiology

## 2014-07-27 ENCOUNTER — Other Ambulatory Visit: Payer: Self-pay

## 2014-07-27 MED ORDER — METOPROLOL SUCCINATE ER 25 MG PO TB24: 12.5000 mg | ORAL_TABLET | Freq: Every day | ORAL | Status: DC

## 2014-09-15 ENCOUNTER — Other Ambulatory Visit: Payer: Self-pay | Admitting: *Deleted

## 2014-09-15 ENCOUNTER — Ambulatory Visit: Payer: Medicare Other | Admitting: Cardiology

## 2014-09-15 ENCOUNTER — Other Ambulatory Visit (INDEPENDENT_AMBULATORY_CARE_PROVIDER_SITE_OTHER): Payer: Medicare Other | Admitting: *Deleted

## 2014-09-15 DIAGNOSIS — E785 Hyperlipidemia, unspecified: Secondary | ICD-10-CM

## 2014-09-15 DIAGNOSIS — IMO0002 Reserved for concepts with insufficient information to code with codable children: Secondary | ICD-10-CM

## 2014-09-15 DIAGNOSIS — E1165 Type 2 diabetes mellitus with hyperglycemia: Secondary | ICD-10-CM

## 2014-09-15 LAB — LIPID PANEL
CHOL/HDL RATIO: 4
Cholesterol: 182 mg/dL (ref 0–200)
HDL: 43 mg/dL (ref >39.00)
NONHDL: 139
Triglycerides: 247 mg/dL — ABNORMAL HIGH (ref 0.0–149.0)
VLDL: 49.4 mg/dL — ABNORMAL HIGH (ref 0.0–40.0)

## 2014-09-15 LAB — BASIC METABOLIC PANEL
BUN: 19 mg/dL (ref 6–23)
CHLORIDE: 103 meq/L (ref 96–112)
CO2: 34 mEq/L — ABNORMAL HIGH (ref 19–32)
Calcium: 9.5 mg/dL (ref 8.4–10.5)
Creatinine, Ser: 1.02 mg/dL (ref 0.40–1.50)
GFR: 75.39 mL/min (ref >60.00)
GLUCOSE: 119 mg/dL — AB (ref 70–99)
POTASSIUM: 3.9 meq/L (ref 3.5–5.1)
Sodium: 141 mEq/L (ref 135–145)

## 2014-09-15 LAB — HEPATIC FUNCTION PANEL
ALK PHOS: 50 U/L (ref 39–117)
ALT: 14 U/L (ref 0–53)
AST: 18 U/L (ref 0–37)
Albumin: 4 g/dL (ref 3.5–5.2)
BILIRUBIN TOTAL: 0.7 mg/dL (ref 0.2–1.2)
Bilirubin, Direct: 0.1 mg/dL (ref 0.0–0.3)
Total Protein: 6.6 g/dL (ref 6.0–8.3)

## 2014-09-15 LAB — HEMOGLOBIN A1C: HEMOGLOBIN A1C: 7.5 % — AB (ref 4.6–6.5)

## 2014-09-15 LAB — LDL CHOLESTEROL, DIRECT: Direct LDL: 89 mg/dL

## 2014-09-15 NOTE — Progress Notes (Signed)
Quick Note:  Please make copy of labs for patient visit. ______ 

## 2014-09-18 ENCOUNTER — Encounter: Payer: Self-pay | Admitting: Cardiology

## 2014-09-18 ENCOUNTER — Ambulatory Visit (INDEPENDENT_AMBULATORY_CARE_PROVIDER_SITE_OTHER): Payer: Medicare Other | Admitting: Cardiology

## 2014-09-18 VITALS — BP 132/64 | HR 56 | Ht 75.0 in | Wt 191.0 lb

## 2014-09-18 DIAGNOSIS — I359 Nonrheumatic aortic valve disorder, unspecified: Secondary | ICD-10-CM

## 2014-09-18 DIAGNOSIS — E785 Hyperlipidemia, unspecified: Secondary | ICD-10-CM

## 2014-09-18 DIAGNOSIS — E0865 Diabetes mellitus due to underlying condition with hyperglycemia: Secondary | ICD-10-CM

## 2014-09-18 DIAGNOSIS — I119 Hypertensive heart disease without heart failure: Secondary | ICD-10-CM

## 2014-09-18 NOTE — Progress Notes (Signed)
Cardiology Office Note   Date:  09/18/2014   ID:  [name], DOB 1936/03/11, MRN [id number]  PCP:  Joycelyn Rua, MD  Cardiologist:   Cassell Clement, MD   No chief complaint on file.     History of Present Illness: [name] is a 79 y.o. male who presents for a four-month follow-up office visit.  This pleasant 79 year old gentleman is seen for a four-month followup office visit. He was seen in the emergency room on 08/02/13 with uncontrolled hypertension and acute anxiety. He had stayed up all night the previous night checking his blood pressure. He has had worsening problems with anxiety and frustration dealing with multiple orthopedic problems including severe knee pain. He has a past history of essential hypertension, symptomatic PVCs, a past history of polymyalgia rheumatica, and a present history of severe rheumatoid arthritis. He is followed closely by his rheumatologist Dr. Dareen Piano. The patient does not have any history of ischemic heart disease. He has a known systolic heart murmur and his last echocardiogram 06/13/12 showed normal left ventricular systolic function and showed aortic valve sclerosis without stenosis.  Since we last saw the patient he underwent successful cervical fusion surgery at [facility] by Dr. Edwyna Shell.  The long surgery included fusion of C4-5-6 and 7.  So far the surgical results appeared to be good.  The patient still has residual problems with his right shoulder and with 3 current right carpal tunnel syndrome which will need surgical attention going forward. From the cardiac standpoint the patient has been stable.  He has not been expressing any chest pain or shortness of breath.  His last echocardiogram on 01/01/14 showed mild aortic insufficiency and his echo cardiogram ejection fraction was 60-65%. The patient has adult onset diabetes mellitus.  He has not been having any hypoglycemic episodes.   Past Medical History  Diagnosis Date  . Fatigue     . Aortic valve disease     SCLEROSIS WITHOUT STENOSIS  . Hyperlipidemia   . PVC's (premature ventricular contractions)   . Sciatica   . Anemia   . Coronary artery disease CARDOLOGIST- DR Don Tiu    cardiac catheterization 2005 / EF 50%  . Diabetes mellitus, type 2   . DDD (degenerative disc disease)   . RA (rheumatoid arthritis) RHEUMATOLOGIST-  DR Dareen Piano    SEVERE  . Chronic low back pain     CHRONIC NARCOTIC USE  . Systolic murmur   . Anxiety   . Hypertension     dr Patty Sermons  . BPH (benign prostatic hyperplasia)     Past Surgical History  Procedure Laterality Date  . Retinal detachment surgery Left 2001  . Decompressive lumbar laminectomy level 3  07/17/2012    Procedure: DECOMPRESSIVE LUMBAR LAMINECTOMY LEVEL 3;  Surgeon: Drucilla Schmidt, MD;  Location: [facility];  Service: Orthopedics;  Laterality: N/A;  L3-4, L4-5 AND L5-S1  . Right hydrocelectomy/ spermatocelectomy  09-11-2001  . Carpal tunnel release Right 09-24-2006  . Open repair left quadriceps tendon  11-17-2007  . Lumbar spine surgery  10-07-2008    DECOMPRESSION   L2  - S1  . Left index / long fingers i & d , repair tendon, and pinning  09-18-2010  . Ankle arthrodesis Right 01-11-2011  . Cardiac catheterization  2005    EF 50% /  showed minimal coronary atherosclerosisi   . Hammer toe surgery      RIGHT SECOND TOE  . Transthoracic echocardiogram  06-13-2012  [facility]  MILD LVH/ LVSF NORMAL/ EF 55-60%/ GRADE I DIASTOLIC DYSFUNCTION/  MILD AV AND MV REGURG/ AORTIC VALVE SCLEROSIS WITHOUT STENOSIS  . Hardware removal Right 10/17/2012    Procedure: HARDWARE REMOVAL;  Surgeon: Drucilla Schmidt, MD;  Location: [facility];  Service: Orthopedics;  Laterality: Right;  REMOVE SCREW RIGHT ANKLE  . Anterior cervical decomp/discectomy fusion N/A 04/16/2013    Procedure: L4 - L5 REVISION DECOMPRESSION AND FUSION 1 LEVEL;  Surgeon: Venita Lick, MD;  Location: [facility];  Service: Orthopedics;  Laterality:  N/A;     Current Outpatient Prescriptions  Medication Sig Dispense Refill  . amLODipine (NORVASC) 10 MG tablet Take 1 tablet (10 mg total) by mouth every morning. 90 tablet 3  . B Complex-C (B-COMPLEX WITH VITAMIN C) tablet Take 1 tablet by mouth 2 (two) times daily.    . Calcium Citrate-Vitamin D (CITRACAL + D PO) Take 1 tablet by mouth daily.     . cholecalciferol (VITAMIN D) 1000 UNITS tablet Take 4,000 Units by mouth daily.     . DULoxetine (CYMBALTA) 60 MG capsule Take 60 mg by mouth daily.     . fentaNYL (DURAGESIC - DOSED MCG/HR) 25 MCG/HR patch Place 25 mcg onto the skin every other Spears.     . Golimumab (SIMPONI ARIA IV) Inject into the vein.    . hydrochlorothiazide (MICROZIDE) 12.5 MG capsule Take 1 capsule (12.5 mg total) by mouth daily. 90 capsule 3  . HYDROcodone-acetaminophen (NORCO) 10-325 MG per tablet Take 3 tablets by mouth daily.     . Influenza Vac Split High-Dose 0.5 ML SUSY     . leflunomide (ARAVA) 20 MG tablet Take 20 mg by mouth every morning.     Marland Kitchen lisinopril (PRINIVIL,ZESTRIL) 20 MG tablet TAKE 3 TABLETS (60 MG TOTAL) BY MOUTH DAILY. 90 tablet 3  . metFORMIN (GLUCOPHAGE) 500 MG tablet TAKE 2 TABLETS (1,000 MG TOTAL) BY MOUTH DAILY WITH BREAKFAST. 60 tablet 11  . methocarbamol (ROBAXIN) 500 MG tablet Take 1 tablet (500 mg total) by mouth 3 (three) times daily. (Patient taking differently: Take 500 mg by mouth daily. Take two tablets daily in the morning.) 90 tablet 0  . Methylsulfonylmethane (MSM) 1000 MG CAPS Take 1,000 mg by mouth daily.    . metoprolol succinate (TOPROL-XL) 25 MG 24 hr tablet Take 0.5 tablets (12.5 mg total) by mouth daily. 45 tablet 3  . Multiple Vitamin (MULTIVITAMIN WITH MINERALS) TABS Take 1 tablet by mouth daily.     . potassium chloride SA (K-DUR,KLOR-CON) 20 MEQ tablet Take 1 tablet (20 mEq total) by mouth daily. 90 tablet 3  . predniSONE (DELTASONE) 5 MG tablet Take 5 mg by mouth every morning.     . tamsulosin (FLOMAX) 0.4 MG CAPS  capsule Take 0.8 mg by mouth daily.     . temazepam (RESTORIL) 30 MG capsule Take 1 capsule (30 mg total) by mouth at bedtime as needed for sleep. 90 capsule 1  . triamcinolone (NASACORT) 55 MCG/ACT AERO nasal inhaler Place 2 sprays into the nose.     No current facility-administered medications for this visit.    Allergies:   Sulfa drugs cross reactors    Social History:  The patient  reports that he has never smoked. He has never used smokeless tobacco. He reports that he does not drink alcohol or use illicit drugs.   Family History:  The patient's family history includes Heart disease in his father.    ROS:  Please see the history  of present illness.   Otherwise, review of systems are positive for none.   All other systems are reviewed and negative.    PHYSICAL EXAM: VS:  BP 132/64 mmHg  Pulse 56  Ht 6\' 3"  (1.905 m)  Wt 191 lb (86.637 kg)  BMI 23.87 kg/m2 , BMI Body mass index is 23.87 kg/(m^2). GEN: Well nourished, well developed, in no acute distress HEENT: normal Neck: no JVD, carotid bruits, or masses Cardiac: RRR; no murmurs, rubs, or gallops,no edema  Respiratory:  clear to auscultation bilaterally, normal work of breathing GI: soft, nontender, nondistended, + BS MS: no deformity or atrophy Skin: warm and dry, no rash Neuro:  Strength and sensation are intact Psych: euthymic mood, full affect   EKG:  EKG is ordered today. The ekg ordered today demonstrates sinus bradycardia and no ischemic changes.   Recent Labs: 09/15/2014: ALT 14; BUN 19; Creatinine 1.02; Potassium 3.9; Sodium 141    Lipid Panel    Component Value Date/Time   CHOL 182 09/15/2014 1418   TRIG 247.0* 09/15/2014 1418   HDL 43.00 09/15/2014 1418   CHOLHDL 4 09/15/2014 1418   VLDL 49.4* 09/15/2014 1418   LDLCALC 96 04/21/2014 1602   LDLDIRECT 89.0 09/15/2014 1418      Wt Readings from Last 3 Encounters:  09/18/14 191 lb (86.637 kg)  04/23/14 180 lb (81.647 kg)  12/17/13 190 lb 1.9 oz  (86.238 kg)      Other studies Reviewed:    ASSESSMENT AND PLAN:  1. aortic valve disease with aortic valve sclerosis and mild aortic insufficiency. 2. essential hypertension without heart failure 3. Generalized anxiety disorder 4. rheumatoid arthritis on chronic immunosuppressant therapy 5. benign PVCs resolved 6.diabetes mellitus type II 7.  Status post fusion of C4-5-6 and 7   Current medicines are reviewed at length with the patient today.  The patient does not have concerns regarding medicines.  The following changes have been made:  no change  Labs/ tests ordered today include:   Orders Placed This Encounter  Procedures  . Lipid panel  . Hepatic function panel  . Basic metabolic panel  . Hemoglobin A1c     Disposition:   FU with Dr. Patty Sermons in 4 months follow-up office visit lipid panel hepatic function panel basal metabolic panel and hemoglobin W8E   Signed, Cassell Clement, MD  09/18/2014 5:07 PM    [facility] [address] [place], [place], [place]  [address] Phone: [phone]; Fax: [phone]

## 2014-09-18 NOTE — Patient Instructions (Signed)
Your physician recommends that you continue on your current medications as directed. Please refer to the Current Medication list given to you today.  Your physician recommends that you schedule a follow-up appointment in: 4 months with fasting labs (lp/bmet/hfp/A1C)

## 2014-09-24 ENCOUNTER — Telehealth: Payer: Self-pay | Admitting: Cardiology

## 2014-09-24 NOTE — Telephone Encounter (Signed)
New message      please refill lisinopril at [facility] [place]

## 2014-09-25 ENCOUNTER — Other Ambulatory Visit: Payer: Self-pay

## 2014-09-25 MED ORDER — LISINOPRIL 20 MG PO TABS: ORAL_TABLET | ORAL | Status: DC

## 2014-10-01 ENCOUNTER — Other Ambulatory Visit: Payer: Self-pay | Admitting: *Deleted

## 2014-10-01 DIAGNOSIS — G4700 Insomnia, unspecified: Secondary | ICD-10-CM

## 2014-10-01 MED ORDER — TEMAZEPAM 30 MG PO CAPS: 30.0000 mg | ORAL_CAPSULE | Freq: Every evening | ORAL | Status: DC | PRN

## 2014-11-19 ENCOUNTER — Other Ambulatory Visit: Payer: Self-pay | Admitting: Cardiology

## 2014-11-19 NOTE — Telephone Encounter (Signed)
Cassell Clement, MD at 09/18/2014 4:03 PM  lisinopril (PRINIVIL,ZESTRIL) 20 MG tabletTAKE 3 TABLETS (60 MG TOTAL) BY MOUTH DAILY  Current medicines are reviewed at length with the patient today. The patient does not have concerns regarding medicines.

## 2014-12-30 ENCOUNTER — Other Ambulatory Visit: Payer: Self-pay | Admitting: Cardiology

## 2015-01-15 ENCOUNTER — Other Ambulatory Visit (INDEPENDENT_AMBULATORY_CARE_PROVIDER_SITE_OTHER): Payer: Medicare Other | Admitting: *Deleted

## 2015-01-15 DIAGNOSIS — E785 Hyperlipidemia, unspecified: Secondary | ICD-10-CM | POA: Diagnosis not present

## 2015-01-15 DIAGNOSIS — E0865 Diabetes mellitus due to underlying condition with hyperglycemia: Secondary | ICD-10-CM | POA: Diagnosis not present

## 2015-01-15 DIAGNOSIS — I119 Hypertensive heart disease without heart failure: Secondary | ICD-10-CM

## 2015-01-15 LAB — LIPID PANEL
Cholesterol: 194 mg/dL (ref 0–200)
HDL: 37.6 mg/dL — ABNORMAL LOW (ref >39.00)
NonHDL: 156.4
Total CHOL/HDL Ratio: 5
Triglycerides: 336 mg/dL — ABNORMAL HIGH (ref 0.0–149.0)
VLDL: 67.2 mg/dL — ABNORMAL HIGH (ref 0.0–40.0)

## 2015-01-15 LAB — BASIC METABOLIC PANEL
BUN: 23 mg/dL (ref 6–23)
CO2: 31 mEq/L (ref 19–32)
CREATININE: 1.08 mg/dL (ref 0.40–1.50)
Calcium: 9.1 mg/dL (ref 8.4–10.5)
Chloride: 104 mEq/L (ref 96–112)
GFR: 70.51 mL/min (ref >60.00)
Glucose, Bld: 129 mg/dL — ABNORMAL HIGH (ref 70–99)
Potassium: 3.8 mEq/L (ref 3.5–5.1)
SODIUM: 140 meq/L (ref 135–145)

## 2015-01-15 LAB — HEPATIC FUNCTION PANEL
ALBUMIN: 3.8 g/dL (ref 3.5–5.2)
ALT: 18 U/L (ref 0–53)
AST: 18 U/L (ref 0–37)
Alkaline Phosphatase: 52 U/L (ref 39–117)
BILIRUBIN DIRECT: 0.1 mg/dL (ref 0.0–0.3)
Total Bilirubin: 0.7 mg/dL (ref 0.2–1.2)
Total Protein: 6.9 g/dL (ref 6.0–8.3)

## 2015-01-15 LAB — HEMOGLOBIN A1C: Hgb A1c MFr Bld: 6.9 % — ABNORMAL HIGH (ref 4.6–6.5)

## 2015-01-15 LAB — LDL CHOLESTEROL, DIRECT: LDL DIRECT: 92 mg/dL

## 2015-01-15 NOTE — Progress Notes (Signed)
Quick Note:  Please make copy of labs for patient visit. ______ 

## 2015-01-15 NOTE — Addendum Note (Signed)
Addended by: Tonita Phoenix on: 01/15/2015 11:23 AM   Modules accepted: Orders

## 2015-01-18 ENCOUNTER — Other Ambulatory Visit: Payer: Medicare Other

## 2015-01-20 ENCOUNTER — Ambulatory Visit: Payer: Medicare Other | Admitting: Cardiology

## 2015-02-08 ENCOUNTER — Other Ambulatory Visit: Payer: Self-pay

## 2015-02-15 ENCOUNTER — Other Ambulatory Visit: Payer: Self-pay | Admitting: Cardiology

## 2015-02-16 ENCOUNTER — Other Ambulatory Visit: Payer: Self-pay

## 2015-02-16 DIAGNOSIS — I119 Hypertensive heart disease without heart failure: Secondary | ICD-10-CM

## 2015-02-16 MED ORDER — POTASSIUM CHLORIDE CRYS ER 20 MEQ PO TBCR
20.0000 meq | EXTENDED_RELEASE_TABLET | Freq: Every day | ORAL | Status: DC
Start: 2015-02-16 — End: 2016-06-23

## 2015-02-23 ENCOUNTER — Ambulatory Visit: Payer: Medicare Other | Admitting: Cardiology

## 2015-02-23 ENCOUNTER — Other Ambulatory Visit: Payer: Medicare Other

## 2015-03-25 ENCOUNTER — Other Ambulatory Visit: Payer: Self-pay | Admitting: Cardiology

## 2015-03-25 DIAGNOSIS — G4700 Insomnia, unspecified: Secondary | ICD-10-CM

## 2015-03-26 NOTE — Telephone Encounter (Signed)
Okay to refill temazepam

## 2015-03-28 ENCOUNTER — Other Ambulatory Visit: Payer: Self-pay | Admitting: Cardiology

## 2015-04-08 ENCOUNTER — Ambulatory Visit (INDEPENDENT_AMBULATORY_CARE_PROVIDER_SITE_OTHER): Payer: Medicare Other | Admitting: Cardiology

## 2015-04-08 ENCOUNTER — Encounter: Payer: Self-pay | Admitting: Cardiology

## 2015-04-08 VITALS — BP 140/52 | HR 64 | Ht 75.0 in | Wt 194.0 lb

## 2015-04-08 DIAGNOSIS — I119 Hypertensive heart disease without heart failure: Secondary | ICD-10-CM | POA: Diagnosis not present

## 2015-04-08 DIAGNOSIS — I358 Other nonrheumatic aortic valve disorders: Secondary | ICD-10-CM | POA: Diagnosis not present

## 2015-04-08 DIAGNOSIS — E785 Hyperlipidemia, unspecified: Secondary | ICD-10-CM | POA: Diagnosis not present

## 2015-04-08 DIAGNOSIS — E0865 Diabetes mellitus due to underlying condition with hyperglycemia: Secondary | ICD-10-CM | POA: Diagnosis not present

## 2015-04-08 NOTE — Patient Instructions (Signed)
Medication Instructions:  Your physician recommends that you continue on your current medications as directed. Please refer to the Current Medication list given to you today.  Labwork: none  Testing/Procedures: none  Follow-Up: Your physician recommends that you schedule a follow-up appointment in: 4 months with fasting labs (lp/bmet/hfp) and ekg      

## 2015-04-08 NOTE — Progress Notes (Signed)
Cardiology Office Note   Date:  04/08/2015   ID:  [name], DOB 10-13-1935, MRN [id number]  PCP:  Joycelyn Rua, MD  Cardiologist: Cassell Clement MD  Chief Complaint  Patient presents with  . Urinary Retention      History of Present Illness: [name] is a 79 y.o. male who presents for a four-month follow-up visit  This pleasant 79 year old gentleman is seen for a four-month followup office visit. He was seen in the emergency room on 08/02/13 with uncontrolled hypertension and acute anxiety. He had stayed up all night the previous night checking his blood pressure. He has had worsening problems with anxiety and frustration dealing with multiple orthopedic problems including severe knee pain. He has a past history of essential hypertension, symptomatic PVCs, a past history of polymyalgia rheumatica, and a present history of severe rheumatoid arthritis. He is followed closely by his rheumatologist Dr. Dierdre Forth. The patient does not have any history of ischemic heart disease. He has a known systolic heart murmur and his last echocardiogram 06/13/12 showed normal left ventricular systolic function and showed aortic valve sclerosis without stenosis.  Since we last saw the patient he underwent successful cervical fusion surgery at [facility] by Dr. Edwyna Shell. The long surgery included fusion of C4-5-6 and 7. So far the surgical results appeared to be good. The patient still has residual problems with his right shoulder and with 3 current right carpal tunnel syndrome which will need surgical attention going forward.  He anticipates that he will be having a total right shoulder replacement in the near future by Dr. supple From the cardiac standpoint the patient has been stable. He has not been expressing any chest pain or shortness of breath. His last echocardiogram on 01/01/14 showed mild aortic insufficiency and his echo cardiogram ejection fraction was 60-65%. The patient has adult  onset diabetes mellitus. He has not been having any hypoglycemic episodes.  Past Medical History  Diagnosis Date  . Fatigue   . Aortic valve disease     SCLEROSIS WITHOUT STENOSIS  . Hyperlipidemia   . PVC's (premature ventricular contractions)   . Sciatica   . Anemia   . Coronary artery disease CARDOLOGIST- DR Shakhia Gramajo    cardiac catheterization 2005 / EF 50%  . Diabetes mellitus, type 2   . DDD (degenerative disc disease)   . RA (rheumatoid arthritis) RHEUMATOLOGIST-  DR Dareen Piano    SEVERE  . Chronic low back pain     CHRONIC NARCOTIC USE  . Systolic murmur   . Anxiety   . Hypertension     dr Patty Sermons  . BPH (benign prostatic hyperplasia)     Past Surgical History  Procedure Laterality Date  . Retinal detachment surgery Left 2001  . Decompressive lumbar laminectomy level 3  07/17/2012    Procedure: DECOMPRESSIVE LUMBAR LAMINECTOMY LEVEL 3;  Surgeon: Drucilla Schmidt, MD;  Location: [facility];  Service: Orthopedics;  Laterality: N/A;  L3-4, L4-5 AND L5-S1  . Right hydrocelectomy/ spermatocelectomy  09-11-2001  . Carpal tunnel release Right 09-24-2006  . Open repair left quadriceps tendon  11-17-2007  . Lumbar spine surgery  10-07-2008    DECOMPRESSION   L2  - S1  . Left index / long fingers i & d , repair tendon, and pinning  09-18-2010  . Ankle arthrodesis Right 01-11-2011  . Cardiac catheterization  2005    EF 50% /  showed minimal coronary atherosclerosisi   . Hammer toe surgery  RIGHT SECOND TOE  . Transthoracic echocardiogram  06-13-2012  DR Lacrisha Bielicki    MILD LVH/ LVSF NORMAL/ EF 55-60%/ GRADE I DIASTOLIC DYSFUNCTION/  MILD AV AND MV REGURG/ AORTIC VALVE SCLEROSIS WITHOUT STENOSIS  . Hardware removal Right 10/17/2012    Procedure: HARDWARE REMOVAL;  Surgeon: Drucilla Schmidt, MD;  Location: [facility];  Service: Orthopedics;  Laterality: Right;  REMOVE SCREW RIGHT ANKLE  . Anterior cervical decomp/discectomy fusion N/A 04/16/2013    Procedure: L4  - L5 REVISION DECOMPRESSION AND FUSION 1 LEVEL;  Surgeon: Venita Lick, MD;  Location: [facility];  Service: Orthopedics;  Laterality: N/A;     Current Outpatient Prescriptions  Medication Sig Dispense Refill  . amLODipine (NORVASC) 10 MG tablet TAKE 1 TABLET (10 MG TOTAL) BY MOUTH EVERY MORNING. 90 tablet 3  . B Complex-C (B-COMPLEX WITH VITAMIN C) tablet Take 1 tablet by mouth 2 (two) times daily.    Marland Kitchen CALCIUM & MAGNESIUM CARBONATES PO Take 1 tablet by mouth daily.     . Calcium Citrate-Vitamin D (CITRACAL + D PO) Take 1 tablet by mouth daily.     . cholecalciferol (VITAMIN D) 1000 UNITS tablet Take 4,000 Units by mouth daily.     . cyanocobalamin (,VITAMIN B-12,) 1000 MCG/ML injection 1CC IM WEEKLY FOR 4 WEEKS, THEN 1CC IM MONTHLY THEREAFTER  6  . DULoxetine (CYMBALTA) 30 MG capsule Take 30 mg by mouth daily.  2  . fentaNYL (DURAGESIC - DOSED MCG/HR) 25 MCG/HR patch Place 25 mcg onto the skin every other day.     . Golimumab (SIMPONI ARIA IV) Inject into the vein.    . hydrochlorothiazide (MICROZIDE) 12.5 MG capsule TAKE 1 CAPSULE (12.5 MG TOTAL) BY MOUTH DAILY. 90 capsule 3  . HYDROcodone-acetaminophen (NORCO) 10-325 MG per tablet Take 3 tablets by mouth daily.     . Influenza Vac Split High-Dose 0.5 ML SUSY     . leflunomide (ARAVA) 20 MG tablet Take 20 mg by mouth every morning.     Marland Kitchen lisinopril (PRINIVIL,ZESTRIL) 20 MG tablet TAKE 3 TABLETS (60 MG TOTAL) BY MOUTH DAILY. 270 tablet 2  . LYRICA 75 MG capsule Take 75 mg by mouth daily.   0  . metFORMIN (GLUCOPHAGE) 500 MG tablet TAKE 2 TABLETS (1,000 MG TOTAL) BY MOUTH DAILY WITH BREAKFAST. 60 tablet 11  . metoprolol succinate (TOPROL-XL) 25 MG 24 hr tablet Take 0.5 tablets (12.5 mg total) by mouth daily. 45 tablet 3  . Multiple Vitamin (MULTIVITAMIN WITH MINERALS) TABS Take 1 tablet by mouth daily.     . potassium chloride SA (K-DUR,KLOR-CON) 20 MEQ tablet Take 1 tablet (20 mEq total) by mouth daily. 90 tablet 3  . predniSONE (DELTASONE) 5  MG tablet Take 5 mg by mouth every morning.     . tamsulosin (FLOMAX) 0.4 MG CAPS capsule Take 0.4 mg by mouth daily.     . temazepam (RESTORIL) 30 MG capsule TAKE ONE CAPSULE BY MOUTH AT BEDTIME AS NEEDED FOR SLEEP 90 capsule 1  . triamcinolone (NASACORT) 55 MCG/ACT AERO nasal inhaler Place 2 sprays into the nose as needed.      No current facility-administered medications for this visit.    Allergies:   Sulfa drugs cross reactors    Social History:  The patient  reports that he has never smoked. He has never used smokeless tobacco. He reports that he does not drink alcohol or use illicit drugs.   Family History:  The patient's family history includes Heart  disease in his father. There is no history of Heart attack or Stroke.    ROS:  Please see the history of present illness.   Otherwise, review of systems are positive for none.   All other systems are reviewed and negative.    PHYSICAL EXAM: VS:  BP 140/52 mmHg  Pulse 64  Ht 6\' 3"  (1.905 m)  Wt 194 lb (87.998 kg)  BMI 24.25 kg/m2  SpO2 97% , BMI Body mass index is 24.25 kg/(m^2). GEN: Well nourished, well developed, in no acute distress HEENT: normal Neck: no JVD, carotid bruits, or masses Cardiac: RRR; grade 2/6 systolic ejection murmur at the base.  Faint murmur of aortic insufficiency. Respiratory:  clear to auscultation bilaterally, normal work of breathing GI: soft, nontender, nondistended, + BS MS: no deformity or atrophy Skin: warm and dry, no rash Neuro:  Strength and sensation are intact Psych: euthymic mood, full affect   EKG:  EKG is not ordered today.    Recent Labs: 01/15/2015: ALT 18; BUN 23; Creatinine, Ser 1.08; Potassium 3.8; Sodium 140    Lipid Panel    Component Value Date/Time   CHOL 194 01/15/2015 1124   TRIG 336.0* 01/15/2015 1124   HDL 37.60* 01/15/2015 1124   CHOLHDL 5 01/15/2015 1124   VLDL 67.2* 01/15/2015 1124   LDLCALC 96 04/21/2014 1602   LDLDIRECT 92.0 01/15/2015 1124      Wt  Readings from Last 3 Encounters:  04/08/15 194 lb (87.998 kg)  09/18/14 191 lb (86.637 kg)  04/23/14 180 lb (81.647 kg)         ASSESSMENT AND PLAN:  1. aortic valve disease with aortic valve sclerosis and mild aortic insufficiency. 2. essential hypertension without heart failure 3. Generalized anxiety disorder 4. rheumatoid arthritis on chronic immunosuppressant therapy 5. benign PVCs resolved 6.diabetes mellitus type II 7. Status post fusion of C4-5-6 and 7   Current medicines are reviewed at length with the patient today.  The patient does not have concerns regarding medicines.  The following changes have been made:  no change  Labs/ tests ordered today include:   Orders Placed This Encounter  Procedures  . Lipid panel  . Hepatic function panel  . Basic metabolic panel   Continue on same medication.  Recheck in 4 months for office visit EKG lipid panel hepatic function panel and basal metabolic panel  Signed, Cassell Clement MD 04/08/2015 5:43 PM    [facility] [address] [place], [place], [place]  [address] Phone: [phone]; Fax: [phone]

## 2015-05-06 ENCOUNTER — Other Ambulatory Visit: Payer: Self-pay | Admitting: Otolaryngology

## 2015-05-06 DIAGNOSIS — R1310 Dysphagia, unspecified: Secondary | ICD-10-CM

## 2015-05-12 ENCOUNTER — Ambulatory Visit
Admission: RE | Admit: 2015-05-12 | Discharge: 2015-05-12 | Disposition: A | Discharge status: Home / Self Care | Payer: Medicare Other | Source: Ambulatory Visit | Attending: Otolaryngology | Admitting: Otolaryngology

## 2015-05-12 DIAGNOSIS — R1310 Dysphagia, unspecified: Secondary | ICD-10-CM

## 2015-05-31 ENCOUNTER — Other Ambulatory Visit: Payer: Self-pay | Admitting: Cardiology

## 2015-05-31 NOTE — Telephone Encounter (Signed)
Okay to refill metformin 

## 2015-05-31 NOTE — Telephone Encounter (Signed)
Ok to refill 

## 2015-06-17 ENCOUNTER — Telehealth: Payer: Self-pay | Admitting: Cardiology

## 2015-06-17 NOTE — Telephone Encounter (Signed)
New message     Request for surgical clearance:  1. What type of surgery is being performed? Turp   2. When is this surgery scheduled? Pending   3. Are there any medications that need to be held prior to surgery and how long? Nothing at this time - cardiac clearance   4. Name of physician performing surgery? Dr. Sherryl Barters  5. What is your office phone and fax number? [phone] / ext [phone]    6. fax [phone]

## 2015-06-17 NOTE — Telephone Encounter (Signed)
The patient is cleared from cardiac standpoint for TURP

## 2015-06-17 NOTE — Telephone Encounter (Signed)
Will forward to  Dr. Brackbill for review 

## 2015-06-18 ENCOUNTER — Other Ambulatory Visit: Payer: Self-pay | Admitting: Urology

## 2015-06-18 NOTE — Telephone Encounter (Signed)
Will fax to Dr Sherryl Barters

## 2015-06-21 ENCOUNTER — Encounter (HOSPITAL_COMMUNITY)
Admission: RE | Admit: 2015-06-21 | Discharge: 2015-06-21 | Disposition: A | Discharge status: Home / Self Care | Payer: Medicare Other | Source: Ambulatory Visit | Attending: Urology | Admitting: Urology

## 2015-06-21 ENCOUNTER — Encounter (HOSPITAL_COMMUNITY): Payer: Self-pay

## 2015-06-21 DIAGNOSIS — Z01818 Encounter for other preprocedural examination: Secondary | ICD-10-CM | POA: Diagnosis present

## 2015-06-21 DIAGNOSIS — N210 Calculus in bladder: Secondary | ICD-10-CM | POA: Insufficient documentation

## 2015-06-21 DIAGNOSIS — N400 Enlarged prostate without lower urinary tract symptoms: Secondary | ICD-10-CM | POA: Insufficient documentation

## 2015-06-21 HISTORY — DX: Personal history of systemic steroid therapy: Z92.241

## 2015-06-21 LAB — BASIC METABOLIC PANEL
Anion gap: 7 (ref 5–15)
BUN: 18 mg/dL (ref 6–20)
CO2: 32 mmol/L (ref 22–32)
CREATININE: 0.92 mg/dL (ref 0.61–1.24)
Calcium: 9.4 mg/dL (ref 8.9–10.3)
Chloride: 105 mmol/L (ref 101–111)
GFR calc Af Amer: >60 mL/min (ref >60)
GFR calc non Af Amer: >60 mL/min (ref >60)
GLUCOSE: 121 mg/dL — AB (ref 65–99)
POTASSIUM: 4.7 mmol/L (ref 3.5–5.1)
SODIUM: 144 mmol/L (ref 135–145)

## 2015-06-21 LAB — CBC
HEMATOCRIT: 35.9 % — AB (ref 39.0–52.0)
Hemoglobin: 11.7 g/dL — ABNORMAL LOW (ref 13.0–17.0)
MCH: 29.8 pg (ref 26.0–34.0)
MCHC: 32.6 g/dL (ref 30.0–36.0)
MCV: 91.6 fL (ref 78.0–100.0)
Platelets: 223 10*3/uL (ref 150–400)
RBC: 3.92 MIL/uL — ABNORMAL LOW (ref 4.22–5.81)
RDW: 13.9 % (ref 11.5–15.5)
WBC: 5.6 10*3/uL (ref 4.0–10.5)

## 2015-06-21 LAB — PROTIME-INR
INR: 0.96 (ref 0.00–1.49)
Prothrombin Time: 13 seconds (ref 11.6–15.2)

## 2015-06-21 LAB — APTT: APTT: 28 s (ref 24–37)

## 2015-06-21 NOTE — Pre-Procedure Instructions (Signed)
06-21-15 EKG 2'16, Echo 5'15 Epic.

## 2015-06-21 NOTE — Patient Instructions (Addendum)
20 ROI JAFARI  06/21/2015   Your procedure is scheduled on:   06-29-2015 Tuesday  Enter through [facility]  Entrance and follow signs to [facility]. Arrive at      0800 AM.  (Limit 1 person with you).  Call this number if you have problems the morning of surgery: [phone]  Or Presurgical Testing [phone].   For Living Will and/or Health Care Power Attorney Forms: please provide copy for your medical record,may bring AM of surgery(Forms should be already notarized -we do not provide this service).(06-21-15 Yes. Will provide  Information day of surgery).  Remember: Follow any bowel prep instructions per MD office.    Do not eat food/ or drink: After Midnight.      Take these medicines the morning of surgery with A SIP OF WATER-   (DO NOT TAKE ANY DIABETIC MEDS AM OF SURGERY) : Amlodipine. Duloxetine. Prevacid. Prednisone. Fentanyl Patch. Lyrica. Metoprololol. Tamsulosin.  Do not wear jewelry, make-up or nail polish.  Do not wear deodorant, lotions, powders, or perfumes.   Do not shave legs and under arms- 48 hours(2 days) prior to first CHG shower.(Shaving face and neck okay.)  Do not bring valuables to the hospital.(Hospital is not responsible for lost valuables).  Contacts, dentures or removable bridgework, body piercing, hair pins may not be worn into surgery.  Leave suitcase in the car. After surgery it may be brought to your room.  For patients admitted to the hospital, checkout time is 11:00 AM the day of discharge.(Restricted visitors-Any Persons displaying flu-like symptoms or illness).    Patients discharged the day of surgery will not be allowed to drive home. Must have responsible person with you x 24 hours once discharged.  Name and phone number of your driver: sonRudell Cobb [phone] cell     Please read over the following fact sheets that you were given:  CHG(Chlorhexidine Gluconate 4% Surgical Soap) use.           [place] - Preparing  for Surgery Before surgery, you can play an important role.  Because skin is not sterile, your skin needs to be as free of germs as possible.  You can reduce the number of germs on your skin by washing with CHG (chlorahexidine gluconate) soap before surgery.  CHG is an antiseptic cleaner which kills germs and bonds with the skin to continue killing germs even after washing. Please DO NOT use if you have an allergy to CHG or antibacterial soaps.  If your skin becomes reddened/irritated stop using the CHG and inform your nurse when you arrive at Short Stay. Do not shave (including legs and underarms) for at least 48 hours prior to the first CHG shower.  You may shave your face/neck. Please follow these instructions carefully:  1.  Shower with CHG Soap the night before surgery and the  morning of Surgery.  2.  If you choose to wash your hair, wash your hair first as usual with your  normal  shampoo.  3.  After you shampoo, rinse your hair and body thoroughly to remove the  shampoo.                           4.  Use CHG as you would any other liquid soap.  You can apply chg directly  to the skin and wash  Gently with a scrungie or clean washcloth.  5.  Apply the CHG Soap to your body ONLY FROM THE NECK DOWN.   Do not use on face/ open                           Wound or open sores. Avoid contact with eyes, ears mouth and genitals (private parts).                       Wash face,  Genitals (private parts) with your normal soap.             6.  Wash thoroughly, paying special attention to the area where your surgery  will be performed.  7.  Thoroughly rinse your body with warm water from the neck down.  8.  DO NOT shower/wash with your normal soap after using and rinsing off  the CHG Soap.                9.  Pat yourself dry with a clean towel.            10.  Wear clean pajamas.            11.  Place clean sheets on your bed the night of your first shower and do not  sleep with  pets. Day of Surgery : Do not apply any lotions/deodorants the morning of surgery.  Please wear clean clothes to the hospital/surgery center.  FAILURE TO FOLLOW THESE INSTRUCTIONS MAY RESULT IN THE CANCELLATION OF YOUR SURGERY PATIENT SIGNATURE_________________________________  NURSE SIGNATURE__________________________________  ________________________________________________________________________

## 2015-06-22 LAB — URINE CULTURE: CULTURE: NO GROWTH

## 2015-06-29 ENCOUNTER — Encounter (HOSPITAL_COMMUNITY)
Admission: RE | Disposition: A | Discharge status: Home / Self Care | Payer: Self-pay | Source: Ambulatory Visit | Attending: Urology

## 2015-06-29 ENCOUNTER — Inpatient Hospital Stay (HOSPITAL_COMMUNITY): Payer: Medicare Other | Admitting: Anesthesiology

## 2015-06-29 ENCOUNTER — Encounter (HOSPITAL_COMMUNITY): Payer: Self-pay | Admitting: *Deleted

## 2015-06-29 ENCOUNTER — Observation Stay (HOSPITAL_COMMUNITY)
Admission: RE | Admit: 2015-06-29 | Discharge: 2015-06-30 | Disposition: A | Discharge status: Home / Self Care | Payer: Medicare Other | Source: Ambulatory Visit | Attending: Urology | Admitting: Urology

## 2015-06-29 DIAGNOSIS — N400 Enlarged prostate without lower urinary tract symptoms: Secondary | ICD-10-CM | POA: Diagnosis present

## 2015-06-29 DIAGNOSIS — E1165 Type 2 diabetes mellitus with hyperglycemia: Secondary | ICD-10-CM | POA: Diagnosis not present

## 2015-06-29 DIAGNOSIS — R3915 Urgency of urination: Secondary | ICD-10-CM | POA: Diagnosis present

## 2015-06-29 DIAGNOSIS — I10 Essential (primary) hypertension: Secondary | ICD-10-CM | POA: Diagnosis not present

## 2015-06-29 DIAGNOSIS — R351 Nocturia: Secondary | ICD-10-CM | POA: Diagnosis not present

## 2015-06-29 DIAGNOSIS — N210 Calculus in bladder: Secondary | ICD-10-CM | POA: Diagnosis not present

## 2015-06-29 DIAGNOSIS — M1990 Unspecified osteoarthritis, unspecified site: Secondary | ICD-10-CM | POA: Diagnosis not present

## 2015-06-29 DIAGNOSIS — M353 Polymyalgia rheumatica: Secondary | ICD-10-CM | POA: Insufficient documentation

## 2015-06-29 DIAGNOSIS — Z7952 Long term (current) use of systemic steroids: Secondary | ICD-10-CM | POA: Insufficient documentation

## 2015-06-29 DIAGNOSIS — M5430 Sciatica, unspecified side: Secondary | ICD-10-CM | POA: Diagnosis not present

## 2015-06-29 DIAGNOSIS — Z79891 Long term (current) use of opiate analgesic: Secondary | ICD-10-CM | POA: Diagnosis not present

## 2015-06-29 DIAGNOSIS — N138 Other obstructive and reflux uropathy: Secondary | ICD-10-CM | POA: Diagnosis not present

## 2015-06-29 DIAGNOSIS — R3121 Asymptomatic microscopic hematuria: Secondary | ICD-10-CM | POA: Diagnosis not present

## 2015-06-29 DIAGNOSIS — Z79899 Other long term (current) drug therapy: Secondary | ICD-10-CM | POA: Diagnosis not present

## 2015-06-29 DIAGNOSIS — D649 Anemia, unspecified: Secondary | ICD-10-CM | POA: Insufficient documentation

## 2015-06-29 DIAGNOSIS — Z7984 Long term (current) use of oral hypoglycemic drugs: Secondary | ICD-10-CM | POA: Diagnosis not present

## 2015-06-29 DIAGNOSIS — R350 Frequency of micturition: Secondary | ICD-10-CM | POA: Diagnosis not present

## 2015-06-29 DIAGNOSIS — I2510 Atherosclerotic heart disease of native coronary artery without angina pectoris: Secondary | ICD-10-CM | POA: Insufficient documentation

## 2015-06-29 DIAGNOSIS — N401 Enlarged prostate with lower urinary tract symptoms: Secondary | ICD-10-CM | POA: Diagnosis not present

## 2015-06-29 DIAGNOSIS — M069 Rheumatoid arthritis, unspecified: Secondary | ICD-10-CM | POA: Insufficient documentation

## 2015-06-29 DIAGNOSIS — R3912 Poor urinary stream: Secondary | ICD-10-CM | POA: Diagnosis not present

## 2015-06-29 HISTORY — PX: CYSTOSCOPY WITH LITHOLAPAXY: SHX1425

## 2015-06-29 HISTORY — PX: HOLMIUM LASER APPLICATION: SHX5852

## 2015-06-29 HISTORY — PX: TRANSURETHRAL RESECTION OF PROSTATE: SHX73

## 2015-06-29 LAB — GLUCOSE, CAPILLARY
GLUCOSE-CAPILLARY: 194 mg/dL — AB (ref 65–99)
GLUCOSE-CAPILLARY: 221 mg/dL — AB (ref 65–99)
Glucose-Capillary: 143 mg/dL — ABNORMAL HIGH (ref 65–99)
Glucose-Capillary: 109 mg/dL — ABNORMAL HIGH (ref 65–99)

## 2015-06-29 SURGERY — TRANSURETHRAL RESECTION OF THE PROSTATE WITH GYRUS INSTRUMENTS
Anesthesia: General | Site: Prostate

## 2015-06-29 MED ORDER — PROPOFOL 10 MG/ML IV BOLUS
INTRAVENOUS | Status: AC
  Filled 2015-06-29: qty 20

## 2015-06-29 MED ORDER — PHENYLEPHRINE 40 MCG/ML (10ML) SYRINGE FOR IV PUSH (FOR BLOOD PRESSURE SUPPORT)
PREFILLED_SYRINGE | INTRAVENOUS | Status: AC
  Filled 2015-06-29: qty 10

## 2015-06-29 MED ORDER — AMLODIPINE BESYLATE 10 MG PO TABS
10.0000 mg | ORAL_TABLET | Freq: Every day | ORAL | Status: DC
  Filled 2015-06-29: qty 1

## 2015-06-29 MED ORDER — NEOSTIGMINE METHYLSULFATE 10 MG/10ML IV SOLN
INTRAVENOUS | Status: DC | PRN
  Administered 2015-06-29: 4 mg via INTRAVENOUS

## 2015-06-29 MED ORDER — DULOXETINE HCL 30 MG PO CPEP
30.0000 mg | ORAL_CAPSULE | Freq: Every day | ORAL | Status: DC
  Administered 2015-06-30: 30 mg via ORAL
  Filled 2015-06-29: qty 1

## 2015-06-29 MED ORDER — LISINOPRIL 40 MG PO TABS
60.0000 mg | ORAL_TABLET | Freq: Every day | ORAL | Status: DC
  Administered 2015-06-29: 60 mg via ORAL
  Filled 2015-06-29 (×2): qty 1

## 2015-06-29 MED ORDER — TEMAZEPAM 15 MG PO CAPS
30.0000 mg | ORAL_CAPSULE | Freq: Every evening | ORAL | Status: DC | PRN
  Administered 2015-06-29: 30 mg via ORAL
  Filled 2015-06-29: qty 2

## 2015-06-29 MED ORDER — HYDROMORPHONE HCL 1 MG/ML IJ SOLN
INTRAMUSCULAR | Status: AC
  Filled 2015-06-29: qty 1

## 2015-06-29 MED ORDER — HYDROCHLOROTHIAZIDE 12.5 MG PO CAPS
12.5000 mg | ORAL_CAPSULE | Freq: Every day | ORAL | Status: DC
  Administered 2015-06-29: 12.5 mg via ORAL
  Filled 2015-06-29 (×2): qty 1

## 2015-06-29 MED ORDER — CIPROFLOXACIN HCL 500 MG PO TABS
500.0000 mg | ORAL_TABLET | Freq: Two times a day (BID) | ORAL | Status: AC
  Administered 2015-06-29: 500 mg via ORAL
  Filled 2015-06-29: qty 1

## 2015-06-29 MED ORDER — EPHEDRINE SULFATE 50 MG/ML IJ SOLN
INTRAMUSCULAR | Status: AC
  Filled 2015-06-29: qty 1

## 2015-06-29 MED ORDER — INSULIN ASPART 100 UNIT/ML ~~LOC~~ SOLN
0.0000 [IU] | SUBCUTANEOUS | Status: DC
  Administered 2015-06-29: 4 [IU] via SUBCUTANEOUS
  Administered 2015-06-29: 8 [IU] via SUBCUTANEOUS

## 2015-06-29 MED ORDER — FENTANYL CITRATE (PF) 100 MCG/2ML IJ SOLN
INTRAMUSCULAR | Status: AC
  Filled 2015-06-29: qty 2

## 2015-06-29 MED ORDER — LACTATED RINGERS IV SOLN
INTRAVENOUS | Status: DC
  Administered 2015-06-29: 1000 mL via INTRAVENOUS

## 2015-06-29 MED ORDER — CIPROFLOXACIN IN D5W 400 MG/200ML IV SOLN
400.0000 mg | INTRAVENOUS | Status: AC
  Administered 2015-06-29: 400 mg via INTRAVENOUS

## 2015-06-29 MED ORDER — CETYLPYRIDINIUM CHLORIDE 0.05 % MT LIQD
7.0000 mL | Freq: Two times a day (BID) | OROMUCOSAL | Status: DC
  Administered 2015-06-29: 7 mL via OROMUCOSAL

## 2015-06-29 MED ORDER — TAMSULOSIN HCL 0.4 MG PO CAPS
0.4000 mg | ORAL_CAPSULE | Freq: Every day | ORAL | Status: DC
  Administered 2015-06-29: 0.4 mg via ORAL
  Filled 2015-06-29 (×2): qty 1

## 2015-06-29 MED ORDER — CIPROFLOXACIN IN D5W 400 MG/200ML IV SOLN
INTRAVENOUS | Status: AC
  Filled 2015-06-29: qty 200

## 2015-06-29 MED ORDER — METOPROLOL SUCCINATE ER 25 MG PO TB24
25.0000 mg | ORAL_TABLET | Freq: Every day | ORAL | Status: DC
  Administered 2015-06-29: 12.5 mg via ORAL
  Filled 2015-06-29: qty 1

## 2015-06-29 MED ORDER — HYDROCODONE-ACETAMINOPHEN 10-325 MG PO TABS
1.0000 | ORAL_TABLET | ORAL | Status: DC | PRN
  Administered 2015-06-29 – 2015-06-30 (×3): 1 via ORAL
  Filled 2015-06-29 (×3): qty 1

## 2015-06-29 MED ORDER — ZOLPIDEM TARTRATE 5 MG PO TABS
5.0000 mg | ORAL_TABLET | Freq: Every evening | ORAL | Status: DC | PRN
  Filled 2015-06-29: qty 1

## 2015-06-29 MED ORDER — FENTANYL CITRATE (PF) 100 MCG/2ML IJ SOLN
INTRAMUSCULAR | Status: DC | PRN
  Administered 2015-06-29 (×2): 25 ug via INTRAVENOUS
  Administered 2015-06-29: 50 ug via INTRAVENOUS

## 2015-06-29 MED ORDER — ONDANSETRON HCL 4 MG/2ML IJ SOLN: 4.0000 mg | Freq: Once | INTRAMUSCULAR | Status: DC | PRN

## 2015-06-29 MED ORDER — HEPARIN SODIUM (PORCINE) 5000 UNIT/ML IJ SOLN
5000.0000 [IU] | Freq: Three times a day (TID) | INTRAMUSCULAR | Status: DC
  Administered 2015-06-29 – 2015-06-30 (×3): 5000 [IU] via SUBCUTANEOUS
  Filled 2015-06-29 (×4): qty 1

## 2015-06-29 MED ORDER — PROPOFOL 10 MG/ML IV BOLUS
INTRAVENOUS | Status: DC | PRN
  Administered 2015-06-29: 140 mg via INTRAVENOUS

## 2015-06-29 MED ORDER — SUCCINYLCHOLINE CHLORIDE 20 MG/ML IJ SOLN
INTRAMUSCULAR | Status: DC | PRN
  Administered 2015-06-29: 100 mg via INTRAVENOUS

## 2015-06-29 MED ORDER — EPHEDRINE SULFATE 50 MG/ML IJ SOLN
INTRAMUSCULAR | Status: DC | PRN
  Administered 2015-06-29: 10 mg via INTRAVENOUS
  Administered 2015-06-29 (×2): 5 mg via INTRAVENOUS

## 2015-06-29 MED ORDER — FENTANYL 25 MCG/HR TD PT72: 25.0000 ug | MEDICATED_PATCH | TRANSDERMAL | Status: DC

## 2015-06-29 MED ORDER — CHLORHEXIDINE GLUCONATE 0.12 % MT SOLN
15.0000 mL | Freq: Two times a day (BID) | OROMUCOSAL | Status: DC
  Administered 2015-06-29 – 2015-06-30 (×3): 15 mL via OROMUCOSAL
  Filled 2015-06-29 (×3): qty 15

## 2015-06-29 MED ORDER — FENTANYL CITRATE (PF) 100 MCG/2ML IJ SOLN
25.0000 ug | INTRAMUSCULAR | Status: DC | PRN
  Administered 2015-06-29 (×3): 50 ug via INTRAVENOUS

## 2015-06-29 MED ORDER — SODIUM CHLORIDE 0.9 % IV SOLN
INTRAVENOUS | Status: DC
  Administered 2015-06-29 – 2015-06-30 (×2): via INTRAVENOUS

## 2015-06-29 MED ORDER — ROCURONIUM BROMIDE 100 MG/10ML IV SOLN
INTRAVENOUS | Status: DC | PRN
  Administered 2015-06-29 (×2): 20 mg via INTRAVENOUS
  Administered 2015-06-29: 10 mg via INTRAVENOUS

## 2015-06-29 MED ORDER — ROCURONIUM BROMIDE 100 MG/10ML IV SOLN
INTRAVENOUS | Status: AC
  Filled 2015-06-29: qty 1

## 2015-06-29 MED ORDER — NEOSTIGMINE METHYLSULFATE 10 MG/10ML IV SOLN
INTRAVENOUS | Status: AC
  Filled 2015-06-29: qty 1

## 2015-06-29 MED ORDER — SODIUM CHLORIDE 0.9 % IR SOLN
3000.0000 mL | Status: DC
  Administered 2015-06-29: 3000 mL

## 2015-06-29 MED ORDER — PREGABALIN 75 MG PO CAPS
75.0000 mg | ORAL_CAPSULE | Freq: Every day | ORAL | Status: DC
  Administered 2015-06-29 – 2015-06-30 (×2): 75 mg via ORAL
  Filled 2015-06-29 (×2): qty 1

## 2015-06-29 MED ORDER — PHENYLEPHRINE HCL 10 MG/ML IJ SOLN
INTRAMUSCULAR | Status: DC | PRN
  Administered 2015-06-29: 40 ug via INTRAVENOUS

## 2015-06-29 MED ORDER — SODIUM CHLORIDE 0.9 % IR SOLN
Status: DC | PRN
  Administered 2015-06-29: 1000 mL
  Administered 2015-06-29 (×5): 3000 mL

## 2015-06-29 MED ORDER — INSULIN ASPART 100 UNIT/ML ~~LOC~~ SOLN
SUBCUTANEOUS | Status: AC
  Filled 2015-06-29: qty 1

## 2015-06-29 MED ORDER — LIDOCAINE HCL (CARDIAC) 20 MG/ML IV SOLN
INTRAVENOUS | Status: AC
  Filled 2015-06-29: qty 5

## 2015-06-29 MED ORDER — PHENYLEPHRINE HCL 10 MG/ML IJ SOLN
20.0000 mg | INTRAVENOUS | Status: DC | PRN
  Administered 2015-06-29: 25 ug/min via INTRAVENOUS

## 2015-06-29 MED ORDER — METOPROLOL SUCCINATE 12.5 MG HALF TABLET
12.5000 mg | ORAL_TABLET | Freq: Every day | ORAL | Status: DC
Start: 2015-06-30 — End: 2015-06-29
  Filled 2015-06-29: qty 1

## 2015-06-29 MED ORDER — DOCUSATE SODIUM 100 MG PO CAPS
100.0000 mg | ORAL_CAPSULE | Freq: Two times a day (BID) | ORAL | Status: DC
  Administered 2015-06-29 – 2015-06-30 (×3): 100 mg via ORAL
  Filled 2015-06-29 (×4): qty 1

## 2015-06-29 MED ORDER — LIDOCAINE HCL (CARDIAC) 20 MG/ML IV SOLN
INTRAVENOUS | Status: DC | PRN
  Administered 2015-06-29: 100 mg via INTRAVENOUS

## 2015-06-29 MED ORDER — POTASSIUM CHLORIDE CRYS ER 20 MEQ PO TBCR
20.0000 meq | EXTENDED_RELEASE_TABLET | Freq: Every day | ORAL | Status: DC
  Administered 2015-06-29: 20 meq via ORAL
  Filled 2015-06-29 (×2): qty 1

## 2015-06-29 MED ORDER — SODIUM CHLORIDE 0.9 % IJ SOLN
INTRAMUSCULAR | Status: AC
Start: 2015-06-29 — End: 2015-06-29
  Filled 2015-06-29: qty 10

## 2015-06-29 MED ORDER — HYDROMORPHONE HCL 1 MG/ML IJ SOLN
0.2500 mg | INTRAMUSCULAR | Status: DC | PRN
  Administered 2015-06-29 (×4): 0.5 mg via INTRAVENOUS

## 2015-06-29 MED ORDER — FENTANYL CITRATE (PF) 100 MCG/2ML IJ SOLN
INTRAMUSCULAR | Status: AC
  Filled 2015-06-29: qty 4

## 2015-06-29 MED ORDER — MORPHINE SULFATE (PF) 2 MG/ML IV SOLN: 2.0000 mg | INTRAVENOUS | Status: DC | PRN

## 2015-06-29 MED ORDER — PHENYLEPHRINE HCL 10 MG/ML IJ SOLN
INTRAMUSCULAR | Status: AC
  Filled 2015-06-29: qty 2

## 2015-06-29 MED ORDER — ONDANSETRON HCL 4 MG/2ML IJ SOLN
INTRAMUSCULAR | Status: DC | PRN
  Administered 2015-06-29: 4 mg via INTRAVENOUS

## 2015-06-29 MED ORDER — ONDANSETRON HCL 4 MG/2ML IJ SOLN
INTRAMUSCULAR | Status: AC
  Filled 2015-06-29: qty 2

## 2015-06-29 MED ORDER — GLYCOPYRROLATE 0.2 MG/ML IJ SOLN
INTRAMUSCULAR | Status: AC
  Filled 2015-06-29: qty 3

## 2015-06-29 MED ORDER — METOPROLOL SUCCINATE ER 25 MG PO TB24
12.5000 mg | ORAL_TABLET | Freq: Every day | ORAL | Status: DC
  Filled 2015-06-29: qty 1

## 2015-06-29 MED ORDER — ONDANSETRON HCL 4 MG/2ML IJ SOLN: 4.0000 mg | INTRAMUSCULAR | Status: DC | PRN

## 2015-06-29 MED ORDER — METHOCARBAMOL 500 MG PO TABS: 500.0000 mg | ORAL_TABLET | Freq: Two times a day (BID) | ORAL | Status: DC | PRN

## 2015-06-29 MED ORDER — PREDNISONE 5 MG PO TABS
5.0000 mg | ORAL_TABLET | Freq: Every day | ORAL | Status: DC
  Administered 2015-06-30: 5 mg via ORAL
  Filled 2015-06-29: qty 1

## 2015-06-29 MED ORDER — PANTOPRAZOLE SODIUM 20 MG PO TBEC
20.0000 mg | DELAYED_RELEASE_TABLET | Freq: Every day | ORAL | Status: DC
  Administered 2015-06-30: 20 mg via ORAL
  Filled 2015-06-29: qty 1

## 2015-06-29 MED ORDER — GLYCOPYRROLATE 0.2 MG/ML IJ SOLN
INTRAMUSCULAR | Status: DC | PRN
  Administered 2015-06-29: .6 mg via INTRAVENOUS

## 2015-06-29 SURGICAL SUPPLY — 20 items
BAG URINE DRAINAGE (UROLOGICAL SUPPLIES) ×2 IMPLANT
BAG URO CATCHER STRL LF (DRAPE) ×4 IMPLANT
CATH HEMA 3WAY 30CC 24FR COUDE (CATHETERS) ×2 IMPLANT
ELECT REM PT RETURN 9FT ADLT (ELECTROSURGICAL)
ELECTRODE REM PT RTRN 9FT ADLT (ELECTROSURGICAL) IMPLANT
EVACUATOR ELLICK (MISCELLANEOUS) ×4 IMPLANT
EVACUATOR MICROVAS BLADDER (UROLOGICAL SUPPLIES) ×2 IMPLANT
FIBER LASER FLEXIVA 1000 (UROLOGICAL SUPPLIES) ×4 IMPLANT
GLOVE BIOGEL M STRL SZ7.5 (GLOVE) ×4 IMPLANT
GLOVE BIOGEL PI IND STRL 6.5 (GLOVE) IMPLANT
GLOVE BIOGEL PI IND STRL 7.0 (GLOVE) IMPLANT
GLOVE BIOGEL PI INDICATOR 6.5 (GLOVE) ×2
GLOVE BIOGEL PI INDICATOR 7.0 (GLOVE) ×2
GOWN STRL REUS W/TWL XL LVL3 (GOWN DISPOSABLE) ×8 IMPLANT
LOOP CUT BIPOLAR 24F LRG (ELECTROSURGICAL) ×2 IMPLANT
MANIFOLD NEPTUNE II (INSTRUMENTS) ×4 IMPLANT
PACK CYSTO (CUSTOM PROCEDURE TRAY) ×4 IMPLANT
SYR 30ML LL (SYRINGE) ×2 IMPLANT
TUBING CONNECTING 10 (TUBING) ×3 IMPLANT
TUBING CONNECTING 10' (TUBING) ×1

## 2015-06-29 NOTE — Op Note (Signed)
Preoperative diagnosis: Benign prostatic hyperplasia, bladder stone  Postoperative diagnosis: Same  Procedure: Cystolitholapaxy, transurethral resection of prostate  Surgeon: Dr. Baruch Gouty  Anesthesia: Gen.  Findings: Large bladder stone, visually obstructive prostate  Drains: 24 French hematuria catheter on CBI  Specimens: Bladder stone, prostate chips  Disposition: Stable to postanesthesia care unit  Indications for procedure: The patient is a 79 year old gentleman with symptomatic BPH including a dribbling stream with signs of early bladder decompensation on cystoscopy and a large bladder stone. He also had microscopic hematuria with a negative hematuria workup except for the bladder stone.  Description of procedure: The patient was met in the preoperative area. All risks, benefits, and indications of the procedure were described in great detail. The patient consented to the procedure. Preoperative antibiotics were given. He was taken back to the operative theater. Gen. anesthesia was induced per the anesthesia service. The patient was placed in the dorsal lithotomy position. He was prepped and draped in usual sterile fashion. A timeout was called. A 21 French 30 cystoscope lens was inserted into the patient's bladder per urethra atraumatically. He had trilobar hypertrophy with significant median lobe.  He had a large approximately 3 cm bladder stone. This was broken into small  pieces with the holmium laser. This took approximately one hour. All fragments removed with an Engineer, maintenance. These were sent to pathology. The cystoscope was then exchanged for a 24's French resectoscope sheath. Transurethral resection of prostate then took place starting with the median lobe. After complete transurethral resection of the prostate in all 360 fashion, the prostate was visually unobstructed. All prostate chips were evacuated Ellik evacuator and sent to pathology. After all chips were confirmed  to be removed, hemostasis was excellent. The bilateral ureteral orifices as well as the verumontanum were visualized and were intact. The resector was then removed. A 24 French hematuria catheter was then placed per urethra with 40 cc in the balloon. He was started on CBI. This was light pink. The patient was then woken from anesthesia and transferred in stable condition to the postanesthesia care unit.  Plan: The patient was admitted to the floor overnight. The goal will be to remove the Foley catheter in the morning.

## 2015-06-29 NOTE — Anesthesia Procedure Notes (Signed)
Procedure Name: Intubation Date/Time: 06/29/2015 10:10 AM Performed by: Jarvis Newcomer A Pre-anesthesia Checklist: Patient identified, Emergency Drugs available, Suction available, Patient being monitored and Timeout performed Patient Re-evaluated:Patient Re-evaluated prior to inductionOxygen Delivery Method: Circle system utilized Preoxygenation: Pre-oxygenation with 100% oxygen Intubation Type: IV induction Ventilation: Mask ventilation without difficulty and Oral airway inserted - appropriate to patient size Laryngoscope Size: Glidescope and 3 Grade View: Grade I Tube type: Oral Tube size: 7.5 mm Number of attempts: 1 Airway Equipment and Method: Video-laryngoscopy and Stylet Placement Confirmation: ETT inserted through vocal cords under direct vision,  positive ETCO2 and breath sounds checked- equal and bilateral Secured at: 21 cm Tube secured with: Tape Dental Injury: Teeth and Oropharynx as per pre-operative assessment  Comments: Elective Glidescope intubation due to decreased neck mobility on airway exam.

## 2015-06-29 NOTE — Anesthesia Postprocedure Evaluation (Signed)
  Anesthesia Post-op Note  Patient: [name]  Procedure(s) Performed: Procedure(s) (LRB): TRANSURETHRAL RESECTION OF THE PROSTATE  (N/A) CYSTOSCOPY WITH LITHOLAPAXY (N/A) HOLMIUM LASER APPLICATION (N/A)  Patient Location: PACU  Anesthesia Type: General  Level of Consciousness: awake and alert   Airway and Oxygen Therapy: Patient Spontanous Breathing  Post-op Pain: mild  Post-op Assessment: Post-op Vital signs reviewed, Patient's Cardiovascular Status Stable, Respiratory Function Stable, Patent Airway and No signs of Nausea or vomiting  Last Vitals:  Filed Vitals:   06/29/15 1326  BP: 172/78  Pulse: 64  Temp: 36.8 C  Resp: 14    Post-op Vital Signs: stable   Complications: No apparent anesthesia complications

## 2015-06-29 NOTE — Anesthesia Preprocedure Evaluation (Addendum)
Anesthesia Evaluation  Patient identified by MRN, date of birth, ID band Patient awake    Reviewed: Allergy & Precautions, NPO status , Patient's Chart, lab work & pertinent test results, reviewed documented beta blocker date and time   History of Anesthesia Complications Negative for: history of anesthetic complications  Airway Mallampati: III  TM Distance: <3 FB Neck ROM: Limited    Dental  (+) Teeth Intact, Dental Advisory Given   Pulmonary neg pulmonary ROS,    Pulmonary exam normal breath sounds clear to auscultation       Cardiovascular hypertension, Pt. on medications and Pt. on home beta blockers + CAD  Normal cardiovascular exam+ Valvular Problems/Murmurs AI  Rhythm:Regular Rate:Normal  TTE 01/01/14: Study Conclusions  - Left ventricle: The cavity size was normal. Wall thickness wasincreased in a pattern of mild LVH. Systolic function was normal.The estimated ejection fraction was in the range of 60% to 65%.Wall motion was normal; there were no regional wall motionabnormalities. There was an increased relative contribution ofatrial contraction to ventricular filling. - Aortic valve: There was trivial regurgitation. Mean gradient (S):11 mm Hg. Peak gradient (S): 20 mm Hg. - Left atrium: The atrium was mildly dilated.   Neuro/Psych PSYCHIATRIC DISORDERS Anxiety  Neuromuscular disease (sciatica)    GI/Hepatic negative GI ROS, Neg liver ROS,   Endo/Other  diabetes, Type 2, Oral Hypoglycemic Agents  Renal/GU negative Renal ROS     Musculoskeletal negative musculoskeletal ROS (+) Arthritis  (s/p ACDF), Osteoarthritis and Rheumatoid disorders,  Polymyalgia rheumatica    Abdominal   Peds  Hematology negative hematology ROS (+) Blood dyscrasia, anemia ,   Anesthesia Other Findings Day of surgery medications reviewed with the patient.  Reproductive/Obstetrics                           Anesthesia Physical Anesthesia Plan  ASA: III  Anesthesia Plan: General   Post-op Pain Management:    Induction: Intravenous  Airway Management Planned: Oral ETT and Video Laryngoscope Planned  Additional Equipment:   Intra-op Plan:   Post-operative Plan:   Informed Consent:   Dental advisory given  Plan Discussed with:   Anesthesia Plan Comments:        Anesthesia Quick Evaluation

## 2015-06-29 NOTE — Progress Notes (Signed)
PHARMACY NOTE -    Pharmacy has been consulted to renally adjust antibiotics. Cipro x 1 ordered,  need for further dosage adjustment appears unlikely at present.    Will sign off at this time.  Please reconsult if a change in clinical status warrants re-evaluation of dosage.  Arley Phenix RPh 06/29/2015, 2:06 PM Pager [phone]

## 2015-06-29 NOTE — Transfer of Care (Signed)
Immediate Anesthesia Transfer of Care Note  Patient: [name]  Procedure(s) Performed: Procedure(s): TRANSURETHRAL RESECTION OF THE PROSTATE  (N/A) CYSTOSCOPY WITH LITHOLAPAXY (N/A) HOLMIUM LASER APPLICATION (N/A)  Patient Location: PACU  Anesthesia Type:General  Level of Consciousness: awake, alert , oriented and patient cooperative  Airway & Oxygen Therapy: Patient Spontanous Breathing and Patient connected to face mask oxygen  Post-op Assessment: Report given to RN, Post -op Vital signs reviewed and stable and Patient moving all extremities  Post vital signs: Reviewed and stable  Last Vitals:  Filed Vitals:   06/29/15 0827  BP: 175/71  Pulse: 58  Temp: 36.8 C  Resp: 18    Complications: No apparent anesthesia complications

## 2015-06-29 NOTE — H&P (Signed)
Chief Complaint Elevated PSA    Referring Provider: Dr. Joycelyn Rua   History of Present Illness The patient is a 79 year old gentleman who presents with obstructive voiding symptoms. The patient's complains mostly about his urgency and his weak stream. He notes that he drips urine does not have a strong stream. He denies spraying of urine. He also notes nocturia 3. He keeps a urinary bilateral ears bedside. He also has daytime frequency and dysuria. He is currently on Flomax.    Also of note, incision urinalysis he was found to have 3-10 red blood cells. His never had blood in his urine before. His PVR today was 25.    His IPSS score today was 23/3.   Past Medical History Problems  1. History of arthritis (Z87.39) 2. History of cardiac murmur (Z86.79) 3. History of diabetes mellitus (Z86.39) 4. History of heartburn (Z87.898) 5. History of hypertension (Z86.79)  Surgical History Problems  1. History of Back Surgery 2. History of Knee Surgery 3. History of Lumbar Vertebral Fusion 4. History of Neck Surgery  Current Meds 1. AmLODIPine Besylate 10 MG Oral Tablet;  Therapy: (Recorded:18Oct2016) to Recorded 2. Calcium Acetate CAPS;  Therapy: (Recorded:18Oct2016) to Recorded 3. Cymbalta 20 MG Oral Capsule Delayed Release Particles;  Therapy: (Recorded:18Oct2016) to Recorded 4. FentaNYL 25 MCG/HR Transdermal Patch 72 Hour;  Therapy: (Recorded:18Oct2016) to Recorded 5. Hydrochlorothiazide 12.5 MG Oral Tablet;  Therapy: (Recorded:18Oct2016) to Recorded 6. Hydrocodone-Acetaminophen 10-325 MG/15ML Oral Solution;  Therapy: (Recorded:18Oct2016) to Recorded 7. Klor-Con 20 MEQ Oral Packet;  Therapy: (Recorded:18Oct2016) to Recorded 8. Leflunomide 20 MG Oral Tablet;  Therapy: (Recorded:18Oct2016) to Recorded 9. Lisinopril 20 MG Oral Tablet;  Therapy: (Recorded:18Oct2016) to Recorded 10. Metoprolol Succinate ER 25 MG Oral Tablet Extended Release 24 Hour;   Therapy:  (Recorded:18Oct2016) to Recorded 11. Multi-Day Vitamins TABS;   Therapy: (Recorded:18Oct2016) to Recorded 12. PredniSONE 5 MG Oral Tablet;   Therapy: (Recorded:18Oct2016) to Recorded 13. Vitamin D3 CAPS;   Therapy: (Recorded:18Oct2016) to Recorded  Allergies Medication  1. Sulfa Drugs  Family History Problems  1. Family history of renal failure (Z84.1) : Father  Social History Problems    Denied: History of Alcohol use   Caffeine use (F15.90)   Father deceased   Married   Mother deceased   Never a smoker   Number of children   Occupation  Review of Systems  Genitourinary: urinary frequency, feelings of urinary urgency, dysuria, nocturia and weak urinary stream.  Constitutional: feeling tired (fatigue).  Musculoskeletal: back pain and joint pain.    Vitals Vital Signs [Data Includes: Last 1 Day]  Recorded: 18Oct2016 03:31PM  Height: 6 ft 3 in Weight: 188 lb  BMI Calculated: 23.5 BSA Calculated: 2.14 Blood Pressure: 176 / 75 Temperature: 99.4 F Heart Rate: 60 Respiration: 18  Physical Exam Constitutional: Well nourished . No acute distress.  ENT:. The ears and nose are normal in appearance.  Neck: The appearance of the neck is normal.  Pulmonary: No respiratory distress.  Cardiovascular:. No peripheral edema.  Abdomen: The abdomen is not distended. The abdomen is soft and nontender.  Rectal: Rectal exam demonstrates normal sphincter tone. The prostate is smooth and flat . Estimated prostate size is 3+. The prostate has no nodularity, is not indurated, is not tender and is not fluctuant.  Genitourinary: Examination of the penis demonstrates no lesions and a normal meatus. The scrotum is normal in appearance. The right testis is palpably normal and without masses. The left testis is normal and without masses.  Skin: Normal  skin turgor and no visible rash.  Neuro/Psych:. Mood and affect are appropriate.    Results/Data Urine [Data Includes: Last 1 Day]    18Oct2016  COLOR YELLOW   APPEARANCE CLEAR   SPECIFIC GRAVITY >1.030   pH 5.5   GLUCOSE 2+   BILIRUBIN NEGATIVE   KETONE NEGATIVE   BLOOD 2+   PROTEIN TRACE   NITRITE NEGATIVE   LEUKOCYTE ESTERASE NEGATIVE   SQUAMOUS EPITHELIAL/HPF 0-5 HPF  WBC 6-10 WBC/HPF  RBC 3-10 RBC/HPF  BACTERIA NONE SEEN HPF  CRYSTALS NONE SEEN HPF  CASTS NONE SEEN LPF  Yeast NONE SEEN HPF   Flow Rate: Voided 95 ml. A peak flow rate of 30ml/s and mean flow rate of 43ml/s.    Procedure  Procedure: Cystoscopy   Indication: Hematuria. Lower Urinary Tract Symptoms.  Informed Consent: Risks, benefits, and potential adverse events were discussed and informed consent was obtained from the patient.  Prep: The patient was prepped with betadine.  Anesthesia:. Local anesthesia was administered intraurethrally with 2% lidocaine jelly.  Antibiotic prophylaxis: Ciprofloxacin.  Procedure Note:  Urethral meatus:. No abnormalities.  Anterior urethra: No abnormalities.  Prostatic urethra:. Estimated length was 7 cm cm. There was visual obstruction of the prostatic urethra. The lateral and median prostatic lobes were enlarged. An enlarged intravesical median lobe was visualized.  Bladder: Visulization was clear. The mucosa was smooth without abnormalities. Examination of the bladder demonstrated trabeculation and a diverticulum. A single  a stone was present in the bladder measuring approximately 3 cm in size. No tumors    Assessment Assessed  1. BPH (benign prostatic hyperplasia) (N40.0) 2. Asymptomatic microscopic hematuria (R31.21)  Plan Asymptomatic microscopic hematuria  1. AU CT-HEMATURIA PROTOCOL; Status:Hold For - Appointment,PreCert,Date of  Service,Print; Requested for:18Oct2016;  2. BASIC METABOLIC PANEL; Status:Hold For - Specimen/Data Collection,Appointment;  Requested for:18Oct2016;  3. Complex Uroflowmetry; Status:Hold For - Appointment,Date of Service; Requested  for:18Oct2016;  4. IPSS  Questionnarie; Status:Hold For - Appointment,Date of Service; Requested  for:18Oct2016;  5. PVR U/S; Status:Hold For - Appointment,Date of Service; Requested for:18Oct2016;  6. Follow-up Month x 3 Office  Follow-up with IPSS, Uroflow, PVR  Status: Hold For -  Appointment,Date of Service  Requested for: 18Oct2016 BPH (benign prostatic hyperplasia)  7. Complex Uroflowmetry; Status:Hold For - Appointment,Date of Service; Requested  for:18Oct2016;  8. Cysto; Status:Hold For - Appointment,Date of Service; Requested for:18Oct2016;  Health Maintenance  9. UA With REFLEX; [Do Not Release]; Status:Complete;   Done: 18Oct2016 03:11PM  1. BPH with LUTS  The patient has a large visibly obstructive trilobar hypertrophy with a median lobe and signs of early bladder decompensation with multiple diverticula. I discussed the patient given the damage or done his bladder, I would be his best interest to surgically reduce the size of the prostate. TURP. I discussed the risks, benefits, indications, and alternatives to surgery. He understands the risks include, but are not limited to, infection and bleeding. He is agreeable to proceeding.    2. Bladder Calculus  -Cystolitholapaxy during TURP    3. Microscopic hematuria  -Likely secondary to a bladder stone. I discussed [place] with the patient he still would like to proceed with a full hematuria workup.  -CT urogram  -Check creatinine today

## 2015-06-30 DIAGNOSIS — N401 Enlarged prostate with lower urinary tract symptoms: Secondary | ICD-10-CM | POA: Diagnosis not present

## 2015-06-30 LAB — GLUCOSE, CAPILLARY
GLUCOSE-CAPILLARY: 55 mg/dL — AB (ref 65–99)
GLUCOSE-CAPILLARY: 88 mg/dL (ref 65–99)
Glucose-Capillary: 126 mg/dL — ABNORMAL HIGH (ref 65–99)
Glucose-Capillary: 144 mg/dL — ABNORMAL HIGH (ref 65–99)

## 2015-06-30 LAB — CBC
HCT: 30.7 % — ABNORMAL LOW (ref 39.0–52.0)
HEMOGLOBIN: 10 g/dL — AB (ref 13.0–17.0)
MCH: 29.5 pg (ref 26.0–34.0)
MCHC: 32.6 g/dL (ref 30.0–36.0)
MCV: 90.6 fL (ref 78.0–100.0)
PLATELETS: 191 10*3/uL (ref 150–400)
RBC: 3.39 MIL/uL — ABNORMAL LOW (ref 4.22–5.81)
RDW: 13.8 % (ref 11.5–15.5)
WBC: 5.9 10*3/uL (ref 4.0–10.5)

## 2015-06-30 LAB — BASIC METABOLIC PANEL
Anion gap: 7 (ref 5–15)
BUN: 15 mg/dL (ref 6–20)
CALCIUM: 8.4 mg/dL — AB (ref 8.9–10.3)
CHLORIDE: 102 mmol/L (ref 101–111)
CO2: 30 mmol/L (ref 22–32)
CREATININE: 1.02 mg/dL (ref 0.61–1.24)
Glucose, Bld: 161 mg/dL — ABNORMAL HIGH (ref 65–99)
Potassium: 3.8 mmol/L (ref 3.5–5.1)
SODIUM: 139 mmol/L (ref 135–145)

## 2015-06-30 MED ORDER — HYDROCODONE-ACETAMINOPHEN 10-325 MG PO TABS: 1.0000 | ORAL_TABLET | ORAL | Status: AC | PRN

## 2015-06-30 MED ORDER — DOCUSATE SODIUM 100 MG PO CAPS: 100.0000 mg | ORAL_CAPSULE | Freq: Two times a day (BID) | ORAL | Status: DC

## 2015-06-30 MED ORDER — CIPROFLOXACIN HCL 500 MG PO TABS: 500.0000 mg | ORAL_TABLET | Freq: Two times a day (BID) | ORAL | Status: DC

## 2015-06-30 NOTE — Progress Notes (Signed)
Hypoglycemic Event  CBG: 55  Treatment: 15 GM carbohydrate snack  Symptoms: None  Follow-up CBG: Time:0039 CBG Result:88  Possible Reasons for Event: Medication regimen: Pt doesn't take insulin at home, and received 8 units.  Comments/MD notified:    [name]

## 2015-06-30 NOTE — Progress Notes (Signed)
No events overnight No n/v/f/c Pain controlled  Filed Vitals:   06/29/15 1326 06/29/15 1741 06/29/15 2356 06/30/15 0355  BP: 172/78 152/68 170/78 147/74  Pulse: 64 60 60 62  Temp: 98.2 F (36.8 C) 98.4 F (36.9 C) 98.7 F (37.1 C) 98.3 F (36.8 C)  TempSrc:  Oral Oral Oral  Resp: 14 14 15 16   Height:      Weight:      SpO2: 100% 100% 100% 100%   CBI  NAD Soft NT ND Foley clear. CBI off  CBC    Component Value Date/Time   WBC 5.9 06/30/2015 0530   RBC 3.39* 06/30/2015 0530   HGB 10.0* 06/30/2015 0530   HCT 30.7* 06/30/2015 0530   PLT 191 06/30/2015 0530   MCV 90.6 06/30/2015 0530   MCH 29.5 06/30/2015 0530   MCHC 32.6 06/30/2015 0530   RDW 13.8 06/30/2015 0530   LYMPHSABS 0.9 08/02/2013 1249   MONOABS 0.7 08/02/2013 1249   EOSABS 0.1 08/02/2013 1249   BASOSABS 0.0 08/02/2013 1249    POD 1 TURP, cystolitholapaxy. Doing wel -d/c foley -check PVR x2 -d/c home if PVR < 200 cc

## 2015-06-30 NOTE — Discharge Summary (Signed)
Date of admission: 06/29/2015  Date of discharge: 06/30/2015  Admission diagnosis: BPH, bladder stone  Discharge diagnosis: same  Secondary diagnoses:  Patient Active Problem List   Diagnosis Date Noted  . BPH (benign prostatic hyperplasia) 06/29/2015  . Aortic valve disease 12/17/2013  . GAD (generalized anxiety disorder) 08/06/2013  . Hypokalemia 08/06/2013  . Rheumatoid arthritis(714.0) 07/10/2011  . Polymyalgia rheumatica ([place]) 01/10/2011  . Benign hypertensive heart disease without heart failure 01/10/2011  . Dyslipidemia 01/10/2011  . Type II or unspecified type diabetes mellitus without mention of complication, uncontrolled 01/10/2011    History and Physical: For full details, please see admission history and physical. Briefly, [name] is a 79 y.o. year old patient with bladder stone and BPH who underwent TURP and cystolitholapaxy.   Hospital Course: Patient tolerated the procedure well.  He was then transferred to the floor after an uneventful PACU stay.  His hospital course was uncomplicated.  Foley removed POD 1 for trial of void.  On POD#1 he had met discharge criteria: was eating a regular diet, was up and ambulating independently,  pain was well controlled, was voiding without a catheter with low PVRs, and was ready to for discharge.   Laboratory values:   Recent Labs  06/30/15 0530  WBC 5.9  HGB 10.0*  HCT 30.7*    Recent Labs  06/30/15 0530  NA 139  K 3.8  CL 102  CO2 30  GLUCOSE 161*  BUN 15  CREATININE 1.02  CALCIUM 8.4*   No results for input(s): LABPT, INR in the last 72 hours. No results for input(s): LABURIN in the last 72 hours. Results for orders placed or performed during the hospital encounter of 06/21/15  Urine culture     Status: None   Collection Time: 06/21/15 11:50 AM  Result Value Ref Range Status   Specimen Description URINE, CLEAN CATCH  Final   Special Requests NONE  Final   Culture   Final    NO GROWTH 1 DAY Performed  at [facility]    Report Status 06/22/2015 FINAL  Final    Disposition: Home  Discharge instruction: The patient was instructed to be ambulatory but told to refrain from heavy lifting, strenuous activity, or driving.   Discharge medications:   Medication List    TAKE these medications        amLODipine 10 MG tablet  Commonly known as:  NORVASC  TAKE 1 TABLET (10 MG TOTAL) BY MOUTH EVERY MORNING.     B-complex with vitamin C tablet  Take 1 tablet by mouth daily.     ciprofloxacin 500 MG tablet  Commonly known as:  CIPRO  Take 1 tablet (500 mg total) by mouth 2 (two) times daily.     CITRACAL + D PO  Take 1 tablet by mouth daily.     cyanocobalamin 1000 MCG/ML injection  Commonly known as:  (VITAMIN B-12)  INJECT 1ML INTO the MUSCLE MONTHLY     docusate sodium 100 MG capsule  Commonly known as:  COLACE  Take 1 capsule (100 mg total) by mouth 2 (two) times daily.     DULoxetine 30 MG capsule  Commonly known as:  CYMBALTA  Take 30 mg by mouth daily.     fentaNYL 25 MCG/HR patch  Commonly known as:  DURAGESIC - dosed mcg/hr  Place 25 mcg onto the skin every other day.     GLUCOSAMINE-CHONDROITIN PO  Take 1 tablet by mouth daily.  hydrochlorothiazide 12.5 MG capsule  Commonly known as:  MICROZIDE  TAKE 1 CAPSULE (12.5 MG TOTAL) BY MOUTH DAILY.     HYDROcodone-acetaminophen 10-325 MG tablet  Commonly known as:  NORCO  Take 1 tablet by mouth every 4 (four) hours as needed for moderate pain.     lansoprazole 15 MG capsule  Commonly known as:  PREVACID  Take 15 mg by mouth daily.     leflunomide 20 MG tablet  Commonly known as:  ARAVA  Take 20 mg by mouth every morning.     lisinopril 20 MG tablet  Commonly known as:  PRINIVIL,ZESTRIL  TAKE 3 TABLETS (60 MG TOTAL) BY MOUTH DAILY.     LYRICA 75 MG capsule  Generic drug:  pregabalin  Take 75 mg by mouth daily.     metFORMIN 500 MG tablet  Commonly known as:  GLUCOPHAGE  TAKE 2 TABLETS (1,000 MG  TOTAL) BY MOUTH DAILY WITH BREAKFAST.     methocarbamol 500 MG tablet  Commonly known as:  ROBAXIN  Take 500 mg by mouth 2 (two) times daily as needed for muscle spasms.     metoprolol succinate 25 MG 24 hr tablet  Commonly known as:  TOPROL-XL  Take 0.5 tablets (12.5 mg total) by mouth daily.     multivitamin with minerals Tabs tablet  Take 1 tablet by mouth daily.     NASACORT ALLERGY 24HR 55 MCG/ACT Aero nasal inhaler  Generic drug:  triamcinolone  Place 1 spray into the nose daily as needed (allergies).     potassium chloride SA 20 MEQ tablet  Commonly known as:  K-DUR,KLOR-CON  Take 1 tablet (20 mEq total) by mouth daily.     predniSONE 5 MG tablet  Commonly known as:  DELTASONE  Take 5 mg by mouth every morning.     Slick ARIA IV  Inject into the vein. Receives at rheumatologist office     tamsulosin 0.4 MG Caps capsule  Commonly known as:  FLOMAX  Take 0.4 mg by mouth daily.     temazepam 30 MG capsule  Commonly known as:  RESTORIL  TAKE ONE CAPSULE BY MOUTH AT BEDTIME AS NEEDED FOR SLEEP     Vitamin D 2000 UNITS Caps  Take 6,000 Units by mouth daily.        Followup:      Follow-up Information    Follow up with Nickie Retort, MD In 2 weeks.   Specialty:  Urology   Contact information:   [address] [place] [address] [phone]

## 2015-06-30 NOTE — Care Management Note (Signed)
Case Management Note  Patient Details  Name: [name] MRN: [id number] Date of Birth: Aug 12, 1936  Subjective/Objective:  79 y/o m admitted w/BPH. From home.                  Action/Plan:d/c home no needs or orders.   Expected Discharge Date:                  Expected Discharge Plan:  Home/Self Care  In-House Referral:     Discharge planning Services  CM Consult  Post Acute Care Choice:    Choice offered to:     DME Arranged:    DME Agency:     HH Arranged:    HH Agency:     Status of Service:  Completed, signed off  Medicare Important Message Given:    Date Medicare IM Given:    Medicare IM give by:    Date Additional Medicare IM Given:    Additional Medicare Important Message give by:     If discussed at Long Length of Stay Meetings, dates discussed:    Additional Comments:  Lanier Clam, RN 06/30/2015, 10:24 AM

## 2015-07-22 ENCOUNTER — Other Ambulatory Visit: Payer: Medicare Other

## 2015-07-26 ENCOUNTER — Ambulatory Visit (INDEPENDENT_AMBULATORY_CARE_PROVIDER_SITE_OTHER): Payer: Medicare Other | Admitting: Cardiology

## 2015-07-26 ENCOUNTER — Encounter: Payer: Self-pay | Admitting: Cardiology

## 2015-07-26 VITALS — BP 118/60 | HR 74 | Ht 75.0 in | Wt 179.8 lb

## 2015-07-26 DIAGNOSIS — I119 Hypertensive heart disease without heart failure: Secondary | ICD-10-CM

## 2015-07-26 DIAGNOSIS — F411 Generalized anxiety disorder: Secondary | ICD-10-CM | POA: Diagnosis not present

## 2015-07-26 DIAGNOSIS — I358 Other nonrheumatic aortic valve disorders: Secondary | ICD-10-CM

## 2015-07-26 DIAGNOSIS — E785 Hyperlipidemia, unspecified: Secondary | ICD-10-CM | POA: Diagnosis not present

## 2015-07-26 NOTE — Progress Notes (Signed)
Cardiology Office Note   Date:  07/26/2015   ID:  [name], DOB 21-Mar-1936, MRN [id number]  PCP:  Joycelyn Rua, MD  Cardiologist: Cassell Clement MD  No chief complaint on file.     History of Present Illness: [name] is a 79 y.o. male who presents for a scheduled follow-up visit. This pleasant 79 year old gentleman is seen for a four-month followup office visit. He was seen in the emergency room on 08/02/13 with uncontrolled hypertension and acute anxiety. He had stayed up all night the previous night checking his blood pressure. He has had worsening problems with anxiety and frustration dealing with multiple orthopedic problems including severe knee pain. He has a past history of essential hypertension, symptomatic PVCs, a past history of polymyalgia rheumatica, and a present history of severe rheumatoid arthritis. He is followed closely by his rheumatologist Dr. Dierdre Forth. The patient does not have any history of ischemic heart disease.  His last echocardiogram 01/01/14 showed normal ejection fraction of 60-65% and mild aortic insufficiency. Since we last saw the patient he underwent successful cervical fusion surgery at [facility] by Dr. Edwyna Shell. The long surgery included fusion of C4-5-6 and 7. So far the surgical results appeared to be good. The patient still has residual problems with his right shoulder and with 3 current right carpal tunnel syndrome which will need surgical attention going forward. He anticipates that he will be having a total right shoulder replacement in the near future by Dr. Rennis Chris.  He now states that he may have to have both shoulders replaced.  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 He has not been expressing any chest pain or shortness of breath. His last echocardiogram on 01/01/14  showed mild aortic insufficiency and his echo cardiogram ejection fraction was 60-65%. The patient has adult onset diabetes mellitus. He has not been having any hypoglycemic episodes.   Past Medical History  Diagnosis Date  . Fatigue   . Aortic valve disease     SCLEROSIS WITHOUT STENOSIS  . Hyperlipidemia   . PVC's (premature ventricular contractions)   . Sciatica   . Anemia   . Coronary artery disease CARDOLOGIST- DR Harlie Buening    cardiac catheterization 2005 / EF 50%  . Diabetes mellitus, type 2 (HCC)   . DDD (degenerative disc disease)   . RA (rheumatoid arthritis) (HCC) RHEUMATOLOGIST-  DR Dareen Piano    SEVERE  . Chronic low back pain     CHRONIC NARCOTIC USE  . Systolic murmur   . Anxiety   . Hypertension     dr Patty Sermons  . BPH (benign prostatic hyperplasia)   . H/O steroid therapy     uses for Rheumatoid arthritis    Past Surgical History  Procedure Laterality Date  . Retinal detachment surgery Left 2001  . Decompressive lumbar laminectomy level 3  07/17/2012    Procedure: DECOMPRESSIVE LUMBAR LAMINECTOMY LEVEL 3;  Surgeon: Drucilla Schmidt, MD;  Location: [facility];  Service: Orthopedics;  Laterality: N/A;  L3-4, L4-5 AND L5-S1  . Right hydrocelectomy/ spermatocelectomy  09-11-2001  . Carpal tunnel release Right 09-24-2006  . Open repair left quadriceps tendon  11-17-2007  . Lumbar spine surgery  10-07-2008    DECOMPRESSION   L2  - S1  . Left index / long fingers i & d ,  repair tendon, and pinning  09-18-2010  . Ankle arthrodesis Right 01-11-2011  . Cardiac catheterization  2005    EF 50% /  showed minimal coronary atherosclerosisi   . Hammer toe surgery      RIGHT SECOND TOE  . Transthoracic echocardiogram  06-13-2012  DR Andrea Colglazier    MILD LVH/ LVSF NORMAL/ EF 55-60%/ GRADE I DIASTOLIC DYSFUNCTION/  MILD AV AND MV REGURG/ AORTIC VALVE SCLEROSIS WITHOUT STENOSIS  . Hardware removal Right 10/17/2012    Procedure: HARDWARE REMOVAL;  Surgeon: Drucilla Schmidt, MD;   [facility];  Service: Orthopedics;  Laterality: Right;  REMOVE SCREW RIGHT ANKLE  . Anterior cervical decomp/discectomy fusion N/A 04/16/2013    Procedure: L4 - L5 REVISION DECOMPRESSION AND FUSION 1 LEVEL;  Surgeon: Venita Lick, MD;  Location: [facility];  Service: Orthopedics;  Laterality: N/A;  . Transurethral resection of prostate N/A 06/29/2015    Procedure: TRANSURETHRAL RESECTION OF THE PROSTATE ;  Surgeon: Hildred Laser, MD;  Location: [facility];  Service: Urology;  Laterality: N/A;  . Cystoscopy with litholapaxy N/A 06/29/2015    Procedure: CYSTOSCOPY WITH LITHOLAPAXY;  Surgeon: Hildred Laser, MD;  Location: [facility];  Service: Urology;  Laterality: N/A;  . Holmium laser application N/A 06/29/2015    Procedure: HOLMIUM LASER APPLICATION;  Surgeon: Hildred Laser, MD;  Location: [facility];  Service: Urology;  Laterality: N/A;     Current Outpatient Prescriptions  Medication Sig Dispense Refill  . amLODipine (NORVASC) 10 MG tablet TAKE 1 TABLET (10 MG TOTAL) BY MOUTH EVERY MORNING. 90 tablet 3  . Calcium Citrate-Vitamin D (CITRACAL + D PO) Take 1 tablet by mouth daily.     . Cholecalciferol (VITAMIN D) 2000 UNITS CAPS Take 6,000 Units by mouth daily.    . cyanocobalamin (,VITAMIN B-12,) 1000 MCG/ML injection INJECT INTO the MUSCLE MONTHLY  6  . DULoxetine (CYMBALTA) 30 MG capsule Take 30 mg by mouth daily.  2  . fentaNYL (DURAGESIC - DOSED MCG/HR) 25 MCG/HR patch Place 25 mcg onto the skin every other day.     . Golimumab (SIMPONI ARIA IV) Inject into the vein. Receives at rheumatologist office    . hydrochlorothiazide (MICROZIDE) 12.5 MG capsule TAKE 1 CAPSULE (12.5 MG TOTAL) BY MOUTH DAILY. 90 capsule 3  . HYDROcodone-acetaminophen (NORCO) 10-325 MG tablet Take 1 tablet by mouth every 4 (four) hours as needed for moderate pain. 30 tablet 0  . leflunomide (ARAVA) 20 MG tablet Take 20 mg by mouth every morning.     Marland Kitchen lisinopril (PRINIVIL,ZESTRIL) 20 MG  tablet Take 60 mg by mouth daily.    Marland Kitchen LYRICA 75 MG capsule Take 75 mg by mouth daily.   0  . metFORMIN (GLUCOPHAGE) 500 MG tablet Take 500 mg by mouth daily with breakfast.    . methocarbamol (ROBAXIN) 500 MG tablet Take 500 mg by mouth 2 (two) times daily as needed for muscle spasms.   2  . metoprolol succinate (TOPROL-XL) 25 MG 24 hr tablet Take 0.5 tablets (12.5 mg total) by mouth daily. 45 tablet 3  . Multiple Vitamin (MULTIVITAMIN WITH MINERALS) TABS Take 1 tablet by mouth daily.     . potassium chloride SA (K-DUR,KLOR-CON) 20 MEQ tablet Take 1 tablet (20 mEq total) by mouth daily. 90 tablet 3  . predniSONE (DELTASONE) 5 MG tablet Take 5 mg by mouth every morning.     . tamsulosin (FLOMAX) 0.4 MG CAPS capsule Take 0.4 mg by mouth daily.     Marland Kitchen  temazepam (RESTORIL) 30 MG capsule Take 30 mg by mouth at bedtime.    . triamcinolone (NASACORT ALLERGY 24HR) 55 MCG/ACT AERO nasal inhaler Place 1 spray into the nose daily as needed (allergies).     No current facility-administered medications for this visit.    Allergies:   Sulfa drugs cross reactors    Social History:  The patient  reports that he has never smoked. He has never used smokeless tobacco. He reports that he does not drink alcohol or use illicit drugs.   Family History:  The patient's family history includes Heart disease in his father. There is no history of Heart attack or Stroke.    ROS:  Please see the history of present illness.   Otherwise, review of systems are positive for none.   All other systems are reviewed and negative.    PHYSICAL EXAM: VS:  BP 118/60 mmHg  Pulse 74  Ht 6\' 3"  (1.905 m)  Wt 179 lb 12.8 oz (81.557 kg)  BMI 22.47 kg/m2 , BMI Body mass index is 22.47 kg/(m^2). GEN: Well nourished, well developed, in no acute distress HEENT: normal Neck: no JVD, carotid bruits, or masses Cardiac: RRR; grade 2/6 systolic ejection murmur at the base. Respiratory:  clear to auscultation bilaterally, normal work of  breathing GI: soft, nontender, nondistended, + BS MS: no deformity or atrophy Skin: warm and dry, no rash Neuro:  Strength and sensation are intact Psych: euthymic mood, full affect   EKG:  EKG is not ordered today.    Recent Labs: 01/15/2015: ALT 18 06/30/2015: BUN 15; Creatinine, Ser 1.02; Hemoglobin 10.0*; Platelets 191; Potassium 3.8; Sodium 139    Lipid Panel    Component Value Date/Time   CHOL 194 01/15/2015 1124   TRIG 336.0* 01/15/2015 1124   HDL 37.60* 01/15/2015 1124   CHOLHDL 5 01/15/2015 1124   VLDL 67.2* 01/15/2015 1124   LDLCALC 96 04/21/2014 1602   LDLDIRECT 92.0 01/15/2015 1124      Wt Readings from Last 3 Encounters:  07/26/15 179 lb 12.8 oz (81.557 kg)  06/29/15 185 lb (83.915 kg)  06/21/15 185 lb (83.915 kg)         ASSESSMENT AND PLAN:  1. aortic valve disease with aortic valve sclerosis and mild aortic insufficiency. 2. essential hypertension without heart failure 3. Generalized anxiety disorder 4. rheumatoid arthritis on chronic immunosuppressant therapy 5. benign PVCs resolved 6.diabetes mellitus type II 7. Status post fusion of C4-5-6 and 7   Current medicines are reviewed at length with the patient today.  The patient does not have concerns regarding medicines.  The following changes have been made:  no change  Labs/ tests ordered today include:  No orders of the defined types were placed in this encounter.     Disposition: The patient will continue current medication.  He is on a more careful diet and his weight is down 15 pounds.  He will return in 4 months for a follow-up office visit and fasting lab work with nurse practitioner/PA  Signed, 13/07/16 MD 07/26/2015 5:24 PM    [facility] [address] [place], [place], [place]  [place] Phone: [phone]; Fax: [phone]

## 2015-07-26 NOTE — Patient Instructions (Signed)
Medication Instructions:  Your physician recommends that you continue on your current medications as directed. Please refer to the Current Medication list given to you today.  Labwork: none  Testing/Procedures: none  Follow-Up: Your physician recommends that you schedule a follow-up appointment in: 4 months with fasting labs (lp/bmet/hfp) with Lori G NP or Scott W PA   If you need a refill on your cardiac medications before your next appointment, please call your pharmacy.  

## 2015-07-27 ENCOUNTER — Other Ambulatory Visit: Payer: Self-pay | Admitting: *Deleted

## 2015-07-27 ENCOUNTER — Telehealth: Payer: Self-pay | Admitting: Cardiology

## 2015-07-27 MED ORDER — METOPROLOL SUCCINATE ER 25 MG PO TB24: 12.5000 mg | ORAL_TABLET | Freq: Every day | ORAL | Status: DC

## 2015-07-27 NOTE — Telephone Encounter (Signed)
Requested rx sent in.

## 2015-07-27 NOTE — Telephone Encounter (Signed)
°  New Prob    *STAT* If patient is at the pharmacy, call can be transferred to refill team.   1. Which medications need to be refilled? (please list name of each medication and dose if known) Metoprolol 25 mg  2. Which pharmacy/location (including street and city if local pharmacy) is medication to be sent to? [facility]   3. Do they need a 30 day or 90 day supply? 90 day

## 2015-08-15 ENCOUNTER — Other Ambulatory Visit: Payer: Self-pay | Admitting: Cardiology

## 2015-09-09 ENCOUNTER — Telehealth: Payer: Self-pay | Admitting: Cardiology

## 2015-09-09 MED ORDER — LISINOPRIL 20 MG PO TABS: 60.0000 mg | ORAL_TABLET | Freq: Every day | ORAL | Status: DC

## 2015-09-09 NOTE — Telephone Encounter (Signed)
°*  STAT* If patient is at the pharmacy, call can be transferred to refill team.   1. Which medications need to be refilled? (please list name of each medication and dose if known) Lisinopril 20mg    2. Which pharmacy/location (including street and city if local pharmacy) is medication to be sent to?[facility]  3. Do they need a 30 day or 90 day supply? 90

## 2015-09-09 NOTE — Telephone Encounter (Signed)
Pt's Rx was sent to pt's pharmacy as requested. Confirmation received.  °

## 2015-09-23 ENCOUNTER — Other Ambulatory Visit: Payer: Self-pay | Admitting: Cardiology

## 2015-09-23 DIAGNOSIS — G4700 Insomnia, unspecified: Secondary | ICD-10-CM

## 2015-09-23 MED ORDER — TEMAZEPAM 30 MG PO CAPS: 30.0000 mg | ORAL_CAPSULE | Freq: Every evening | ORAL | Status: AC | PRN

## 2015-09-23 NOTE — Telephone Encounter (Signed)
New message      *STAT* If patient is at the pharmacy, call can be transferred to refill team.   1. Which medications need to be refilled? (please list name of each medication and dose if known)  Temazepam 30mg   2. Which pharmacy/location (including street and city if local pharmacy) is medication to be sent to? [email or web address]  3. Do they need a 30 day or 90 day supply? 90 day supply

## 2015-09-23 NOTE — Telephone Encounter (Signed)
Called as requested and advised patient further refills from PCP

## 2015-10-04 ENCOUNTER — Ambulatory Visit (HOSPITAL_COMMUNITY)
Admission: RE | Admit: 2015-10-04 | Discharge: 2015-10-04 | Disposition: A | Discharge status: Home / Self Care | Payer: Medicare Other | Source: Ambulatory Visit | Attending: Orthopedic Surgery | Admitting: Orthopedic Surgery

## 2015-10-04 ENCOUNTER — Other Ambulatory Visit: Payer: Self-pay | Admitting: Orthopedic Surgery

## 2015-10-04 DIAGNOSIS — Z1389 Encounter for screening for other disorder: Secondary | ICD-10-CM | POA: Insufficient documentation

## 2015-10-04 DIAGNOSIS — Z01818 Encounter for other preprocedural examination: Secondary | ICD-10-CM

## 2015-10-08 ENCOUNTER — Ambulatory Visit: Payer: Medicare Other | Admitting: Surgery

## 2015-10-21 ENCOUNTER — Encounter (HOSPITAL_BASED_OUTPATIENT_CLINIC_OR_DEPARTMENT_OTHER): Payer: Medicare Other

## 2015-10-22 ENCOUNTER — Other Ambulatory Visit: Payer: Self-pay | Admitting: Orthopedic Surgery

## 2015-10-27 ENCOUNTER — Encounter (HOSPITAL_COMMUNITY): Payer: Self-pay | Admitting: *Deleted

## 2015-10-27 MED ORDER — SODIUM CHLORIDE 0.9 % IV SOLN: INTRAVENOUS | Status: DC

## 2015-10-27 NOTE — Progress Notes (Signed)
Pt denies any cardiac history except for a "slight" heart murmur, chest pain or sob. He does see Dr. Patty Sermons and last office visit was 07/26/15. Pt is diabetic, states that his fasting blood sugar run in low 100's.

## 2015-10-28 ENCOUNTER — Encounter (HOSPITAL_COMMUNITY): Payer: Self-pay | Admitting: *Deleted

## 2015-10-28 ENCOUNTER — Ambulatory Visit (HOSPITAL_COMMUNITY): Payer: Medicare Other | Admitting: Certified Registered Nurse Anesthetist

## 2015-10-28 ENCOUNTER — Ambulatory Visit (HOSPITAL_COMMUNITY)
Admission: RE | Admit: 2015-10-28 | Discharge: 2015-10-28 | Disposition: A | Discharge status: Home / Self Care | Payer: Medicare Other | Source: Ambulatory Visit | Attending: Orthopedic Surgery | Admitting: Orthopedic Surgery

## 2015-10-28 ENCOUNTER — Encounter (HOSPITAL_COMMUNITY)
Admission: RE | Disposition: A | Discharge status: Home / Self Care | Payer: Self-pay | Source: Ambulatory Visit | Attending: Orthopedic Surgery

## 2015-10-28 DIAGNOSIS — Z7984 Long term (current) use of oral hypoglycemic drugs: Secondary | ICD-10-CM | POA: Diagnosis not present

## 2015-10-28 DIAGNOSIS — L97519 Non-pressure chronic ulcer of other part of right foot with unspecified severity: Secondary | ICD-10-CM | POA: Insufficient documentation

## 2015-10-28 DIAGNOSIS — G8929 Other chronic pain: Secondary | ICD-10-CM | POA: Insufficient documentation

## 2015-10-28 DIAGNOSIS — E11621 Type 2 diabetes mellitus with foot ulcer: Secondary | ICD-10-CM | POA: Diagnosis not present

## 2015-10-28 DIAGNOSIS — E1169 Type 2 diabetes mellitus with other specified complication: Secondary | ICD-10-CM | POA: Diagnosis not present

## 2015-10-28 DIAGNOSIS — M869 Osteomyelitis, unspecified: Secondary | ICD-10-CM | POA: Diagnosis not present

## 2015-10-28 DIAGNOSIS — E785 Hyperlipidemia, unspecified: Secondary | ICD-10-CM | POA: Insufficient documentation

## 2015-10-28 DIAGNOSIS — Z79899 Other long term (current) drug therapy: Secondary | ICD-10-CM | POA: Insufficient documentation

## 2015-10-28 DIAGNOSIS — N400 Enlarged prostate without lower urinary tract symptoms: Secondary | ICD-10-CM | POA: Diagnosis not present

## 2015-10-28 DIAGNOSIS — I10 Essential (primary) hypertension: Secondary | ICD-10-CM | POA: Diagnosis not present

## 2015-10-28 DIAGNOSIS — L02611 Cutaneous abscess of right foot: Secondary | ICD-10-CM

## 2015-10-28 DIAGNOSIS — I2510 Atherosclerotic heart disease of native coronary artery without angina pectoris: Secondary | ICD-10-CM | POA: Insufficient documentation

## 2015-10-28 DIAGNOSIS — Z7952 Long term (current) use of systemic steroids: Secondary | ICD-10-CM | POA: Diagnosis not present

## 2015-10-28 DIAGNOSIS — M069 Rheumatoid arthritis, unspecified: Secondary | ICD-10-CM | POA: Diagnosis not present

## 2015-10-28 DIAGNOSIS — M545 Low back pain: Secondary | ICD-10-CM | POA: Diagnosis not present

## 2015-10-28 HISTORY — PX: I & D EXTREMITY: SHX5045

## 2015-10-28 HISTORY — DX: Calculus in bladder: N21.0

## 2015-10-28 LAB — CBC
HCT: 31.8 % — ABNORMAL LOW (ref 39.0–52.0)
Hemoglobin: 10.7 g/dL — ABNORMAL LOW (ref 13.0–17.0)
MCH: 28.8 pg (ref 26.0–34.0)
MCHC: 33.6 g/dL (ref 30.0–36.0)
MCV: 85.7 fL (ref 78.0–100.0)
PLATELETS: 322 10*3/uL (ref 150–400)
RBC: 3.71 MIL/uL — AB (ref 4.22–5.81)
RDW: 13.4 % (ref 11.5–15.5)
WBC: 7.8 10*3/uL (ref 4.0–10.5)

## 2015-10-28 LAB — BASIC METABOLIC PANEL
Anion gap: 17 — ABNORMAL HIGH (ref 5–15)
BUN: 38 mg/dL — AB (ref 6–20)
CALCIUM: 9.4 mg/dL (ref 8.9–10.3)
CO2: 24 mmol/L (ref 22–32)
CREATININE: 1.56 mg/dL — AB (ref 0.61–1.24)
Chloride: 99 mmol/L — ABNORMAL LOW (ref 101–111)
GFR calc non Af Amer: 41 mL/min — ABNORMAL LOW (ref >60)
GFR, EST AFRICAN AMERICAN: 48 mL/min — AB (ref >60)
Glucose, Bld: 224 mg/dL — ABNORMAL HIGH (ref 65–99)
Potassium: 4.6 mmol/L (ref 3.5–5.1)
Sodium: 140 mmol/L (ref 135–145)

## 2015-10-28 LAB — GLUCOSE, CAPILLARY
GLUCOSE-CAPILLARY: 204 mg/dL — AB (ref 65–99)
GLUCOSE-CAPILLARY: 215 mg/dL — AB (ref 65–99)

## 2015-10-28 SURGERY — IRRIGATION AND DEBRIDEMENT EXTREMITY
Anesthesia: General | Site: Foot | Laterality: Right

## 2015-10-28 MED ORDER — VANCOMYCIN HCL IN DEXTROSE 1-5 GM/200ML-% IV SOLN
1000.0000 mg | INTRAVENOUS | Status: AC
  Administered 2015-10-28: 1000 mg via INTRAVENOUS
  Filled 2015-10-28: qty 200

## 2015-10-28 MED ORDER — GLYCOPYRROLATE 0.2 MG/ML IJ SOLN
INTRAMUSCULAR | Status: DC | PRN
  Administered 2015-10-28 (×2): .2 mg via INTRAVENOUS

## 2015-10-28 MED ORDER — HYDROMORPHONE HCL 1 MG/ML IJ SOLN
0.2500 mg | INTRAMUSCULAR | Status: DC | PRN
  Administered 2015-10-28 (×4): 0.5 mg via INTRAVENOUS

## 2015-10-28 MED ORDER — EPHEDRINE SULFATE 50 MG/ML IJ SOLN
INTRAMUSCULAR | Status: DC | PRN
  Administered 2015-10-28 (×2): 15 mg via INTRAVENOUS

## 2015-10-28 MED ORDER — HYDROMORPHONE HCL 1 MG/ML IJ SOLN
INTRAMUSCULAR | Status: AC
  Administered 2015-10-28: 0.5 mg via INTRAVENOUS
  Filled 2015-10-28: qty 1

## 2015-10-28 MED ORDER — DOXYCYCLINE HYCLATE 50 MG PO CAPS
100.0000 mg | ORAL_CAPSULE | Freq: Two times a day (BID) | ORAL | Status: DC
Start: 2015-10-28 — End: 2016-06-01

## 2015-10-28 MED ORDER — FENTANYL CITRATE (PF) 250 MCG/5ML IJ SOLN
INTRAMUSCULAR | Status: AC
  Filled 2015-10-28: qty 5

## 2015-10-28 MED ORDER — VANCOMYCIN HCL 1000 MG IV SOLR
INTRAVENOUS | Status: DC | PRN
  Administered 2015-10-28: 1000 mg

## 2015-10-28 MED ORDER — 0.9 % SODIUM CHLORIDE (POUR BTL) OPTIME
TOPICAL | Status: DC | PRN
  Administered 2015-10-28: 1000 mL

## 2015-10-28 MED ORDER — LIDOCAINE HCL (CARDIAC) 20 MG/ML IV SOLN
INTRAVENOUS | Status: AC
  Filled 2015-10-28: qty 5

## 2015-10-28 MED ORDER — ONDANSETRON HCL 4 MG/2ML IJ SOLN
INTRAMUSCULAR | Status: AC
  Filled 2015-10-28: qty 2

## 2015-10-28 MED ORDER — PROMETHAZINE HCL 25 MG/ML IJ SOLN: 6.2500 mg | INTRAMUSCULAR | Status: DC | PRN

## 2015-10-28 MED ORDER — DEXAMETHASONE SODIUM PHOSPHATE 4 MG/ML IJ SOLN
INTRAMUSCULAR | Status: DC | PRN
  Administered 2015-10-28: 4 mg via INTRAVENOUS

## 2015-10-28 MED ORDER — SENNA 8.6 MG PO TABS: 2.0000 | ORAL_TABLET | Freq: Two times a day (BID) | ORAL | Status: DC

## 2015-10-28 MED ORDER — FENTANYL CITRATE (PF) 100 MCG/2ML IJ SOLN
INTRAMUSCULAR | Status: AC
  Filled 2015-10-28: qty 2

## 2015-10-28 MED ORDER — HYDROMORPHONE HCL 1 MG/ML IJ SOLN
INTRAMUSCULAR | Status: AC
  Filled 2015-10-28: qty 1

## 2015-10-28 MED ORDER — MIDAZOLAM HCL 2 MG/2ML IJ SOLN
0.5000 mg | Freq: Once | INTRAMUSCULAR | Status: AC | PRN
  Administered 2015-10-28: 1 mg via INTRAVENOUS

## 2015-10-28 MED ORDER — MIDAZOLAM HCL 2 MG/2ML IJ SOLN
INTRAMUSCULAR | Status: AC
  Administered 2015-10-28: 1 mg via INTRAVENOUS
  Filled 2015-10-28: qty 2

## 2015-10-28 MED ORDER — LACTATED RINGERS IV SOLN
INTRAVENOUS | Status: DC
  Administered 2015-10-28: 09:00:00 via INTRAVENOUS

## 2015-10-28 MED ORDER — PROPOFOL 10 MG/ML IV BOLUS
INTRAVENOUS | Status: AC
  Filled 2015-10-28: qty 20

## 2015-10-28 MED ORDER — DEXAMETHASONE SODIUM PHOSPHATE 4 MG/ML IJ SOLN
INTRAMUSCULAR | Status: AC
  Filled 2015-10-28: qty 1

## 2015-10-28 MED ORDER — BACITRACIN ZINC 500 UNIT/GM EX OINT
TOPICAL_OINTMENT | CUTANEOUS | Status: AC
  Filled 2015-10-28: qty 28.35

## 2015-10-28 MED ORDER — DOCUSATE SODIUM 100 MG PO CAPS: 100.0000 mg | ORAL_CAPSULE | Freq: Two times a day (BID) | ORAL | Status: DC

## 2015-10-28 MED ORDER — PROPOFOL 10 MG/ML IV BOLUS
INTRAVENOUS | Status: DC | PRN
  Administered 2015-10-28: 150 mg via INTRAVENOUS
  Administered 2015-10-28: 50 mg via INTRAVENOUS

## 2015-10-28 MED ORDER — MEPERIDINE HCL 25 MG/ML IJ SOLN: 6.2500 mg | INTRAMUSCULAR | Status: DC | PRN

## 2015-10-28 MED ORDER — FENTANYL CITRATE (PF) 100 MCG/2ML IJ SOLN
INTRAMUSCULAR | Status: DC | PRN
  Administered 2015-10-28 (×3): 50 ug via INTRAVENOUS
  Administered 2015-10-28: 100 ug via INTRAVENOUS

## 2015-10-28 MED ORDER — DEXAMETHASONE SODIUM PHOSPHATE 10 MG/ML IJ SOLN
INTRAMUSCULAR | Status: AC
  Filled 2015-10-28: qty 1

## 2015-10-28 MED ORDER — GENTAMICIN SULFATE 40 MG/ML IJ SOLN
INTRAMUSCULAR | Status: AC
  Filled 2015-10-28: qty 6

## 2015-10-28 MED ORDER — SODIUM CHLORIDE 0.9 % IR SOLN
Status: DC | PRN
  Administered 2015-10-28: 3000 mL

## 2015-10-28 MED ORDER — LIDOCAINE HCL (CARDIAC) 20 MG/ML IV SOLN
INTRAVENOUS | Status: DC | PRN
  Administered 2015-10-28: 30 mg via INTRAVENOUS

## 2015-10-28 MED ORDER — MIDAZOLAM HCL 2 MG/2ML IJ SOLN
INTRAMUSCULAR | Status: AC
  Filled 2015-10-28: qty 2

## 2015-10-28 MED ORDER — ONDANSETRON HCL 4 MG/2ML IJ SOLN
INTRAMUSCULAR | Status: DC | PRN
  Administered 2015-10-28: 4 mg via INTRAVENOUS

## 2015-10-28 MED ORDER — CHLORHEXIDINE GLUCONATE 4 % EX LIQD: 60.0000 mL | Freq: Once | CUTANEOUS | Status: DC

## 2015-10-28 SURGICAL SUPPLY — 48 items
BANDAGE ELASTIC 4 VELCRO ST LF (GAUZE/BANDAGES/DRESSINGS) ×3 IMPLANT
BANDAGE ESMARK 6X9 LF (GAUZE/BANDAGES/DRESSINGS) ×1 IMPLANT
BLADE SURG 10 STRL SS (BLADE) ×3 IMPLANT
BNDG CMPR 9X6 STRL LF SNTH (GAUZE/BANDAGES/DRESSINGS) ×2
BNDG COHESIVE 4X5 TAN STRL (GAUZE/BANDAGES/DRESSINGS) ×3 IMPLANT
BNDG COHESIVE 6X5 TAN STRL LF (GAUZE/BANDAGES/DRESSINGS) ×3 IMPLANT
BNDG CONFORM 3 STRL LF (GAUZE/BANDAGES/DRESSINGS) ×3 IMPLANT
BNDG ESMARK 6X9 LF (GAUZE/BANDAGES/DRESSINGS) ×6
CANISTER SUCT 3000ML PPV (MISCELLANEOUS) ×3 IMPLANT
CHLORAPREP W/TINT 26ML (MISCELLANEOUS) ×3 IMPLANT
CONT SPEC 4OZ CLIKSEAL STRL BL (MISCELLANEOUS) ×2 IMPLANT
COVER SURGICAL LIGHT HANDLE (MISCELLANEOUS) ×3 IMPLANT
DRAPE U-SHAPE 47X51 STRL (DRAPES) ×3 IMPLANT
DRSG ADAPTIC 3X8 NADH LF (GAUZE/BANDAGES/DRESSINGS) ×3 IMPLANT
DRSG PAD ABDOMINAL 8X10 ST (GAUZE/BANDAGES/DRESSINGS) ×2 IMPLANT
ELECT CAUTERY BLADE 6.4 (BLADE) ×2 IMPLANT
ELECT REM PT RETURN 9FT ADLT (ELECTROSURGICAL) ×3
ELECTRODE REM PT RTRN 9FT ADLT (ELECTROSURGICAL) ×1 IMPLANT
GAUZE SPONGE 4X4 12PLY STRL (GAUZE/BANDAGES/DRESSINGS) ×2 IMPLANT
GLOVE BIO SURGEON STRL SZ 6.5 (GLOVE) ×1 IMPLANT
GLOVE BIO SURGEON STRL SZ8 (GLOVE) ×3 IMPLANT
GLOVE BIO SURGEONS STRL SZ 6.5 (GLOVE) ×1
GLOVE BIOGEL PI IND STRL 8 (GLOVE) ×2 IMPLANT
GLOVE BIOGEL PI INDICATOR 8 (GLOVE) ×4
GLOVE ECLIPSE 7.5 STRL STRAW (GLOVE) ×3 IMPLANT
GOWN STRL REUS W/ TWL XL LVL3 (GOWN DISPOSABLE) ×2 IMPLANT
GOWN STRL REUS W/TWL XL LVL3 (GOWN DISPOSABLE) ×6
KIT BASIN OR (CUSTOM PROCEDURE TRAY) ×3 IMPLANT
KIT ROOM TURNOVER OR (KITS) ×3 IMPLANT
NS IRRIG 1000ML POUR BTL (IV SOLUTION) ×3 IMPLANT
PACK ORTHO EXTREMITY (CUSTOM PROCEDURE TRAY) ×3 IMPLANT
PAD ARMBOARD 7.5X6 YLW CONV (MISCELLANEOUS) ×6 IMPLANT
PAD CAST 4YDX4 CTTN HI CHSV (CAST SUPPLIES) ×1 IMPLANT
PADDING CAST COTTON 4X4 STRL (CAST SUPPLIES) ×3
SET CYSTO W/LG BORE CLAMP LF (SET/KITS/TRAYS/PACK) ×3 IMPLANT
SPONGE LAP 4X18 X RAY DECT (DISPOSABLE) ×3 IMPLANT
SUCTION FRAZIER HANDLE 10FR (MISCELLANEOUS) ×2
SUCTION TUBE FRAZIER 10FR DISP (MISCELLANEOUS) ×1 IMPLANT
SUT ETHILON 2 0 FS 18 (SUTURE) ×4 IMPLANT
SUT VIC AB 3-0 PS2 18 (SUTURE) ×3
SUT VIC AB 3-0 PS2 18XBRD (SUTURE) ×1 IMPLANT
TOWEL OR 17X24 6PK STRL BLUE (TOWEL DISPOSABLE) ×3 IMPLANT
TOWEL OR 17X26 10 PK STRL BLUE (TOWEL DISPOSABLE) ×3 IMPLANT
TUBE CONNECTING 12'X1/4 (SUCTIONS) ×1
TUBE CONNECTING 12X1/4 (SUCTIONS) ×2 IMPLANT
TUBING CYSTO DISP (UROLOGICAL SUPPLIES) ×3 IMPLANT
WATER STERILE IRR 1000ML POUR (IV SOLUTION) ×3 IMPLANT
YANKAUER SUCT BULB TIP NO VENT (SUCTIONS) ×3 IMPLANT

## 2015-10-28 NOTE — H&P (Signed)
[name] is an 80 y.o. male.   Chief Complaint: chronic right foot diabetic ulcer HPI:  80 y/o male with chronic ulcer at the 5th MT base.  He has MRI findings of phlegmon and osteomyelitis of the 5th MT base.  He presents today for irrigation and debridement of the skin, subcutaneous tissue and bone.  He has been off of abx for more than a week.  Past Medical History  Diagnosis Date  . Fatigue   . Aortic valve disease     SCLEROSIS WITHOUT STENOSIS  . Hyperlipidemia   . PVC's (premature ventricular contractions)   . Sciatica   . Anemia   . Diabetes mellitus, type 2 (HCC)   . DDD (degenerative disc disease)   . RA (rheumatoid arthritis) (HCC) RHEUMATOLOGIST-  DR Dareen Piano    SEVERE  . Chronic low back pain     CHRONIC NARCOTIC USE  . Systolic murmur   . Anxiety   . Hypertension     dr Patty Sermons  . BPH (benign prostatic hyperplasia)   . H/O steroid therapy     uses for Rheumatoid arthritis  . Coronary artery disease CARDOLOGIST- DR BRACKBILL    cardiac catheterization 2005 / EF 50% - Pt not aware of this  . Bladder stone     Past Surgical History  Procedure Laterality Date  . Retinal detachment surgery Left 2001  . Decompressive lumbar laminectomy level 3  07/17/2012    Procedure: DECOMPRESSIVE LUMBAR LAMINECTOMY LEVEL 3;  Surgeon: Drucilla Schmidt, MD;  Location: [facility];  Service: Orthopedics;  Laterality: N/A;  L3-4, L4-5 AND L5-S1  . Right hydrocelectomy/ spermatocelectomy  09-11-2001  . Carpal tunnel release Right 09-24-2006  . Open repair left quadriceps tendon  11-17-2007  . Lumbar spine surgery  10-07-2008    DECOMPRESSION   L2  - S1  . Left index / long fingers i & d , repair tendon, and pinning  09-18-2010  . Ankle arthrodesis Right 01-11-2011  . Cardiac catheterization  2005    EF 50% /  showed minimal coronary atherosclerosisi   . Hammer toe surgery      RIGHT SECOND TOE  . Transthoracic echocardiogram  06-13-2012  DR BRACKBILL    MILD LVH/ LVSF NORMAL/  EF 55-60%/ GRADE I DIASTOLIC DYSFUNCTION/  MILD AV AND MV REGURG/ AORTIC VALVE SCLEROSIS WITHOUT STENOSIS  . Hardware removal Right 10/17/2012    Procedure: HARDWARE REMOVAL;  Surgeon: Drucilla Schmidt, MD;  Location: [facility];  Service: Orthopedics;  Laterality: Right;  REMOVE SCREW RIGHT ANKLE  . Anterior cervical decomp/discectomy fusion N/A 04/16/2013    Procedure: L4 - L5 REVISION DECOMPRESSION AND FUSION 1 LEVEL;  Surgeon: Venita Lick, MD;  Location: [facility];  Service: Orthopedics;  Laterality: N/A;  . Transurethral resection of prostate N/A 06/29/2015    Procedure: TRANSURETHRAL RESECTION OF THE PROSTATE ;  Surgeon: Hildred Laser, MD;  Location: [facility];  Service: Urology;  Laterality: N/A;  . Cystoscopy with litholapaxy N/A 06/29/2015    Procedure: CYSTOSCOPY WITH LITHOLAPAXY;  Surgeon: Hildred Laser, MD;  Location: [facility];  Service: Urology;  Laterality: N/A;  . Holmium laser application N/A 06/29/2015    Procedure: HOLMIUM LASER APPLICATION;  Surgeon: Hildred Laser, MD;  Location: [facility];  Service: Urology;  Laterality: N/A;    Family History  Problem Relation Age of Onset  . Heart disease Father   . Heart attack Neg Hx   . Stroke Neg Hx  Social History:  reports that he has never smoked. He has never used smokeless tobacco. He reports that he does not drink alcohol or use illicit drugs.  Allergies:  Allergies  Allergen Reactions  . Sulfa Drugs Cross Reactors Other (See Comments)    Kidney damage [as a child].  Older formulations may have contributed. (Pt stated he may or may not be allergic to this medication)     Medications Prior to Admission  Medication Sig Dispense Refill  . amLODipine (NORVASC) 10 MG tablet TAKE 1 TABLET (10 MG TOTAL) BY MOUTH EVERY MORNING. 90 tablet 3  . Calcium Citrate-Vitamin D (CITRACAL + D PO) Take 1 tablet by mouth daily.     . Cholecalciferol (VITAMIN D) 2000 UNITS CAPS Take 6,000 Units by mouth daily.    .  cyanocobalamin (,VITAMIN B-12,) 1000 MCG/ML injection INJECT INTO the MUSCLE MONTHLY  6  . fentaNYL (DURAGESIC - DOSED MCG/HR) 25 MCG/HR patch Place 25 mcg onto the skin every other day.     . hydrochlorothiazide (MICROZIDE) 12.5 MG capsule TAKE 1 CAPSULE (12.5 MG TOTAL) BY MOUTH DAILY. 90 capsule 3  . HYDROcodone-acetaminophen (NORCO) 10-325 MG tablet Take 1 tablet by mouth every 4 (four) hours as needed for moderate pain. 30 tablet 0  . leflunomide (ARAVA) 20 MG tablet Take 20 mg by mouth every morning.     Marland Kitchen lisinopril (PRINIVIL,ZESTRIL) 20 MG tablet Take 3 tablets (60 mg total) by mouth daily. 270 tablet 3  . LYRICA 75 MG capsule Take 75 mg by mouth daily.   0  . metFORMIN (GLUCOPHAGE) 500 MG tablet Take 500 mg by mouth daily with breakfast.    . methocarbamol (ROBAXIN) 500 MG tablet Take 500 mg by mouth 2 (two) times daily as needed for muscle spasms.   2  . metoprolol succinate (TOPROL-XL) 25 MG 24 hr tablet Take 0.5 tablets (12.5 mg total) by mouth daily. 45 tablet 3  . Multiple Vitamin (MULTIVITAMIN WITH MINERALS) TABS Take 1 tablet by mouth daily.     . potassium chloride SA (K-DUR,KLOR-CON) 20 MEQ tablet Take 1 tablet (20 mEq total) by mouth daily. 90 tablet 3  . predniSONE (DELTASONE) 5 MG tablet Take 5 mg by mouth every morning.     . temazepam (RESTORIL) 30 MG capsule Take 1 capsule (30 mg total) by mouth at bedtime as needed for sleep. 90 capsule 0  . triamcinolone (NASACORT ALLERGY 24HR) 55 MCG/ACT AERO nasal inhaler Place 1 spray into the nose daily as needed (allergies).      Results for orders placed or performed during the hospital encounter of 10/28/15 (from the past 48 hour(s))  Glucose, capillary     Status: Abnormal   Collection Time: 10/28/15  8:13 AM  Result Value Ref Range   Glucose-Capillary 204 (H) 65 - 99 mg/dL   Comment 1 Notify RN    Comment 2 Document in Chart   CBC     Status: Abnormal   Collection Time: 10/28/15  8:26 AM  Result Value Ref Range   WBC  7.8 4.0 - 10.5 K/uL   RBC 3.71 (L) 4.22 - 5.81 MIL/uL   Hemoglobin 10.7 (L) 13.0 - 17.0 g/dL   HCT 38.4 (L) 53.6 - 46.8 %   MCV 85.7 78.0 - 100.0 fL   MCH 28.8 26.0 - 34.0 pg   MCHC 33.6 30.0 - 36.0 g/dL   RDW 03.2 12.2 - 48.2 %   Platelets 322 150 - 400 K/uL   No  results found.  ROS  No recent f/c/n/v/wt loss  Blood pressure 122/66, pulse 51, temperature 98 F (36.7 C), temperature source Oral, resp. rate 18, height 6\' 3"  (1.905 m), weight 79.379 kg (175 lb), SpO2 98 %. Physical Exam  wn wd male in nad.  A and O x 4.  Mood and affect normal.  EOMI.  resp unlabored.  R foot with ulcer at lateral base.  No lymphadenopathy.  5/5 strength in PF and DF.  Skin o/w healthy and intact.  Diminished sens to LT at the foot.  Assessment/Plan R foot diabetic ulcer and chronic osteo of the 5th MT base.  To OR for I and D.  The risks and benefits of the alternative treatment options have been discussed in detail.  The patient wishes to proceed with surgery and specifically understands risks of bleeding, infection, nerve damage, blood clots, need for additional surgery, amputation and death.   , MD November 14, 2015, 9:21 AM

## 2015-10-28 NOTE — Anesthesia Postprocedure Evaluation (Signed)
Anesthesia Post Note  Patient: [name]  Procedure(s) Performed: Procedure(s) (LRB): IRRIGATION AND DEBRIDEMENT OF RIGHT FOOT AND EXOSTECTOMY  (Right)  Patient location during evaluation: PACU Anesthesia Type: General Level of consciousness: awake and alert, oriented and patient cooperative Pain management: pain level controlled Vital Signs Assessment: post-procedure vital signs reviewed and stable Respiratory status: spontaneous breathing, nonlabored ventilation and respiratory function stable Cardiovascular status: blood pressure returned to baseline and stable Postop Assessment: no signs of nausea or vomiting Anesthetic complications: no    Last Vitals:  Filed Vitals:   10/28/15 0808 10/28/15 1052  BP: 122/66 116/64  Pulse: 51 72  Temp: 36.7 C 36.9 C  Resp: 18 19    Last Pain:  Filed Vitals:   10/28/15 1100  PainSc: 8                  Manan Olmo,E. Lawayne Hartig

## 2015-10-28 NOTE — Brief Op Note (Addendum)
10/28/2015  10:39 AM  PATIENT:  [name]  80 y.o. male  PRE-OPERATIVE DIAGNOSIS:  RIGHT FOOT OSTEOMYELITIS AND ABSCESS   POST-OPERATIVE DIAGNOSIS:  RIGHT FOOT OSTEOMYELITIS AND ABSCESS   Procedure(s):  1.  Irrigation and debridement of right foot abscess   2.  Exostectomy and debridement of right 5th MT base    SURGEON:  Toni Arthurs, MD  ASSISTANT:  Alfredo Martinez, PA-C  ANESTHESIA:   General  EBL:  minimal   TOURNIQUET:  approx 15 min with ankle esmarch  COMPLICATIONS:  None apparent  DISPOSITION:  Extubated, awake and stable to recovery.  DICTATION ID:

## 2015-10-28 NOTE — Anesthesia Procedure Notes (Signed)
Procedure Name: LMA Insertion Date/Time: 10/28/2015 9:59 AM Performed by: Faustino Congress Hasaan Radde Pre-anesthesia Checklist: Patient identified, Emergency Drugs available, Suction available and Patient being monitored Patient Re-evaluated:Patient Re-evaluated prior to inductionOxygen Delivery Method: Circle system utilized Preoxygenation: Pre-oxygenation with 100% oxygen Intubation Type: IV induction LMA: LMA inserted LMA Size: 4.0 Number of attempts: 1 Placement Confirmation: positive ETCO2 and breath sounds checked- equal and bilateral Tube secured with: Tape Dental Injury: Teeth and Oropharynx as per pre-operative assessment

## 2015-10-28 NOTE — Transfer of Care (Signed)
Immediate Anesthesia Transfer of Care Note  Patient: [name]  Procedure(s) Performed: Procedure(s): IRRIGATION AND DEBRIDEMENT OF RIGHT FOOT AND EXOSTECTOMY  (Right)  Patient Location: PACU  Anesthesia Type:General  Level of Consciousness: awake, alert , oriented and patient cooperative  Airway & Oxygen Therapy: Patient Spontanous Breathing and Patient connected to nasal cannula oxygen  Post-op Assessment: Report given to RN and Post -op Vital signs reviewed and stable  Post vital signs: Reviewed and stable  Last Vitals:  Filed Vitals:   10/28/15 0808  BP: 122/66  Pulse: 51  Temp: 36.7 C  Resp: 18    Complications: No apparent anesthesia complications

## 2015-10-28 NOTE — Anesthesia Preprocedure Evaluation (Addendum)
Anesthesia Evaluation  Patient identified by MRN, date of birth, ID band Patient awake    Reviewed: Allergy & Precautions, NPO status , Patient's Chart, lab work & pertinent test results, reviewed documented beta blocker date and time   History of Anesthesia Complications Negative for: history of anesthetic complications  Airway Mallampati: II  TM Distance: >3 FB Neck ROM: Full    Dental  (+) Dental Advisory Given   Pulmonary neg pulmonary ROS,    breath sounds clear to auscultation       Cardiovascular hypertension, Pt. on medications and Pt. on home beta blockers (-) angina Rhythm:Regular Rate:Normal  '15 ECHO: EF 60-65%, mild AS, trivial AI   Neuro/Psych Anxiety Chronic back pain: narcotics    GI/Hepatic Neg liver ROS, GERD  Controlled,  Endo/Other  diabetes (glu 204), Oral Hypoglycemic Agents  Renal/GU negative Renal ROS     Musculoskeletal  (+) Arthritis , Rheumatoid disorders and steroids,    Abdominal   Peds  Hematology  (+) Blood dyscrasia (Hb 10.7), ,   Anesthesia Other Findings   Reproductive/Obstetrics                          Anesthesia Physical Anesthesia Plan  ASA: III  Anesthesia Plan: General   Post-op Pain Management:    Induction: Intravenous  Airway Management Planned: LMA  Additional Equipment:   Intra-op Plan:   Post-operative Plan:   Informed Consent: I have reviewed the patients History and Physical, chart, labs and discussed the procedure including the risks, benefits and alternatives for the proposed anesthesia with the patient or authorized representative who has indicated his/her understanding and acceptance.   Dental advisory given  Plan Discussed with: CRNA and Surgeon  Anesthesia Plan Comments: (Plan routine monitors, GA- LMA OK)        Anesthesia Quick Evaluation

## 2015-10-28 NOTE — Progress Notes (Signed)
Orthopedic Tech Progress Note Patient Details:  [name] 1936-01-23 [id number]  Ortho Devices Type of Ortho Device: Postop shoe/boot Ortho Device/Splint Interventions: Application   Saul Fordyce 10/28/2015, 12:29 PM

## 2015-10-28 NOTE — Discharge Instructions (Signed)
Toni Arthurs, MD [facility]  Please read the following information regarding your care after surgery.  Medications  You only need a prescription for the narcotic pain medicine (ex. oxycodone, Percocet, Norco).  All of the other medicines listed below are available over the counter. X acetominophen (Tylenol) 650 mg every 4-6 hours as you need for minor pain ?   Narcotic pain medicine (ex. oxycodone, Percocet, Vicodin, fentanyl patches) will cause constipation.  To prevent this problem, take the following medicines while you are taking any pain medicine. X docusate sodium (Colace) 100 mg twice a day X senna (Senokot) 2 tablets twice a day    Weight Bearing X Bear weight when you are able on your operated leg or foot in flat post-op shoe.   Cast / Splint / Dressing X Keep your dressing clean and dry.  Dont put anything (coat hanger, pencil, etc) down inside of it.  If it gets damp, use a hair dryer on the cool setting to dry it.  If it gets soaked, call the office to schedule an appointment for a cast change.     After your dressing, cast or splint is removed; you may shower, but do not soak or scrub the wound.  Allow the water to run over it, and then gently pat it dry.  Swelling It is normal for you to have swelling where you had surgery.  To reduce swelling and pain, keep your toes above your nose for at least 3 days after surgery.  It may be necessary to keep your foot or leg elevated for several weeks.  If it hurts, it should be elevated.  Follow Up Call my office at [phone] when you are discharged from the hospital or surgery center to schedule an appointment to be seen two weeks after surgery.  Call my office at [phone] if you develop a fever >101.5 F, nausea, vomiting, bleeding from the surgical site or severe pain.

## 2015-10-28 NOTE — Op Note (Signed)
[name], MCZEAL NO.:  [id number]  MEDICAL RECORD NO.:  [id number]  LOCATION:  [facility]                         FACILITY:  [facility]  PHYSICIAN:  Toni Arthurs, MD        DATE OF BIRTH:  1936/02/18  DATE OF PROCEDURE:  10/28/2015 DATE OF DISCHARGE:                              OPERATIVE REPORT   PREOPERATIVE DIAGNOSES: 1. Chronic right foot diabetic ulcer. 2. Right foot abscess. 3. Right fifth metatarsal base osteomyelitis.  POSTOPERATIVE DIAGNOSES: 1. Chronic right foot diabetic ulcer. 2. Right foot abscess. 3. Right fifth metatarsal base osteomyelitis.  PROCEDURES: 1. Irrigation and debridement of right foot deep abscess including     skin, subcutaneous tissue, and bone. 2. Right fifth metatarsal base exostectomy and debridement.  SURGEON:  Toni Arthurs, MD  ASSISTANT:  Alfredo Martinez, PA-C.  ANESTHESIA:  General.  ESTIMATED BLOOD LOSS:  Minimal.  TOURNIQUET TIME:  Approximately 15 minutes with an ankle Esmarch.  COMPLICATIONS:  None apparent.  DISPOSITION:  Extubated, awake, and stable to recovery.  INDICATIONS FOR PROCEDURE:  The patient is a 80 year old male with past medical history significant for diabetes.  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  He has developed an ulcer over the lateral aspect of the hindfoot adjacent to the fifth metatarsal base.  This has not healed despite extensive wound care.  An MRI reveals a focus of osteomyelitis at the base of the fifth metatarsal adjacent to the ulcer as well as abscess in the subcutaneous tissues.  He presents now for operative treatment of this condition.  He understands the risks and benefits, the alternative treatment options, and elects surgical treatment.  He specifically understands the risks of bleeding, infection, nerve damage, blood clots, need for additional surgery, continued pain, recurrent infection, amputation and death.  PROCEDURE IN  DETAIL:  After preoperative consent was obtained and the correct operative site was identified, the patient was brought to the operating room and placed supine on the operating table.  General anesthesia was induced.  Preoperative antibiotics were held pending cultures.  Surgical time-out was taken.  The right lower extremity was prepped and draped in standard sterile fashion.  An Esmarch bandage was used to exsanguinate the foot and wrapped around the ankle as a tourniquet.  The incision was made along the fifth metatarsal base at the location of the ulcer.  The ulcer was excised in its entirety.  A curette and rongeur were used to debride the skin and subcutaneous tissue down to the level of the bone.  Fragment of bone was identified at the base of the fifth metatarsal that was loose.  This was debrided in its entirety.  All of the soft tissue was sent as a specimen to Microbiology for aerobic and anaerobic culture.  IV vancomycin was then administered.  Tourniquet was released.  Wound was irrigated copiously. The wound was then packed with vancomycin powder.  Wound edges were approximated with 2-0 simple stitches.  Sterile dressings were applied followed by a compression wrap.  The patient was awakened from anesthesia and transported to the recovery room in stable condition.  FOLLOWUP PLAN:  The  patient will be weightbearing as tolerated on his right foot and flat postop shoe.  He will follow up with me in the office in 2 weeks and with Dr. Ronda Fairly at the [facility].  The patient will be discharged on doxycycline 100 mg p.o. b.i.d. for 2 weeks.  He will resume all of his other regular medications.  Alfredo Martinez, PA-C was present and scrubbed for the duration of the case.  His assistance was essential in positioning of the patient, prepping and draping, gaining and maintaining exposure, performing the operation, and closing and dressing of the  wounds.     Toni Arthurs, MD     JH/MEDQ  D:  10/28/2015  T:  10/28/2015  Job:  [id number]  cc:   Thomes Lolling, M.D.

## 2015-10-29 ENCOUNTER — Encounter (HOSPITAL_COMMUNITY): Payer: Self-pay | Admitting: Orthopedic Surgery

## 2015-10-30 LAB — TISSUE CULTURE

## 2015-11-03 LAB — ANAEROBIC CULTURE

## 2015-11-24 ENCOUNTER — Ambulatory Visit: Payer: Medicare Other | Admitting: Nurse Practitioner

## 2015-11-24 ENCOUNTER — Other Ambulatory Visit: Payer: Medicare Other

## 2015-11-25 LAB — FUNGUS CULTURE WITH STAIN

## 2015-11-25 LAB — FUNGUS CULTURE RESULT

## 2015-11-25 LAB — FUNGAL ORGANISM REFLEX

## 2015-12-02 ENCOUNTER — Encounter: Payer: Self-pay | Admitting: Nurse Practitioner

## 2015-12-17 ENCOUNTER — Other Ambulatory Visit: Payer: Self-pay

## 2016-04-26 ENCOUNTER — Other Ambulatory Visit: Payer: Self-pay | Admitting: *Deleted

## 2016-05-01 MED ORDER — HYDROCHLOROTHIAZIDE 12.5 MG PO CAPS: ORAL_CAPSULE | ORAL | 0 refills | Status: DC

## 2016-05-18 ENCOUNTER — Encounter: Payer: Medicare Other | Admitting: Internal Medicine

## 2016-05-29 ENCOUNTER — Other Ambulatory Visit: Payer: Self-pay | Admitting: Internal Medicine

## 2016-06-01 ENCOUNTER — Encounter: Payer: Self-pay | Admitting: Internal Medicine

## 2016-06-01 ENCOUNTER — Ambulatory Visit (INDEPENDENT_AMBULATORY_CARE_PROVIDER_SITE_OTHER): Payer: Medicare Other | Admitting: Internal Medicine

## 2016-06-01 VITALS — BP 117/46 | HR 50 | Ht 75.0 in | Wt 154.8 lb

## 2016-06-01 DIAGNOSIS — I10 Essential (primary) hypertension: Secondary | ICD-10-CM

## 2016-06-01 MED ORDER — LISINOPRIL 20 MG PO TABS: 20.0000 mg | ORAL_TABLET | Freq: Two times a day (BID) | ORAL | 1 refills | Status: DC

## 2016-06-01 NOTE — Patient Instructions (Signed)
Medication Instructions:  Stop metoprolol.  Decrease lisinopril to 20 mg two times a day  Labwork: BMET/CBCd/Lipid profile--Dr Tenny Craw has given you an order for this-please fax the results to [phone].  Testing/Procedures: none  Follow-Up: Your physician wants you to follow-up in: April 2018 with Dr Tenny Craw.You will receive a reminder letter in the mail two months in advance. If you don't receive a letter, please call our office to schedule the follow-up appointment.   Any Other Special Instructions Will Be Listed Below (If Applicable).  Your physician has requested that you regularly monitor and record your blood pressure readings at home. Please use the same machine at the same time of day to check your readings and record them. Send the readings in to Dr Tenny Craw through [facility].     If you need a refill on your cardiac medications before your next appointment, please call your pharmacy.

## 2016-06-01 NOTE — Progress Notes (Signed)
Cardiology Office Note   Date:  06/01/2016   ID:  [name], DOB 28-Jun-1936, MRN [id number]  PCP:  Joycelyn Rua, MD  Cardiologist:   Dietrich Pates, MD    Continued f/u of HTN     History of Present Illness: [name] is a 80 y.o. male with a history of HTN and anxiety  He was previously followed by T Brackbill.  Seen in December 2016  Also a history of symptomatic PVCs and RA. Echo in May 2015 LVEF normal  Mild AI Since seen the pt has had continued problems with back pain   The pt denies SOB  No CP  No dizziness         Outpatient Medications Prior to Visit  Medication Sig Dispense Refill  . amLODipine (NORVASC) 10 MG tablet TAKE 1 TABLET (10 MG TOTAL) BY MOUTH EVERY MORNING. 90 tablet 3  . Calcium Citrate-Vitamin D (CITRACAL + D PO) Take 1 tablet by mouth daily.     . Cholecalciferol (VITAMIN D) 2000 UNITS CAPS Take 6,000 Units by mouth daily.    . cyanocobalamin (,VITAMIN B-12,) 1000 MCG/ML injection INJECT INTO the MUSCLE MONTHLY  6  . fentaNYL (DURAGESIC - DOSED MCG/HR) 25 MCG/HR patch Place 25 mcg onto the skin every other day.     . hydrochlorothiazide (MICROZIDE) 12.5 MG capsule TAKE 1 CAPSULE (12.5 MG TOTAL) BY MOUTH DAILY. 30 capsule 0  . HYDROcodone-acetaminophen (NORCO) 10-325 MG tablet Take 1 tablet by mouth every 4 (four) hours as needed for moderate pain. 30 tablet 0  . leflunomide (ARAVA) 20 MG tablet Take 20 mg by mouth every morning.     Marland Kitchen lisinopril (PRINIVIL,ZESTRIL) 20 MG tablet Take 3 tablets (60 mg total) by mouth daily. 270 tablet 3  . LYRICA 75 MG capsule Take 75 mg by mouth daily.   0  . metFORMIN (GLUCOPHAGE) 500 MG tablet Take 500 mg by mouth 2 (two) times daily.     . methocarbamol (ROBAXIN) 500 MG tablet Take 500 mg by mouth 2 (two) times daily as needed for muscle spasms.   2  . metoprolol succinate (TOPROL-XL) 25 MG 24 hr tablet Take 0.5 tablets (12.5 mg total) by mouth daily. 45 tablet 3  . Multiple Vitamin (MULTIVITAMIN WITH  MINERALS) TABS Take 1 tablet by mouth daily.     . potassium chloride SA (K-DUR,KLOR-CON) 20 MEQ tablet Take 1 tablet (20 mEq total) by mouth daily. 90 tablet 3  . predniSONE (DELTASONE) 5 MG tablet Take 5 mg by mouth every morning.     . temazepam (RESTORIL) 30 MG capsule Take 1 capsule (30 mg total) by mouth at bedtime as needed for sleep. 90 capsule 0  . docusate sodium (COLACE) 100 MG capsule Take 1 capsule (100 mg total) by mouth 2 (two) times daily. While taking narcotic pain medicine. (Patient not taking: Reported on 06/01/2016) 30 capsule 0  . doxycycline (VIBRAMYCIN) 50 MG capsule Take 2 capsules (100 mg total) by mouth 2 (two) times daily. (Patient not taking: Reported on 06/01/2016) 28 capsule 0  . senna (SENOKOT) 8.6 MG TABS tablet Take 2 tablets (17.2 mg total) by mouth 2 (two) times daily. (Patient not taking: Reported on 06/01/2016) 30 each 0  . triamcinolone (NASACORT ALLERGY 24HR) 55 MCG/ACT AERO nasal inhaler Place 1 spray into the nose daily as needed (allergies).     No facility-administered medications prior to visit.      Allergies:   Sulfa drugs cross reactors;  Cephalexin; Doxycycline; and Sulfa antibiotics   Past Medical History:  Diagnosis Date  . Anemia   . Anxiety   . Aortic valve disease    SCLEROSIS WITHOUT STENOSIS  . Bladder stone   . BPH (benign prostatic hyperplasia)   . Chronic low back pain    CHRONIC NARCOTIC USE  . Coronary artery disease CARDOLOGIST- DR BRACKBILL   cardiac catheterization 2005 / EF 50% - Pt not aware of this  . DDD (degenerative disc disease)   . Diabetes mellitus, type 2 (HCC)   . Fatigue   . H/O steroid therapy    uses for Rheumatoid arthritis  . Hyperlipidemia   . Hypertension    dr Patty Sermons  . PVC's (premature ventricular contractions)   . RA (rheumatoid arthritis) (HCC) RHEUMATOLOGIST-  DR Dareen Piano   SEVERE  . Sciatica   . Systolic murmur     Past Surgical History:  Procedure Laterality Date  . ANKLE ARTHRODESIS  Right 01-11-2011  . ANTERIOR CERVICAL DECOMP/DISCECTOMY FUSION N/A 04/16/2013   Procedure: L4 - L5 REVISION DECOMPRESSION AND FUSION 1 LEVEL;  Surgeon: Venita Lick, MD;  Location: [facility];  Service: Orthopedics;  Laterality: N/A;  . CARDIAC CATHETERIZATION  2005   EF 50% /  showed minimal coronary atherosclerosisi   . CARPAL TUNNEL RELEASE Right 09-24-2006  . CYSTOSCOPY WITH LITHOLAPAXY N/A 06/29/2015   Procedure: CYSTOSCOPY WITH LITHOLAPAXY;  Surgeon: Hildred Laser, MD;  Location: [facility];  Service: Urology;  Laterality: N/A;  . DECOMPRESSIVE LUMBAR LAMINECTOMY LEVEL 3  07/17/2012   Procedure: DECOMPRESSIVE LUMBAR LAMINECTOMY LEVEL 3;  Surgeon: Drucilla Schmidt, MD;  Location: [facility];  Service: Orthopedics;  Laterality: N/A;  L3-4, L4-5 AND L5-S1  . HAMMER TOE SURGERY     RIGHT SECOND TOE  . HARDWARE REMOVAL Right 10/17/2012   Procedure: HARDWARE REMOVAL;  Surgeon: Drucilla Schmidt, MD;  Location: [facility];  Service: Orthopedics;  Laterality: Right;  REMOVE SCREW RIGHT ANKLE  . HOLMIUM LASER APPLICATION N/A 06/29/2015   Procedure: HOLMIUM LASER APPLICATION;  Surgeon: Hildred Laser, MD;  Location: [facility];  Service: Urology;  Laterality: N/A;  . I&D EXTREMITY Right 10/28/2015   Procedure: IRRIGATION AND DEBRIDEMENT OF RIGHT FOOT AND EXOSTECTOMY ;  Surgeon: Toni Arthurs, MD;  Location: [facility];  Service: Orthopedics;  Laterality: Right;  . LEFT INDEX / LONG FINGERS I & D , REPAIR TENDON, AND PINNING  09-18-2010  . LUMBAR SPINE SURGERY  10-07-2008   DECOMPRESSION   L2  - S1  . OPEN REPAIR LEFT QUADRICEPS TENDON  11-17-2007  . RETINAL DETACHMENT SURGERY Left 2001  . RIGHT HYDROCELECTOMY/ SPERMATOCELECTOMY  09-11-2001  . TRANSTHORACIC ECHOCARDIOGRAM  06-13-2012  DR BRACKBILL   MILD LVH/ LVSF NORMAL/ EF 55-60%/ GRADE I DIASTOLIC DYSFUNCTION/  MILD AV AND MV REGURG/ AORTIC VALVE SCLEROSIS WITHOUT STENOSIS  . TRANSURETHRAL RESECTION OF PROSTATE N/A 06/29/2015   Procedure:  TRANSURETHRAL RESECTION OF THE PROSTATE ;  Surgeon: Hildred Laser, MD;  Location: [facility];  Service: Urology;  Laterality: N/A;     Social History:  The patient  reports that he has never smoked. He has never used smokeless tobacco. He reports that he does not drink alcohol or use drugs.   Family History:  The patient's family history includes Heart disease in his father.    ROS:  Please see the history of present illness. All other systems are reviewed and  Negative to the above problem except as noted.  PHYSICAL EXAM: VS:  BP (!) 117/46   Pulse (!) 50   Ht 6\' 3"  (1.905 m)   Wt 154 lb 12.8 oz (70.2 kg)   BMI 19.35 kg/m   GEN: Well nourished, well developed, in no acute distress  HEENT: normal  Neck: no JVD, carotid bruits, or masses Cardiac: RRR; Gr II/VI systolic murmur  Gr I/Vi diastolic murmur LSb   rubs, or gallops,no edema  Respiratory:  clear to auscultation bilaterally, normal work of breathing GI: soft, nontender, nondistended, + BS  No hepatomegaly  MS: no deformity Moving all extremities   Skin: warm and dry, no rash Neuro:  Strength and sensation are intact Psych: euthymic mood, full affect   EKG:  EKG is not  ordered today.   Lipid Panel    Component Value Date/Time   CHOL 194 01/15/2015 1124   TRIG 336.0 (H) 01/15/2015 1124   HDL 37.60 (L) 01/15/2015 1124   CHOLHDL 5 01/15/2015 1124   VLDL 67.2 (H) 01/15/2015 1124   LDLCALC 96 04/21/2014 1602   LDLDIRECT 92.0 01/15/2015 1124      Wt Readings from Last 3 Encounters:  06/01/16 154 lb 12.8 oz (70.2 kg)  10/28/15 175 lb (79.4 kg)  07/26/15 179 lb 12.8 oz (81.6 kg)      ASSESSMENT AND PLAN:  1  HTN  BP is well controlled  Pt wants to simplify meds  I think he can stop metoprolol  Cut back on lisinopril to 20 bid   Keep log of BP and email response    2  RA  Continued pain  3  PVCs  Follow  Pt denies palpitatioins    F/U in the spring     Current medicines are reviewed at length with the  patient today.  The patient does not have concerns regarding medicines.  Signed, 03-05-1991, MD  06/01/2016 5:04 PM    [facility] [address] [place], [place], [place]  [place] Phone: [phone]; Fax: [phone]

## 2016-06-23 ENCOUNTER — Other Ambulatory Visit: Payer: Self-pay | Admitting: Internal Medicine

## 2016-06-23 DIAGNOSIS — I119 Hypertensive heart disease without heart failure: Secondary | ICD-10-CM

## 2016-06-23 NOTE — Telephone Encounter (Signed)
Previously filled by Dr Patty Sermons. Okay to refill under Dr Tenny Craw? Im not sure why the request has Dr Graciela Husbands. Please advise. Thanks, MI

## 2016-06-26 NOTE — Telephone Encounter (Signed)
Please refill from Dr. Tenny Craw.

## 2016-06-27 ENCOUNTER — Other Ambulatory Visit: Payer: Self-pay | Admitting: Internal Medicine

## 2016-08-09 ENCOUNTER — Telehealth: Payer: Self-pay | Admitting: *Deleted

## 2016-08-09 NOTE — Telephone Encounter (Signed)
CALLED TO INFORM THE PHARMACY THAT THE METOPROLOL WAS D/C'D, SPOKE WITH KATY, THEY CORRECTED HIS PROFILE THERE AT THE PHARMACY

## 2016-08-15 ENCOUNTER — Other Ambulatory Visit: Payer: Self-pay

## 2016-08-15 MED ORDER — AMLODIPINE BESYLATE 10 MG PO TABS: ORAL_TABLET | ORAL | 3 refills | Status: DC

## 2016-11-27 ENCOUNTER — Other Ambulatory Visit: Payer: Self-pay | Admitting: Internal Medicine

## 2016-11-27 MED ORDER — LISINOPRIL 20 MG PO TABS: 20.0000 mg | ORAL_TABLET | Freq: Two times a day (BID) | ORAL | 1 refills | Status: DC

## 2017-05-02 ENCOUNTER — Other Ambulatory Visit: Payer: Self-pay | Admitting: Internal Medicine

## 2017-05-08 ENCOUNTER — Other Ambulatory Visit: Payer: Self-pay | Admitting: Internal Medicine

## 2017-05-08 NOTE — Telephone Encounter (Signed)
Medication Detail    Disp Refills Start End   lisinopril (PRINIVIL,ZESTRIL) 20 MG tablet 60 tablet 0 05/02/2017 07/31/2017   Sig - Route: Take 1 tablet (20 mg total) by mouth 2 (two) times daily. Patient needs to call and schedule an appointment for further refills 1st attempt - Oral   Sent to pharmacy as: lisinopril (PRINIVIL,ZESTRIL) 20 MG tablet   E-Prescribing Status: Receipt confirmed by pharmacy (05/02/2017 12:19 PM EDT)   Pharmacy   [facility] [phone] - [place], [place] - [address]

## 2017-10-29 ENCOUNTER — Other Ambulatory Visit: Payer: Self-pay | Admitting: Internal Medicine

## 2017-11-03 ENCOUNTER — Other Ambulatory Visit: Payer: Self-pay | Admitting: Internal Medicine

## 2017-11-05 NOTE — Telephone Encounter (Signed)
Outpatient Medication Detail    Disp Refills Start End   amLODipine (NORVASC) 10 MG tablet 30 tablet 0 10/30/2017    Sig: TAKE 1 TABLET (10 MG TOTAL) BY MOUTH EVERY MORNING.   Sent to pharmacy as: amLODipine (NORVASC) 10 MG tablet   Notes to Pharmacy: Please call [phone] to set up follow up with Dr. Tenny Craw for further refills. Thank you. 1st attempt.   E-Prescribing Status: Receipt confirmed by pharmacy (10/30/2017 9:19 AM EDT)   Pharmacy   [facility] [phone] - [place], [place] - [address]

## 2017-11-11 ENCOUNTER — Other Ambulatory Visit: Payer: Self-pay | Admitting: Internal Medicine

## 2017-11-11 DIAGNOSIS — I119 Hypertensive heart disease without heart failure: Secondary | ICD-10-CM

## 2017-11-30 ENCOUNTER — Other Ambulatory Visit: Payer: Self-pay | Admitting: Internal Medicine

## 2017-12-06 ENCOUNTER — Telehealth: Payer: Self-pay

## 2017-12-06 NOTE — Telephone Encounter (Signed)
Got faxed refill request for patient for Klor Con 20, he has not been seen for over 1 year, called patient to see if he would schedule follow up appointment. Patient stated he is not a patient here and has no doctors that are male, he stated he would not be making an appointment and does not need refill from [place].

## 2018-01-15 ENCOUNTER — Other Ambulatory Visit: Payer: Self-pay | Admitting: Internal Medicine

## 2018-01-15 DIAGNOSIS — I119 Hypertensive heart disease without heart failure: Secondary | ICD-10-CM
